# Patient Record
Sex: Male | Born: 1950 | Race: White | Hispanic: No | Marital: Married | Smoking: Former smoker
Health system: Southern US, Community
[De-identification: ages and names within clinical notes are randomized; demographics above are authoritative.]

## PROBLEM LIST (undated history)

## (undated) DIAGNOSIS — I10 Essential (primary) hypertension: Secondary | ICD-10-CM

## (undated) HISTORY — DX: Essential (primary) hypertension: I10

---

## 2011-09-04 LAB — HM COLONOSCOPY

## 2012-08-04 ENCOUNTER — Ambulatory Visit (INDEPENDENT_AMBULATORY_CARE_PROVIDER_SITE_OTHER): Payer: 59 | Admitting: Family Medicine

## 2012-08-04 VITALS — BP 122/80 | HR 56 | Temp 98.2°F | Resp 16 | Ht 69.0 in | Wt 215.8 lb

## 2012-08-04 DIAGNOSIS — H109 Unspecified conjunctivitis: Secondary | ICD-10-CM

## 2012-08-04 MED ORDER — OFLOXACIN 0.3 % OP SOLN: 1.0000 [drp] | Freq: Four times a day (QID) | OPHTHALMIC | Status: AC

## 2012-08-04 MED ORDER — OFLOXACIN 0.3 % OP SOLN: 1.0000 [drp] | Freq: Four times a day (QID) | OPHTHALMIC | Status: DC

## 2012-08-04 NOTE — Progress Notes (Signed)
Patient ID: Billy Fernandez, male   DOB: 1951-09-27, 61 y.o.   MRN: [id number] Billy Fernandez is a 61 y.o. male who presents to Urgent Care today for Red eye:  1.  Left eye redness:  Patient awoke this morning with pain in his Left eye.  Feeling "gritty" sensation in Left eye.  No eye pain.  No blurry vision.  Noticed redness that increased as day progressed.  No purulence that he has noticed, has had som clear drainage.  No injury to eye.  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    No recent illnesses.  Has history of pterygium removal at age 26.  Sees eye doctor regularly every year and tests negative for glaucoma.  Vision in Left eye is 20/50 at baseline and has been so for years.     PMH reviewed.  ROS as above otherwise neg.  No chest pain, palpitations, SOB, Fever, Chills, Abd pain, N/V/D.  Medications reviewed. Current Outpatient Prescriptions  Medication Sig Dispense Refill  . aspirin 81 MG tablet Take 81 mg by mouth daily.      Marland Kitchen atorvastatin (LIPITOR) 20 MG tablet Take 20 mg by mouth daily.      . fish oil-omega-3 fatty acids 1000 MG capsule Take 2 g by mouth daily.      . nebivolol (BYSTOLIC) 10 MG tablet Take 10 mg by mouth daily.      . valsartan (DIOVAN) 160 MG tablet Take 160 mg by mouth daily.      Marland Kitchen ofloxacin (OCUFLOX) 0.3 % ophthalmic solution Place 1 drop into the left eye 4 (four) times daily.  5 mL  0    Exam:  BP 122/80  Pulse 56  Temp 98.2 F (36.8 C) (Oral)  Resp 16  Ht 5\' 9"  (1.753 m)  Wt 215 lb 12.8 oz (97.886 kg)  BMI 31.87 kg/m2  SpO2 96% Gen:  Patient sitting on exam table, appears stated age in no acute distress Head: Normocephalic atraumatic Eyes: Right eye WNL, no redness noted.  PERRL, EOMI BL.  Left eye with erythema noted lateral aspect of eye.  Peri-limbal sparing.  VIsion test noted.  Concretions noted BL lower conjunctiva Nose:  Nasal turbinates grossly enlarged bilaterally. Some exudates noted. Tender to palpation of maxillary  sinus  Ears:  No pre-auricular nodes noted Mouth: Mucosa membranes moist. Tonsils +2, nonenlarged, non-erythematous. Neck: No cervical lymphadenopathy noted   Eye Exam:  Fluorescein examination negative for any foreign body or corneal abrasion.  Performed with Dr. Perrin Maltese.    Assessment and Plan:  1.  Conjunctivitis:  Likely viral conjunctivitis, although bacterial also a possibility.  Will cover with ofloxacin.  Instructions provided.  Not likely glaucoma due to no history, no eye pain, no changes in vision.  FU in 48 eyes with [place] or his eye doctor if no improvement, FU sooner if worsening.

## 2012-08-04 NOTE — Patient Instructions (Addendum)
You have conjunctivitis -- unclear at this time whether viral or bacterial. Insert 1-2 drops every 4 hours in your Left eye for the next 2 days, then 4 times a day for 5 days after that.  If you do not have any improvement in the next 48 hours come back and see [place] or go and see your eye doctor.    Conjunctivitis Conjunctivitis is commonly called "pink eye." Conjunctivitis can be caused by bacterial or viral infection, allergies, or injuries. There is usually redness of the lining of the eye, itching, discomfort, and sometimes discharge. There may be deposits of matter along the eyelids. A viral infection usually causes a watery discharge, while a bacterial infection causes a yellowish, thick discharge. Pink eye is very contagious and spreads by direct contact. You may be given antibiotic eyedrops as part of your treatment. Before using your eye medicine, remove all drainage from the eye by washing gently with warm water and cotton balls. Continue to use the medication until you have awakened 2 mornings in a row without discharge from the eye. Do not rub your eye. This increases the irritation and helps spread infection. Use separate towels from other household members. Wash your hands with soap and water before and after touching your eyes. Use cold compresses to reduce pain and sunglasses to relieve irritation from light. Do not wear contact lenses or wear eye makeup until the infection is gone. SEEK MEDICAL CARE IF:   Your symptoms are not better after 3 days of treatment.   You have increased pain or trouble seeing.   The outer eyelids become very red or swollen.  Document Released: 01/16/2005 Document Revised: 11/28/2011 Document Reviewed: 12/09/2005 [facility] Patient Information 2012 [place], [place].

## 2013-05-04 ENCOUNTER — Telehealth: Payer: Self-pay | Admitting: Internal Medicine

## 2013-05-04 NOTE — Telephone Encounter (Signed)
Received 16 pages from [facility], sent to Dr. Yetta Barre. 05/04/13/ss

## 2013-05-25 ENCOUNTER — Telehealth: Payer: Self-pay | Admitting: Internal Medicine

## 2013-05-25 NOTE — Telephone Encounter (Signed)
Rec'd from [facility] forward 43 pages to Dr.Jones

## 2013-05-28 ENCOUNTER — Other Ambulatory Visit (INDEPENDENT_AMBULATORY_CARE_PROVIDER_SITE_OTHER): Payer: BC Managed Care – PPO

## 2013-05-28 ENCOUNTER — Ambulatory Visit (INDEPENDENT_AMBULATORY_CARE_PROVIDER_SITE_OTHER): Payer: BC Managed Care – PPO | Admitting: Internal Medicine

## 2013-05-28 ENCOUNTER — Encounter: Payer: Self-pay | Admitting: Internal Medicine

## 2013-05-28 VITALS — BP 118/76 | HR 98 | Temp 98.1°F | Resp 16 | Ht 70.0 in | Wt 196.0 lb

## 2013-05-28 DIAGNOSIS — Z0000 Encounter for general adult medical examination without abnormal findings: Secondary | ICD-10-CM

## 2013-05-28 DIAGNOSIS — I10 Essential (primary) hypertension: Secondary | ICD-10-CM

## 2013-05-28 DIAGNOSIS — R6882 Decreased libido: Secondary | ICD-10-CM

## 2013-05-28 HISTORY — DX: Essential (primary) hypertension: I10

## 2013-05-28 LAB — LIPID PANEL
Cholesterol: 131 mg/dL (ref 0–200)
Triglycerides: 113 mg/dL (ref 0.0–149.0)
VLDL: 22.6 mg/dL (ref 0.0–40.0)

## 2013-05-28 LAB — CBC WITH DIFFERENTIAL/PLATELET
Basophils Relative: 0.7 % (ref 0.0–3.0)
Eosinophils Absolute: 0.7 10*3/uL (ref 0.0–0.7)
MCHC: 33.9 g/dL (ref 30.0–36.0)
MCV: 89.4 fl (ref 78.0–100.0)
Monocytes Absolute: 0.6 10*3/uL (ref 0.1–1.0)
Neutro Abs: 3.8 10*3/uL (ref 1.4–7.7)
Neutrophils Relative %: 53.4 % (ref 43.0–77.0)
RBC: 4.69 Mil/uL (ref 4.22–5.81)

## 2013-05-28 LAB — COMPREHENSIVE METABOLIC PANEL
AST: 24 U/L (ref 0–37)
Alkaline Phosphatase: 44 U/L (ref 39–117)
BUN: 19 mg/dL (ref 6–23)
Creatinine, Ser: 1 mg/dL (ref 0.4–1.5)
Potassium: 4.2 mEq/L (ref 3.5–5.1)

## 2013-05-28 LAB — VITAMIN B12: Vitamin B-12: 296 pg/mL (ref 211–911)

## 2013-05-28 LAB — TSH: TSH: 0.59 u[IU]/mL (ref 0.35–5.50)

## 2013-05-28 NOTE — Assessment & Plan Note (Signed)
His BP is well controlled 

## 2013-05-28 NOTE — Assessment & Plan Note (Signed)
Exam done Vaccines were reviewed Labs ordered Pt ed material was given 

## 2013-05-28 NOTE — Patient Instructions (Addendum)
Health Maintenance, Males A healthy lifestyle and preventative care can promote health and wellness.  Maintain regular health, dental, and eye exams.  Eat a healthy diet. Foods like vegetables, fruits, whole grains, low-fat dairy products, and lean protein foods contain the nutrients you need without too many calories. Decrease your intake of foods high in solid fats, added sugars, and salt. Get information about a proper diet from your caregiver, if necessary.  Regular physical exercise is one of the most important things you can do for your health. Most adults should get at least 150 minutes of moderate-intensity exercise (any activity that increases your heart rate and causes you to sweat) each week. In addition, most adults need muscle-strengthening exercises on 2 or more days a week.   Maintain a healthy weight. The body mass index (BMI) is a screening tool to identify possible weight problems. It provides an estimate of body fat based on height and weight. Your caregiver can help determine your BMI, and can help you achieve or maintain a healthy weight. For adults 20 years and older:  A BMI below 18.5 is considered underweight.  A BMI of 18.5 to 24.9 is normal.  A BMI of 25 to 29.9 is considered overweight.  A BMI of 30 and above is considered obese.  Maintain normal blood lipids and cholesterol by exercising and minimizing your intake of saturated fat. Eat a balanced diet with plenty of fruits and vegetables. Blood tests for lipids and cholesterol should begin at age 20 and be repeated every 5 years. If your lipid or cholesterol levels are high, you are over 50, or you are a high risk for heart disease, you may need your cholesterol levels checked more frequently.Ongoing high lipid and cholesterol levels should be treated with medicines, if diet and exercise are not effective.  If you smoke, find out from your caregiver how to quit. If you do not use tobacco, do not start.  If you  choose to drink alcohol, do not exceed 2 drinks per day. One drink is considered to be 12 ounces (355 mL) of beer, 5 ounces (148 mL) of wine, or 1.5 ounces (44 mL) of liquor.  Avoid use of street drugs. Do not share needles with anyone. Ask for help if you need support or instructions about stopping the use of drugs.  High blood pressure causes heart disease and increases the risk of stroke. Blood pressure should be checked at least every 1 to 2 years. Ongoing high blood pressure should be treated with medicines if weight loss and exercise are not effective.  If you are 45 to 61 years old, ask your caregiver if you should take aspirin to prevent heart disease.  Diabetes screening involves taking a blood sample to check your fasting blood sugar level. This should be done once every 3 years, after age 45, if you are within normal weight and without risk factors for diabetes. Testing should be considered at a younger age or be carried out more frequently if you are overweight and have at least 1 risk factor for diabetes.  Colorectal cancer can be detected and often prevented. Most routine colorectal cancer screening begins at the age of 50 and continues through age 75. However, your caregiver may recommend screening at an earlier age if you have risk factors for colon cancer. On a yearly basis, your caregiver may provide home test kits to check for hidden blood in the stool. Use of a small camera at the end of a tube,   to directly examine the colon (sigmoidoscopy or colonoscopy), can detect the earliest forms of colorectal cancer. Talk to your caregiver about this at age 50, when routine screening begins. Direct examination of the colon should be repeated every 5 to 10 years through age 75, unless early forms of pre-cancerous polyps or small growths are found.  Hepatitis C blood testing is recommended for all people born from 1945 through 1965 and any individual with known risks for hepatitis C.  Healthy  men should no longer receive prostate-specific antigen (PSA) blood tests as part of routine cancer screening. Consult with your caregiver about prostate cancer screening.  Testicular cancer screening is not recommended for adolescents or adult males who have no symptoms. Screening includes self-exam, caregiver exam, and other screening tests. Consult with your caregiver about any symptoms you have or any concerns you have about testicular cancer.  Practice safe sex. Use condoms and avoid high-risk sexual practices to reduce the spread of sexually transmitted infections (STIs).  Use sunscreen with a sun protection factor (SPF) of 30 or greater. Apply sunscreen liberally and repeatedly throughout the day. You should seek shade when your shadow is shorter than you. Protect yourself by wearing long sleeves, pants, a wide-brimmed hat, and sunglasses year round, whenever you are outdoors.  Notify your caregiver of new moles or changes in moles, especially if there is a change in shape or color. Also notify your caregiver if a mole is larger than the size of a pencil eraser.  A one-time screening for abdominal aortic aneurysm (AAA) and surgical repair of large AAAs by sound wave imaging (ultrasonography) is recommended for ages 65 to 75 years who are current or former smokers.  Stay current with your immunizations. Document Released: 06/06/2008 Document Revised: 03/02/2012 Document Reviewed: 05/06/2011 [employer] Patient Information 2014 [employer].  

## 2013-05-28 NOTE — Assessment & Plan Note (Signed)
Labs ordered.

## 2013-05-28 NOTE — Progress Notes (Signed)
  Subjective:    Patient ID: Billy Fernandez, male    DOB: [birth year], 62 y.o.   MRN: [id number]  HPI  New to me for a complete physical - his only complaint is low libido, he wants his T level checked.  Review of Systems  Constitutional: Negative.  Negative for fever, chills, diaphoresis, activity change, appetite change, fatigue and unexpected weight change.  HENT: Negative.   Eyes: Negative.   Respiratory: Negative.  Negative for apnea, cough, chest tightness, shortness of breath, wheezing and stridor.   Cardiovascular: Negative.  Negative for chest pain, palpitations and leg swelling.  Gastrointestinal: Negative.  Negative for nausea, vomiting, abdominal pain, diarrhea, constipation and blood in stool.  Endocrine: Negative.   Genitourinary: Negative.   Musculoskeletal: Negative for myalgias, back pain, joint swelling, arthralgias and gait problem.  Skin: Negative for color change, pallor, rash and wound.  Allergic/Immunologic: Negative.   Neurological: Negative.  Negative for dizziness and light-headedness.  Hematological: Negative.  Negative for adenopathy. Does not bruise/bleed easily.  Psychiatric/Behavioral: Negative.        Objective:   Physical Exam  Vitals reviewed. Constitutional: He is oriented to person, place, and time. He appears well-developed and well-nourished. No distress.  HENT:  Head: Normocephalic and atraumatic.  Mouth/Throat: Oropharynx is clear and moist. No oropharyngeal exudate.  Eyes: Conjunctivae are normal. Right eye exhibits no discharge. Left eye exhibits no discharge. No scleral icterus.  Neck: Normal range of motion. Neck supple. No JVD present. No tracheal deviation present. No thyromegaly present.  Cardiovascular: Normal rate, regular rhythm, normal heart sounds and intact distal pulses.  Exam reveals no gallop and no friction rub.   No murmur heard. Pulmonary/Chest: Effort normal and breath sounds normal. No stridor. No respiratory distress.  He has no wheezes. He has no rales. He exhibits no tenderness.  Abdominal: Soft. Bowel sounds are normal. He exhibits no distension and no mass. There is no tenderness. There is no rebound and no guarding. Hernia confirmed negative in the right inguinal area and confirmed negative in the left inguinal area.  Genitourinary: Rectum normal, prostate normal, testes normal and penis normal. Rectal exam shows no external hemorrhoid, no internal hemorrhoid, no fissure, no mass, no tenderness and anal tone normal. Guaiac negative stool. Prostate is not enlarged and not tender. Right testis shows no mass, no swelling and no tenderness. Right testis is descended. Left testis shows no mass, no swelling and no tenderness. Left testis is descended. Circumcised. No penile erythema or penile tenderness. No discharge found.  Musculoskeletal: Normal range of motion. He exhibits no edema and no tenderness.  Lymphadenopathy:    He has no cervical adenopathy.       Right: No inguinal adenopathy present.       Left: No inguinal adenopathy present.  Neurological: He is oriented to person, place, and time.  Skin: Skin is warm and dry. No rash noted. He is not diaphoretic. No erythema. No pallor.  Psychiatric: He has a normal mood and affect. His behavior is normal. Judgment and thought content normal.     No results found for this basename: WBC, HGB, HCT, PLT, GLUCOSE, CHOL, TRIG, HDL, LDLDIRECT, LDLCALC, ALT, AST, NA, K, CL, CREATININE, BUN, CO2, TSH, PSA, INR, GLUF, HGBA1C, MICROALBUR       Assessment & Plan:

## 2013-05-29 LAB — HEPATITIS C ANTIBODY: HCV Ab: NEGATIVE

## 2013-05-29 LAB — VITAMIN D 25 HYDROXY (VIT D DEFICIENCY, FRACTURES): Vit D, 25-Hydroxy: 45 ng/mL (ref 30–89)

## 2013-05-31 ENCOUNTER — Encounter: Payer: Self-pay | Admitting: Internal Medicine

## 2013-05-31 LAB — TESTOSTERONE, FREE, TOTAL, SHBG
Sex Hormone Binding: 86 nmol/L — ABNORMAL HIGH (ref 13–71)
Testosterone, Free: 51.8 pg/mL (ref 47.0–244.0)
Testosterone-% Free: 1 % — ABNORMAL LOW (ref 1.6–2.9)

## 2013-06-29 ENCOUNTER — Encounter: Payer: Self-pay | Admitting: Internal Medicine

## 2013-06-29 ENCOUNTER — Ambulatory Visit (INDEPENDENT_AMBULATORY_CARE_PROVIDER_SITE_OTHER): Payer: BC Managed Care – PPO | Admitting: Internal Medicine

## 2013-06-29 VITALS — BP 132/88 | HR 51 | Temp 98.1°F | Resp 16 | Wt 194.5 lb

## 2013-06-29 DIAGNOSIS — L309 Dermatitis, unspecified: Secondary | ICD-10-CM | POA: Insufficient documentation

## 2013-06-29 DIAGNOSIS — E785 Hyperlipidemia, unspecified: Secondary | ICD-10-CM | POA: Insufficient documentation

## 2013-06-29 DIAGNOSIS — L259 Unspecified contact dermatitis, unspecified cause: Secondary | ICD-10-CM

## 2013-06-29 HISTORY — DX: Hyperlipidemia, unspecified: E78.5

## 2013-06-29 MED ORDER — TRIAMCINOLONE ACETONIDE 0.5 % EX CREA: TOPICAL_CREAM | Freq: Three times a day (TID) | CUTANEOUS | Status: DC

## 2013-06-29 MED ORDER — ATORVASTATIN CALCIUM 20 MG PO TABS: 20.0000 mg | ORAL_TABLET | Freq: Every day | ORAL | Status: DC

## 2013-06-29 MED ORDER — CETIRIZINE HCL 10 MG PO TABS: 10.0000 mg | ORAL_TABLET | Freq: Every day | ORAL | Status: DC

## 2013-06-29 NOTE — Progress Notes (Signed)
  Subjective:    Patient ID: Billy Fernandez, male    DOB: Jun 29, 1951, 62 y.o.   MRN: [id number]  Rash This is a new problem. The current episode started 1 to 4 weeks ago. The problem is unchanged. The affected locations include the torso. The rash is characterized by peeling, itchiness and dryness. He was exposed to nothing. Pertinent negatives include no anorexia, congestion, cough, diarrhea, eye pain, facial edema, fatigue, fever, joint pain, nail changes, rhinorrhea, shortness of breath or vomiting. Past treatments include nothing. The treatment provided no relief.      Review of Systems  Constitutional: Negative.  Negative for fever and fatigue.  HENT: Negative.  Negative for congestion and rhinorrhea.   Eyes: Negative for pain.  Respiratory: Negative for cough, chest tightness and shortness of breath.   Cardiovascular: Negative.   Gastrointestinal: Negative.  Negative for vomiting, abdominal pain, diarrhea and anorexia.  Endocrine: Negative.   Genitourinary: Negative.   Musculoskeletal: Negative.  Negative for joint pain.  Skin: Positive for rash. Negative for nail changes, color change, pallor and wound.  Allergic/Immunologic: Negative.   Neurological: Negative.   Hematological: Negative.   Psychiatric/Behavioral: Negative.        Objective:   Physical Exam  Vitals reviewed. Constitutional: He appears well-developed and well-nourished. No distress.  HENT:  Head: Normocephalic and atraumatic.  Mouth/Throat: No oropharyngeal exudate.  Eyes: Conjunctivae are normal. Right eye exhibits no discharge. Left eye exhibits no discharge. No scleral icterus.  Neck: Normal range of motion. Neck supple. No JVD present. No tracheal deviation present. No thyromegaly present.  Cardiovascular: Normal rate, regular rhythm, normal heart sounds and intact distal pulses.  Exam reveals no gallop and no friction rub.   No murmur heard. Pulmonary/Chest: Effort normal and breath sounds normal. No  stridor. No respiratory distress. He has no wheezes. He has no rales. He exhibits no tenderness.  Abdominal: Soft. Bowel sounds are normal. He exhibits no distension and no mass. There is no tenderness. There is no rebound and no guarding.  Musculoskeletal: Normal range of motion. He exhibits no edema and no tenderness.  Lymphadenopathy:    He has no cervical adenopathy.  Skin: Skin is warm, dry and intact. Rash noted. No abrasion, no bruising, no burn, no ecchymosis, no laceration, no lesion, no petechiae and no purpura noted. Rash is papular. Rash is not macular, not maculopapular, not nodular, not pustular, not vesicular and not urticarial. He is not diaphoretic. No cyanosis or erythema. No pallor. Nails show no clubbing.     The background skin shows diffuse excessive tan with mild sunburn and peeling.  Psychiatric: He has a normal mood and affect. His behavior is normal. Judgment and thought content normal.     Lab Results  Component Value Date   WBC 7.2 05/28/2013   HGB 14.2 05/28/2013   HCT 42.0 05/28/2013   PLT 183.0 05/28/2013   GLUCOSE 115* 05/28/2013   CHOL 131 05/28/2013   TRIG 113.0 05/28/2013   HDL 44.50 05/28/2013   LDLCALC 64 05/28/2013   ALT 25 05/28/2013   AST 24 05/28/2013   NA 141 05/28/2013   K 4.2 05/28/2013   CL 107 05/28/2013   CREATININE 1.0 05/28/2013   BUN 19 05/28/2013   CO2 29 05/28/2013   TSH 0.59 05/28/2013   HGBA1C 5.7 05/28/2013       Assessment & Plan:

## 2013-06-29 NOTE — Patient Instructions (Signed)

## 2013-06-30 ENCOUNTER — Encounter: Payer: Self-pay | Admitting: Internal Medicine

## 2013-06-30 NOTE — Assessment & Plan Note (Signed)
He is doing well on lipitor 

## 2013-06-30 NOTE — Assessment & Plan Note (Signed)
I have asked him to avoid excessive sun exposure Will treat the rash and allergy with zyrtec and TAC cream

## 2013-11-26 ENCOUNTER — Ambulatory Visit (INDEPENDENT_AMBULATORY_CARE_PROVIDER_SITE_OTHER): Payer: BC Managed Care – PPO | Admitting: Internal Medicine

## 2013-11-26 ENCOUNTER — Other Ambulatory Visit (INDEPENDENT_AMBULATORY_CARE_PROVIDER_SITE_OTHER): Payer: BC Managed Care – PPO

## 2013-11-26 ENCOUNTER — Encounter: Payer: Self-pay | Admitting: Internal Medicine

## 2013-11-26 VITALS — BP 120/80 | HR 68 | Temp 97.8°F | Resp 16 | Ht 70.0 in | Wt 191.0 lb

## 2013-11-26 DIAGNOSIS — I10 Essential (primary) hypertension: Secondary | ICD-10-CM

## 2013-11-26 LAB — BASIC METABOLIC PANEL
BUN: 19 mg/dL (ref 6–23)
Calcium: 9.4 mg/dL (ref 8.4–10.5)
GFR: 82.42 mL/min (ref >60.00)
Glucose, Bld: 158 mg/dL — ABNORMAL HIGH (ref 70–99)

## 2013-11-26 MED ORDER — CARVEDILOL 6.25 MG PO TABS: 6.2500 mg | ORAL_TABLET | Freq: Two times a day (BID) | ORAL | Status: DC

## 2013-11-26 MED ORDER — LOSARTAN POTASSIUM-HCTZ 50-12.5 MG PO TABS: 1.0000 | ORAL_TABLET | Freq: Every day | ORAL | Status: DC

## 2013-11-26 NOTE — Patient Instructions (Signed)

## 2013-11-26 NOTE — Progress Notes (Signed)
Pre visit review using our clinic review tool, if applicable. No additional management support is needed unless otherwise documented below in the visit note. 

## 2013-11-26 NOTE — Progress Notes (Signed)
   Subjective:    Patient ID: Billy Fernandez, male    DOB: Jul 23, 1951, 62 y.o.   MRN: [id number]  Hypertension This is a chronic problem. The current episode started more than 1 year ago. The problem has been gradually improving since onset. The problem is controlled. Pertinent negatives include no anxiety, blurred vision, chest pain, headaches, malaise/fatigue, neck pain, orthopnea, palpitations, peripheral edema, PND, shortness of breath or sweats. There are no associated agents to hypertension. Past treatments include angiotensin blockers, beta blockers and diuretics. The current treatment provides significant improvement. There are no compliance problems.       Review of Systems  Constitutional: Negative for malaise/fatigue.  Eyes: Negative for blurred vision.  Respiratory: Negative for shortness of breath.   Cardiovascular: Negative for chest pain, palpitations, orthopnea and PND.  Musculoskeletal: Negative for neck pain.  Neurological: Negative for headaches.  All other systems reviewed and are negative.       Objective:   Physical Exam  Vitals reviewed. Constitutional: He is oriented to person, place, and time. He appears well-developed and well-nourished. No distress.  HENT:  Head: Normocephalic and atraumatic.  Mouth/Throat: Oropharynx is clear and moist. No oropharyngeal exudate.  Eyes: Conjunctivae are normal. Right eye exhibits no discharge. Left eye exhibits no discharge. No scleral icterus.  Neck: Normal range of motion. Neck supple. No JVD present. No tracheal deviation present. No thyromegaly present.  Cardiovascular: Normal rate, regular rhythm, normal heart sounds and intact distal pulses.  Exam reveals no gallop and no friction rub.   No murmur heard. Pulmonary/Chest: Effort normal and breath sounds normal. No stridor. No respiratory distress. He has no wheezes. He has no rales. He exhibits no tenderness.  Abdominal: Soft. Bowel sounds are normal. He exhibits no  distension and no mass. There is no tenderness. There is no rebound and no guarding.  Musculoskeletal: Normal range of motion. He exhibits no edema and no tenderness.  Lymphadenopathy:    He has no cervical adenopathy.  Neurological: He is oriented to person, place, and time.  Skin: Skin is warm and dry. No rash noted. He is not diaphoretic. No erythema. No pallor.  Psychiatric: He has a normal mood and affect. His behavior is normal. Judgment and thought content normal.     Lab Results  Component Value Date   WBC 7.2 05/28/2013   HGB 14.2 05/28/2013   HCT 42.0 05/28/2013   PLT 183.0 05/28/2013   GLUCOSE 115* 05/28/2013   CHOL 131 05/28/2013   TRIG 113.0 05/28/2013   HDL 44.50 05/28/2013   LDLCALC 64 05/28/2013   ALT 25 05/28/2013   AST 24 05/28/2013   NA 141 05/28/2013   K 4.2 05/28/2013   CL 107 05/28/2013   CREATININE 1.0 05/28/2013   BUN 19 05/28/2013   CO2 29 05/28/2013   TSH 0.59 05/28/2013   HGBA1C 5.7 05/28/2013       Assessment & Plan:

## 2013-11-28 ENCOUNTER — Encounter: Payer: Self-pay | Admitting: Internal Medicine

## 2013-11-28 NOTE — Assessment & Plan Note (Signed)
His BP is well controlled His lytes and renal function are normal He will stay on the current regimen

## 2014-05-17 ENCOUNTER — Other Ambulatory Visit: Payer: Self-pay | Admitting: Internal Medicine

## 2014-07-01 ENCOUNTER — Encounter: Payer: Self-pay | Admitting: Internal Medicine

## 2014-07-01 ENCOUNTER — Ambulatory Visit (INDEPENDENT_AMBULATORY_CARE_PROVIDER_SITE_OTHER): Payer: BC Managed Care – PPO | Admitting: Internal Medicine

## 2014-07-01 VITALS — BP 130/84 | HR 66 | Temp 98.3°F | Resp 16 | Ht 70.0 in | Wt 207.8 lb

## 2014-07-01 DIAGNOSIS — Z0000 Encounter for general adult medical examination without abnormal findings: Secondary | ICD-10-CM

## 2014-07-01 DIAGNOSIS — I10 Essential (primary) hypertension: Secondary | ICD-10-CM

## 2014-07-01 DIAGNOSIS — E119 Type 2 diabetes mellitus without complications: Secondary | ICD-10-CM

## 2014-07-01 DIAGNOSIS — R7309 Other abnormal glucose: Secondary | ICD-10-CM

## 2014-07-01 DIAGNOSIS — H00029 Hordeolum internum unspecified eye, unspecified eyelid: Secondary | ICD-10-CM

## 2014-07-01 DIAGNOSIS — H00023 Hordeolum internum right eye, unspecified eyelid: Secondary | ICD-10-CM | POA: Insufficient documentation

## 2014-07-01 HISTORY — DX: Type 2 diabetes mellitus without complications: E11.9

## 2014-07-01 LAB — FECAL OCCULT BLOOD, GUAIAC: FECAL OCCULT BLD: NEGATIVE

## 2014-07-01 MED ORDER — TOBRAMYCIN-DEXAMETHASONE 0.3-0.1 % OP OINT: 1.0000 "application " | TOPICAL_OINTMENT | Freq: Three times a day (TID) | OPHTHALMIC | Status: DC

## 2014-07-01 NOTE — Assessment & Plan Note (Signed)
I will check his A1C to see if he has developed DM2 

## 2014-07-01 NOTE — Patient Instructions (Signed)
Health Maintenance A healthy lifestyle and preventative care can promote health and wellness.  Maintain regular health, dental, and eye exams.  Eat a healthy diet. Foods like vegetables, fruits, whole grains, low-fat dairy products, and lean protein foods contain the nutrients you need and are low in calories. Decrease your intake of foods high in solid fats, added sugars, and salt. Get information about a proper diet from your health care provider, if necessary.  Regular physical exercise is one of the most important things you can do for your health. Most adults should get at least 150 minutes of moderate-intensity exercise (any activity that increases your heart rate and causes you to sweat) each week. In addition, most adults need muscle-strengthening exercises on 2 or more days a week.   Maintain a healthy weight. The body mass index (BMI) is a screening tool to identify possible weight problems. It provides an estimate of body fat based on height and weight. Your health care provider can find your BMI and can help you achieve or maintain a healthy weight. For males 20 years and older:  A BMI below 18.5 is considered underweight.  A BMI of 18.5 to 24.9 is normal.  A BMI of 25 to 29.9 is considered overweight.  A BMI of 30 and above is considered obese.  Maintain normal blood lipids and cholesterol by exercising and minimizing your intake of saturated fat. Eat a balanced diet with plenty of fruits and vegetables. Blood tests for lipids and cholesterol should begin at age 20 and be repeated every 5 years. If your lipid or cholesterol levels are high, you are over age 50, or you are at high risk for heart disease, you may need your cholesterol levels checked more frequently.Ongoing high lipid and cholesterol levels should be treated with medicines if diet and exercise are not working.  If you smoke, find out from your health care provider how to quit. If you do not use tobacco, do not  start.  Lung cancer screening is recommended for adults aged 55-80 years who are at high risk for developing lung cancer because of a history of smoking. A yearly low-dose CT scan of the lungs is recommended for people who have at least a 30-pack-year history of smoking and are current smokers or have quit within the past 15 years. A pack year of smoking is smoking an average of 1 pack of cigarettes a day for 1 year (for example, a 30-pack-year history of smoking could mean smoking 1 pack a day for 30 years or 2 packs a day for 15 years). Yearly screening should continue until the smoker has stopped smoking for at least 15 years. Yearly screening should be stopped for people who develop a health problem that would prevent them from having lung cancer treatment.  If you choose to drink alcohol, do not have more than 2 drinks per day. One drink is considered to be 12 oz (360 mL) of beer, 5 oz (150 mL) of wine, or 1.5 oz (45 mL) of liquor.  Avoid the use of street drugs. Do not share needles with anyone. Ask for help if you need support or instructions about stopping the use of drugs.  High blood pressure causes heart disease and increases the risk of stroke. Blood pressure should be checked at least every 1-2 years. Ongoing high blood pressure should be treated with medicines if weight loss and exercise are not effective.  If you are 45-79 years old, ask your health care provider if   you should take aspirin to prevent heart disease.  Diabetes screening involves taking a blood sample to check your fasting blood sugar level. This should be done once every 3 years after age 45 if you are at a normal weight and without risk factors for diabetes. Testing should be considered at a younger age or be carried out more frequently if you are overweight and have at least 1 risk factor for diabetes.  Colorectal cancer can be detected and often prevented. Most routine colorectal cancer screening begins at the age of 50  and continues through age 75. However, your health care provider may recommend screening at an earlier age if you have risk factors for colon cancer. On a yearly basis, your health care provider may provide home test kits to check for hidden blood in the stool. A small camera at the end of a tube may be used to directly examine the colon (sigmoidoscopy or colonoscopy) to detect the earliest forms of colorectal cancer. Talk to your health care provider about this at age 50 when routine screening begins. A direct exam of the colon should be repeated every 5-10 years through age 75, unless early forms of precancerous polyps or small growths are found.  People who are at an increased risk for hepatitis B should be screened for this virus. You are considered at high risk for hepatitis B if:  You were born in a country where hepatitis B occurs often. Talk with your health care provider about which countries are considered high risk.  Your parents were born in a high-risk country and you have not received a shot to protect against hepatitis B (hepatitis B vaccine).  You have HIV or AIDS.  You use needles to inject street drugs.  You live with, or have sex with, someone who has hepatitis B.  You are a man who has sex with other men (MSM).  You get hemodialysis treatment.  You take certain medicines for conditions like cancer, organ transplantation, and autoimmune conditions.  Hepatitis C blood testing is recommended for all people born from 1945 through 1965 and any individual with known risk factors for hepatitis C.  Healthy men should no longer receive prostate-specific antigen (PSA) blood tests as part of routine cancer screening. Talk to your health care provider about prostate cancer screening.  Testicular cancer screening is not recommended for adolescents or adult males who have no symptoms. Screening includes self-exam, a health care provider exam, and other screening tests. Consult with your  health care provider about any symptoms you have or any concerns you have about testicular cancer.  Practice safe sex. Use condoms and avoid high-risk sexual practices to reduce the spread of sexually transmitted infections (STIs).  You should be screened for STIs, including gonorrhea and chlamydia if:  You are sexually active and are younger than 24 years.  You are older than 24 years, and your health care provider tells you that you are at risk for this type of infection.  Your sexual activity has changed since you were last screened, and you are at an increased risk for chlamydia or gonorrhea. Ask your health care provider if you are at risk.  If you are at risk of being infected with HIV, it is recommended that you take a prescription medicine daily to prevent HIV infection. This is called pre-exposure prophylaxis (PrEP). You are considered at risk if:  You are a man who has sex with other men (MSM).  You are a heterosexual man who   is sexually active with multiple partners.  You take drugs by injection.  You are sexually active with a partner who has HIV.  Talk with your health care provider about whether you are at high risk of being infected with HIV. If you choose to begin PrEP, you should first be tested for HIV. You should then be tested every 3 months for as long as you are taking PrEP.  Use sunscreen. Apply sunscreen liberally and repeatedly throughout the day. You should seek shade when your shadow is shorter than you. Protect yourself by wearing long sleeves, pants, a wide-brimmed hat, and sunglasses year round whenever you are outdoors.  Tell your health care provider of new moles or changes in moles, especially if there is a change in shape or color. Also, tell your health care provider if a mole is larger than the size of a pencil eraser.  A one-time screening for abdominal aortic aneurysm (AAA) and surgical repair of large AAAs by ultrasound is recommended for men aged  29-75 years who are current or former smokers.  Stay current with your vaccines (immunizations). Document Released: 06/06/2008 Document Revised: 12/14/2013 Document Reviewed: 05/06/2011 [facility] Patient Information 2015 [place], [place]. This information is not intended to replace advice given to you by your health care provider. Make sure you discuss any questions you have with your health care provider. Sty A sty (hordeolum) is an infection of a gland in the eyelid located at the base of the eyelash. A sty may develop a white or yellow head of pus. It can be puffy (swollen). Usually, the sty will burst and pus will come out on its own. They do not leave lumps in the eyelid once they drain. A sty is often confused with another form of cyst of the eyelid called a chalazion. Chalazions occur within the eyelid and not on the edge where the bases of the eyelashes are. They often are red, sore and then form firm lumps in the eyelid. CAUSES   Germs (bacteria).  Lasting (chronic) eyelid inflammation. SYMPTOMS   Tenderness, redness and swelling along the edge of the eyelid at the base of the eyelashes.  Sometimes, there is a white or yellow head of pus. It may or may not drain. DIAGNOSIS  An ophthalmologist will be able to distinguish between a sty and a chalazion and treat the condition appropriately.  TREATMENT   Styes are typically treated with warm packs (compresses) until drainage occurs.  In rare cases, medicines that kill germs (antibiotics) may be prescribed. These antibiotics may be in the form of drops, cream or pills.  If a hard lump has formed, it is generally necessary to do a small incision and remove the hardened contents of the cyst in a minor surgical procedure done in the office.  In suspicious cases, your caregiver may send the contents of the cyst to the lab to be certain that it is not a rare, but dangerous form of cancer of the glands of the eyelid. HOME CARE INSTRUCTIONS    Wash your hands often and dry them with a clean towel. Avoid touching your eyelid. This may spread the infection to other parts of the eye.  Apply heat to your eyelid for 10 to 20 minutes, several times a day, to ease pain and help to heal it faster.  Do not squeeze the sty. Allow it to drain on its own. Wash your eyelid carefully 3 to 4 times per day to remove any pus. [place]  CARE IF:   Your eye becomes painful or puffy (swollen).  Your vision changes.  Your sty does not drain by itself within 3 days.  Your sty comes back within a short period of time, even with treatment.  You have redness (inflammation) around the eye.  You have a fever. Document Released: 09/18/2005 Document Revised: 03/02/2012 Document Reviewed: 05/23/2009 [facility] Patient Information 2015 [place], [place]. This information is not intended to replace advice given to you by your health care provider. Make sure you discuss any questions you have with your health care provider.

## 2014-07-01 NOTE — Assessment & Plan Note (Signed)
Pt ed material was given Will treat with tobradex ointment

## 2014-07-01 NOTE — Assessment & Plan Note (Signed)
His BP is well controlled 

## 2014-07-01 NOTE — Progress Notes (Signed)
Subjective:    Patient ID: Billy Fernandez, male    DOB: 05-19-1951, 63 y.o.   MRN: [id number]  Conjunctivitis  The current episode started 5 to 7 days ago. The onset was gradual. The problem occurs rarely. The problem has been unchanged. The problem is mild. Nothing relieves the symptoms. Associated symptoms include eye redness. Pertinent negatives include no fever, no decreased vision, no double vision, no eye itching, no photophobia, no abdominal pain, no constipation, no diarrhea, no nausea, no congestion, no ear discharge, no ear pain, no hearing loss, no mouth sores, no rhinorrhea, no sore throat, no stridor, no muscle aches, no neck pain, no neck stiffness, no cough, no URI, no wheezing, no rash, no eye discharge and no eye pain.      Review of Systems  Constitutional: Positive for unexpected weight change (wt gain). Negative for fever, chills, diaphoresis, activity change, appetite change and fatigue.  HENT: Negative.  Negative for congestion, ear discharge, ear pain, hearing loss, mouth sores, rhinorrhea and sore throat.   Eyes: Positive for redness. Negative for double vision, photophobia, pain, discharge and itching.  Respiratory: Negative.  Negative for cough, choking, chest tightness, shortness of breath, wheezing and stridor.   Cardiovascular: Negative.  Negative for chest pain, palpitations and leg swelling.  Gastrointestinal: Negative.  Negative for nausea, abdominal pain, diarrhea, constipation and blood in stool.  Endocrine: Negative.  Negative for polydipsia, polyphagia and polyuria.  Genitourinary: Negative.   Musculoskeletal: Negative.  Negative for neck pain.  Skin: Negative.  Negative for rash.  Allergic/Immunologic: Negative.   Neurological: Negative.   Hematological: Negative.  Negative for adenopathy. Does not bruise/bleed easily.  Psychiatric/Behavioral: Negative.        Objective:   Physical Exam  Vitals reviewed. Constitutional: He is oriented to person,  place, and time. He appears well-developed and well-nourished. No distress.  HENT:  Head: Normocephalic and atraumatic.  Mouth/Throat: Oropharynx is clear and moist. No oropharyngeal exudate.  Eyes: Conjunctivae and EOM are normal. Lids are everted and swept, no foreign bodies found. Right eye exhibits hordeolum (rt int hordeolum). Right eye exhibits no chemosis, no discharge and no exudate. No foreign body present in the right eye. Left eye exhibits no chemosis, no discharge, no exudate and no hordeolum. No foreign body present in the left eye. Right conjunctiva is not injected. Left conjunctiva has no hemorrhage. No scleral icterus. Right eye exhibits normal extraocular motion and no nystagmus. Left eye exhibits normal extraocular motion and no nystagmus. Right pupil is round and reactive. Left pupil is round and reactive. Pupils are equal.  Neck: Normal range of motion. Neck supple. No JVD present. No tracheal deviation present. No thyromegaly present.  Cardiovascular: Normal rate, regular rhythm, normal heart sounds and intact distal pulses.  Exam reveals no gallop and no friction rub.   No murmur heard. Pulmonary/Chest: Effort normal and breath sounds normal. No stridor. No respiratory distress. He has no wheezes. He has no rales. He exhibits no tenderness.  Abdominal: Soft. Bowel sounds are normal. He exhibits no distension and no mass. There is no tenderness. There is no rebound and no guarding. Hernia confirmed negative in the right inguinal area and confirmed negative in the left inguinal area.  Genitourinary: Rectum normal, prostate normal, testes normal and penis normal. Rectal exam shows no external hemorrhoid, no internal hemorrhoid, no fissure, no mass, no tenderness and anal tone normal. Guaiac negative stool. Prostate is not enlarged and not tender. Right testis shows no mass, no swelling and  no tenderness. Right testis is descended. Left testis shows no mass, no swelling and no tenderness.  Left testis is descended. Circumcised. No penile erythema or penile tenderness. No discharge found.  Musculoskeletal: He exhibits no edema and no tenderness.  Lymphadenopathy:    He has no cervical adenopathy.       Right: No inguinal adenopathy present.       Left: No inguinal adenopathy present.  Neurological: He is oriented to person, place, and time.  Skin: Skin is warm and dry. No rash noted. He is not diaphoretic. No erythema. No pallor.  Psychiatric: He has a normal mood and affect. His behavior is normal. Judgment and thought content normal.          Assessment & Plan:

## 2014-07-01 NOTE — Assessment & Plan Note (Signed)
Exam done Vaccines were reviewed Labs ordered Pt ed material was given 

## 2014-07-01 NOTE — Progress Notes (Signed)
Pre visit review using our clinic review tool, if applicable. No additional management support is needed unless otherwise documented below in the visit note. 

## 2014-07-04 ENCOUNTER — Telehealth: Payer: Self-pay | Admitting: Internal Medicine

## 2014-07-04 NOTE — Telephone Encounter (Signed)
Relevant patient education mailed to patient.  

## 2014-08-12 ENCOUNTER — Other Ambulatory Visit (INDEPENDENT_AMBULATORY_CARE_PROVIDER_SITE_OTHER): Payer: BC Managed Care – PPO

## 2014-08-12 ENCOUNTER — Encounter: Payer: Self-pay | Admitting: Internal Medicine

## 2014-08-12 ENCOUNTER — Ambulatory Visit: Payer: BC Managed Care – PPO

## 2014-08-12 DIAGNOSIS — Z0000 Encounter for general adult medical examination without abnormal findings: Secondary | ICD-10-CM

## 2014-08-12 DIAGNOSIS — R7309 Other abnormal glucose: Secondary | ICD-10-CM

## 2014-08-12 LAB — COMPREHENSIVE METABOLIC PANEL WITH GFR
ALT: 28 U/L (ref 0–53)
AST: 22 U/L (ref 0–37)
Albumin: 3.8 g/dL (ref 3.5–5.2)
Alkaline Phosphatase: 33 U/L — ABNORMAL LOW (ref 39–117)
BUN: 20 mg/dL (ref 6–23)
CO2: 31 meq/L (ref 19–32)
Calcium: 9.4 mg/dL (ref 8.4–10.5)
Chloride: 103 meq/L (ref 96–112)
Creatinine, Ser: 1 mg/dL (ref 0.4–1.5)
GFR: 82.22 mL/min
Glucose, Bld: 127 mg/dL — ABNORMAL HIGH (ref 70–99)
Potassium: 4.5 meq/L (ref 3.5–5.1)
Sodium: 141 meq/L (ref 135–145)
Total Bilirubin: 0.9 mg/dL (ref 0.2–1.2)
Total Protein: 6.8 g/dL (ref 6.0–8.3)

## 2014-08-12 LAB — CBC WITH DIFFERENTIAL/PLATELET
Basophils Absolute: 0 10*3/uL (ref 0.0–0.1)
Basophils Relative: 0.4 % (ref 0.0–3.0)
EOS PCT: 2.4 % (ref 0.0–5.0)
Eosinophils Absolute: 0.1 10*3/uL (ref 0.0–0.7)
HCT: 48.2 % (ref 39.0–52.0)
Hemoglobin: 16.2 g/dL (ref 13.0–17.0)
Lymphocytes Relative: 30.9 % (ref 12.0–46.0)
Lymphs Abs: 1.8 10*3/uL (ref 0.7–4.0)
MCHC: 33.7 g/dL (ref 30.0–36.0)
MCV: 90 fl (ref 78.0–100.0)
MONOS PCT: 10.2 % (ref 3.0–12.0)
Monocytes Absolute: 0.6 10*3/uL (ref 0.1–1.0)
NEUTROS PCT: 56.1 % (ref 43.0–77.0)
Neutro Abs: 3.3 10*3/uL (ref 1.4–7.7)
PLATELETS: 210 10*3/uL (ref 150.0–400.0)
RBC: 5.36 Mil/uL (ref 4.22–5.81)
RDW: 12.9 % (ref 11.5–15.5)
WBC: 5.9 10*3/uL (ref 4.0–10.5)

## 2014-08-12 LAB — URINALYSIS, ROUTINE W REFLEX MICROSCOPIC
Bilirubin Urine: NEGATIVE
Hgb urine dipstick: NEGATIVE
Ketones, ur: NEGATIVE
Leukocytes, UA: NEGATIVE
Nitrite: NEGATIVE
Specific Gravity, Urine: 1.02
Total Protein, Urine: NEGATIVE
Urine Glucose: NEGATIVE
Urobilinogen, UA: 0.2
pH: 6 (ref 5.0–8.0)

## 2014-08-12 LAB — TSH: TSH: 1.1 u[IU]/mL (ref 0.35–4.50)

## 2014-08-12 LAB — PSA: PSA: 0.87 ng/mL (ref 0.10–4.00)

## 2014-08-12 LAB — HEMOGLOBIN A1C: HEMOGLOBIN A1C: 7.2 % — AB (ref 4.6–6.5)

## 2014-11-29 ENCOUNTER — Telehealth: Payer: Self-pay | Admitting: Internal Medicine

## 2014-11-29 NOTE — Telephone Encounter (Signed)
Pt's spouse, Sharyn Lull, is asking if Dr. Ronnald Ramp would please put in lab work prior to Mr. Weatherall appt on 12/30/14. Pt is having a hard time getting off work and she is trying to get him to see the doctor because of his blood sugar is up. Inform her that Dr. Ronnald Ramp does blood work after the appt. Please advise, she is really hopping that Dr. Ronnald Ramp would be willing to put in lab work prior to the appt.

## 2014-12-13 ENCOUNTER — Other Ambulatory Visit: Payer: Self-pay | Admitting: Internal Medicine

## 2014-12-13 ENCOUNTER — Other Ambulatory Visit: Payer: Self-pay | Admitting: Geriatric Medicine

## 2014-12-13 ENCOUNTER — Telehealth: Payer: Self-pay | Admitting: Internal Medicine

## 2014-12-13 MED ORDER — LOSARTAN POTASSIUM-HCTZ 50-12.5 MG PO TABS: 1.0000 | ORAL_TABLET | Freq: Every day | ORAL | Status: DC

## 2014-12-13 MED ORDER — ATORVASTATIN CALCIUM 20 MG PO TABS: 20.0000 mg | ORAL_TABLET | Freq: Every day | ORAL | Status: DC

## 2014-12-13 MED ORDER — CARVEDILOL 6.25 MG PO TABS: 6.2500 mg | ORAL_TABLET | Freq: Two times a day (BID) | ORAL | Status: DC

## 2014-12-13 NOTE — Telephone Encounter (Signed)
Pt request refill for Carvedilol, Losartan and Atorvastatin to be send to [facility] for 3 month supply. Please advise.

## 2014-12-30 ENCOUNTER — Encounter: Payer: Self-pay | Admitting: Internal Medicine

## 2014-12-30 ENCOUNTER — Ambulatory Visit (INDEPENDENT_AMBULATORY_CARE_PROVIDER_SITE_OTHER): Payer: BLUE CROSS/BLUE SHIELD | Admitting: Internal Medicine

## 2014-12-30 ENCOUNTER — Other Ambulatory Visit (INDEPENDENT_AMBULATORY_CARE_PROVIDER_SITE_OTHER): Payer: BLUE CROSS/BLUE SHIELD

## 2014-12-30 VITALS — BP 120/84 | HR 58 | Temp 98.1°F | Resp 16 | Ht 70.0 in | Wt 211.0 lb

## 2014-12-30 DIAGNOSIS — R739 Hyperglycemia, unspecified: Secondary | ICD-10-CM

## 2014-12-30 DIAGNOSIS — E119 Type 2 diabetes mellitus without complications: Secondary | ICD-10-CM

## 2014-12-30 DIAGNOSIS — I10 Essential (primary) hypertension: Secondary | ICD-10-CM

## 2014-12-30 LAB — BASIC METABOLIC PANEL
BUN: 15 mg/dL (ref 6–23)
CALCIUM: 9.4 mg/dL (ref 8.4–10.5)
CHLORIDE: 103 meq/L (ref 96–112)
CO2: 29 meq/L (ref 19–32)
CREATININE: 1.2 mg/dL (ref 0.4–1.5)
GFR: 66.28 mL/min (ref >60.00)
Glucose, Bld: 104 mg/dL — ABNORMAL HIGH (ref 70–99)
Potassium: 4 mEq/L (ref 3.5–5.1)
SODIUM: 140 meq/L (ref 135–145)

## 2014-12-30 LAB — HEMOGLOBIN A1C: Hgb A1c MFr Bld: 7.1 % — ABNORMAL HIGH (ref 4.6–6.5)

## 2014-12-30 NOTE — Patient Instructions (Signed)

## 2015-01-01 NOTE — Progress Notes (Signed)
Subjective:    Patient ID: Billy Fernandez, male    DOB: 1951/08/17, 64 y.o.   MRN: [id number]  Diabetes He presents for his follow-up diabetic visit. He has type 2 diabetes mellitus. His disease course has been improving. There are no diabetic associated symptoms. Pertinent negatives for diabetes include no blurred vision, no chest pain, no fatigue, no foot paresthesias, no foot ulcerations, no polydipsia, no polyphagia, no polyuria, no visual change, no weakness and no weight loss. Current diabetic treatment includes diet. He is compliant with treatment most of the time. His weight is stable. He is following a generally healthy diet. Meal planning includes avoidance of concentrated sweets. He participates in exercise three times a week. There is no change in his home blood glucose trend. An ACE inhibitor/angiotensin II receptor blocker is being taken. He does not see a podiatrist.Eye exam is current.      Review of Systems  Constitutional: Negative.  Negative for chills, weight loss, diaphoresis, appetite change and fatigue.  HENT: Negative.   Eyes: Negative.  Negative for blurred vision.  Respiratory: Negative.  Negative for cough, choking, chest tightness, shortness of breath and stridor.   Cardiovascular: Negative.  Negative for chest pain, palpitations and leg swelling.  Gastrointestinal: Negative.  Negative for nausea, vomiting, abdominal pain, diarrhea, constipation and blood in stool.  Endocrine: Negative.  Negative for polydipsia, polyphagia and polyuria.  Genitourinary: Negative.   Musculoskeletal: Negative.  Negative for myalgias, back pain, joint swelling and arthralgias.  Skin: Negative.  Negative for rash.  Allergic/Immunologic: Negative.   Neurological: Negative.  Negative for weakness.  Hematological: Negative.  Negative for adenopathy. Does not bruise/bleed easily.       Objective:   Physical Exam  Constitutional: He is oriented to person, place, and time. He appears  well-developed and well-nourished. No distress.  HENT:  Head: Normocephalic and atraumatic.  Mouth/Throat: Oropharynx is clear and moist. No oropharyngeal exudate.  Eyes: Conjunctivae are normal. Right eye exhibits no discharge. Left eye exhibits no discharge. No scleral icterus.  Neck: Normal range of motion. Neck supple. No JVD present. No tracheal deviation present. No thyromegaly present.  Cardiovascular: Normal rate, regular rhythm, normal heart sounds and intact distal pulses.  Exam reveals no gallop and no friction rub.   No murmur heard. Pulmonary/Chest: Effort normal and breath sounds normal. No stridor. No respiratory distress. He has no wheezes. He has no rales. He exhibits no tenderness.  Abdominal: Soft. Bowel sounds are normal. He exhibits no distension and no mass. There is no tenderness. There is no rebound and no guarding.  Musculoskeletal: Normal range of motion. He exhibits no edema or tenderness.  Lymphadenopathy:    He has no cervical adenopathy.  Neurological: He is oriented to person, place, and time.  Skin: Skin is warm and dry. No rash noted. He is not diaphoretic. No erythema. No pallor.  Vitals reviewed.     Lab Results  Component Value Date   WBC 5.9 08/12/2014   HGB 16.2 08/12/2014   HCT 48.2 08/12/2014   PLT 210.0 08/12/2014   GLUCOSE 104* 12/30/2014   CHOL 131 05/28/2013   TRIG 113.0 05/28/2013   HDL 44.50 05/28/2013   LDLCALC 64 05/28/2013   ALT 28 08/12/2014   AST 22 08/12/2014   NA 140 12/30/2014   K 4.0 12/30/2014   CL 103 12/30/2014   CREATININE 1.2 12/30/2014   BUN 15 12/30/2014   CO2 29 12/30/2014   TSH 1.10 08/12/2014   PSA 0.87 08/12/2014  HGBA1C 7.1* 12/30/2014      Assessment & Plan:

## 2015-01-01 NOTE — Assessment & Plan Note (Signed)
His A1C has improved slightly Ne meds are needed at this time He will cont to work on his lifestyle modifications

## 2015-01-01 NOTE — Assessment & Plan Note (Signed)
His BP is well controlled Lytes and renal function are stable 

## 2015-06-27 ENCOUNTER — Telehealth: Payer: Self-pay | Admitting: Internal Medicine

## 2015-06-27 NOTE — Telephone Encounter (Signed)
Patient stated that a coworker has shingles, he was told it was contagious if he'd never have had chicken pox vaccination, he stated he don't think he ever chicken pox vaccination. Please advise

## 2015-06-27 NOTE — Telephone Encounter (Signed)
Tried to call patient back, no answer, if patient calls back---shingles is contagious if you have not had chicken pox or the vaccine---it is more likely that you would develop chicken pox if you were exposed (probably not shingles).

## 2015-09-05 ENCOUNTER — Telehealth: Payer: Self-pay | Admitting: *Deleted

## 2015-09-05 ENCOUNTER — Other Ambulatory Visit: Payer: Self-pay | Admitting: Internal Medicine

## 2015-09-05 MED ORDER — LOSARTAN POTASSIUM-HCTZ 50-12.5 MG PO TABS: 1.0000 | ORAL_TABLET | Freq: Every day | ORAL | Status: DC

## 2015-09-05 NOTE — Telephone Encounter (Signed)
Left msg on triage requesting refills on pt BP med (Losartan). Sent electronically to [facility].../lmb

## 2015-09-19 ENCOUNTER — Other Ambulatory Visit: Payer: Self-pay | Admitting: Internal Medicine

## 2015-11-13 ENCOUNTER — Telehealth: Payer: Self-pay | Admitting: Internal Medicine

## 2015-11-13 NOTE — Telephone Encounter (Signed)
yes

## 2015-11-13 NOTE — Telephone Encounter (Signed)
Got patient scheduled

## 2015-11-13 NOTE — Telephone Encounter (Signed)
Wife is requesting patient to transfer from [facility] to [place].  Please advise.

## 2015-11-13 NOTE — Telephone Encounter (Signed)
ok 

## 2015-12-01 ENCOUNTER — Encounter: Payer: BLUE CROSS/BLUE SHIELD | Admitting: Internal Medicine

## 2015-12-08 ENCOUNTER — Encounter: Payer: Self-pay | Admitting: Internal Medicine

## 2015-12-08 ENCOUNTER — Ambulatory Visit (INDEPENDENT_AMBULATORY_CARE_PROVIDER_SITE_OTHER): Payer: BLUE CROSS/BLUE SHIELD | Admitting: Internal Medicine

## 2015-12-08 VITALS — BP 144/90 | HR 63 | Temp 97.8°F | Resp 16 | Wt 219.0 lb

## 2015-12-08 DIAGNOSIS — E785 Hyperlipidemia, unspecified: Secondary | ICD-10-CM

## 2015-12-08 DIAGNOSIS — R002 Palpitations: Secondary | ICD-10-CM

## 2015-12-08 DIAGNOSIS — E119 Type 2 diabetes mellitus without complications: Secondary | ICD-10-CM | POA: Diagnosis not present

## 2015-12-08 DIAGNOSIS — Z0000 Encounter for general adult medical examination without abnormal findings: Secondary | ICD-10-CM

## 2015-12-08 DIAGNOSIS — I10 Essential (primary) hypertension: Secondary | ICD-10-CM | POA: Diagnosis not present

## 2015-12-08 NOTE — Progress Notes (Signed)
Pre visit review using our clinic review tool, if applicable. No additional management support is needed unless otherwise documented below in the visit note. 

## 2015-12-08 NOTE — Patient Instructions (Addendum)
We have reviewed your prior records including labs and tests today.  Test(s) ordered today. Your results will be released to [facility] (or called to you) after review, usually within 72hours after test completion. If any changes need to be made, you will be notified at that same time.  All other Health Maintenance issues reviewed.   All recommended immunizations and age-appropriate screenings are up-to-date.  No immunizations administered today.   Medications reviewed and updated.  No changes recommended at this time.   Please schedule followup in 6 months  Health Maintenance, Male A healthy lifestyle and preventative care can promote health and wellness.  Maintain regular health, dental, and eye exams.  Eat a healthy diet. Foods like vegetables, fruits, whole grains, low-fat dairy products, and lean protein foods contain the nutrients you need and are low in calories. Decrease your intake of foods high in solid fats, added sugars, and salt. Get information about a proper diet from your health care provider, if necessary.  Regular physical exercise is one of the most important things you can do for your health. Most adults should get at least 150 minutes of moderate-intensity exercise (any activity that increases your heart rate and causes you to sweat) each week. In addition, most adults need muscle-strengthening exercises on 2 or more days a week.   Maintain a healthy weight. The body mass index (BMI) is a screening tool to identify possible weight problems. It provides an estimate of body fat based on height and weight. Your health care provider can find your BMI and can help you achieve or maintain a healthy weight. For males 20 years and older:  A BMI below 18.5 is considered underweight.  A BMI of 18.5 to 24.9 is normal.  A BMI of 25 to 29.9 is considered overweight.  A BMI of 30 and above is considered obese.  Maintain normal blood lipids and cholesterol by exercising and  minimizing your intake of saturated fat. Eat a balanced diet with plenty of fruits and vegetables. Blood tests for lipids and cholesterol should begin at age 6 and be repeated every 5 years. If your lipid or cholesterol levels are high, you are over age 60, or you are at high risk for heart disease, you may need your cholesterol levels checked more frequently.Ongoing high lipid and cholesterol levels should be treated with medicines if diet and exercise are not working.  If you smoke, find out from your health care provider how to quit. If you do not use tobacco, do not start.  Lung cancer screening is recommended for adults aged 76-80 years who are at high risk for developing lung cancer because of a history of smoking. A yearly low-dose CT scan of the lungs is recommended for people who have at least a 30-pack-year history of smoking and are current smokers or have quit within the past 15 years. A pack year of smoking is smoking an average of 1 pack of cigarettes a day for 1 year (for example, a 30-pack-year history of smoking could mean smoking 1 pack a day for 30 years or 2 packs a day for 15 years). Yearly screening should continue until the smoker has stopped smoking for at least 15 years. Yearly screening should be stopped for people who develop a health problem that would prevent them from having lung cancer treatment.  If you choose to drink alcohol, do not have more than 2 drinks per day. One drink is considered to be 12 oz (360 mL) of beer,  5 oz (150 mL) of wine, or 1.5 oz (45 mL) of liquor.  Avoid the use of street drugs. Do not share needles with anyone. Ask for help if you need support or instructions about stopping the use of drugs.  High blood pressure causes heart disease and increases the risk of stroke. High blood pressure is more likely to develop in:  People who have blood pressure in the end of the normal range (100-139/85-89 mm Hg).  People who are overweight or  obese.  People who are African American.  If you are 42-74 years of age, have your blood pressure checked every 3-5 years. If you are 59 years of age or older, have your blood pressure checked every year. You should have your blood pressure measured twice--once when you are at a hospital or clinic, and once when you are not at a hospital or clinic. Record the average of the two measurements. To check your blood pressure when you are not at a hospital or clinic, you can use:  An automated blood pressure machine at a pharmacy.  A home blood pressure monitor.  If you are 4-51 years old, ask your health care provider if you should take aspirin to prevent heart disease.  Diabetes screening involves taking a blood sample to check your fasting blood sugar level. This should be done once every 3 years after age 109 if you are at a normal weight and without risk factors for diabetes. Testing should be considered at a younger age or be carried out more frequently if you are overweight and have at least 1 risk factor for diabetes.  Colorectal cancer can be detected and often prevented. Most routine colorectal cancer screening begins at the age of 72 and continues through age 87. However, your health care provider may recommend screening at an earlier age if you have risk factors for colon cancer. On a yearly basis, your health care provider may provide home test kits to check for hidden blood in the stool. A small camera at the end of a tube may be used to directly examine the colon (sigmoidoscopy or colonoscopy) to detect the earliest forms of colorectal cancer. Talk to your health care provider about this at age 44 when routine screening begins. A direct exam of the colon should be repeated every 5-10 years through age 15, unless early forms of precancerous polyps or small growths are found.  People who are at an increased risk for hepatitis B should be screened for this virus. You are considered at high risk  for hepatitis B if:  You were born in a country where hepatitis B occurs often. Talk with your health care provider about which countries are considered high risk.  Your parents were born in a high-risk country and you have not received a shot to protect against hepatitis B (hepatitis B vaccine).  You have HIV or AIDS.  You use needles to inject street drugs.  You live with, or have sex with, someone who has hepatitis B.  You are a man who has sex with other men (MSM).  You get hemodialysis treatment.  You take certain medicines for conditions like cancer, organ transplantation, and autoimmune conditions.  Hepatitis C blood testing is recommended for all people born from [birth year] through 1964 and any individual with known risk factors for hepatitis C.  Healthy men should no longer receive prostate-specific antigen (PSA) blood tests as part of routine cancer screening. Talk to your health care provider about prostate cancer  screening.  Testicular cancer screening is not recommended for adolescents or adult males who have no symptoms. Screening includes self-exam, a health care provider exam, and other screening tests. Consult with your health care provider about any symptoms you have or any concerns you have about testicular cancer.  Practice safe sex. Use condoms and avoid high-risk sexual practices to reduce the spread of sexually transmitted infections (STIs).  You should be screened for STIs, including gonorrhea and chlamydia if:  You are sexually active and are younger than 24 years.  You are older than 24 years, and your health care provider tells you that you are at risk for this type of infection.  Your sexual activity has changed since you were last screened, and you are at an increased risk for chlamydia or gonorrhea. Ask your health care provider if you are at risk.  If you are at risk of being infected with HIV, it is recommended that you take a prescription medicine daily to  prevent HIV infection. This is called pre-exposure prophylaxis (PrEP). You are considered at risk if:  You are a man who has sex with other men (MSM).  You are a heterosexual man who is sexually active with multiple partners.  You take drugs by injection.  You are sexually active with a partner who has HIV.  Talk with your health care provider about whether you are at high risk of being infected with HIV. If you choose to begin PrEP, you should first be tested for HIV. You should then be tested every 3 months for as long as you are taking PrEP.  Use sunscreen. Apply sunscreen liberally and repeatedly throughout the day. You should seek shade when your shadow is shorter than you. Protect yourself by wearing long sleeves, pants, a wide-brimmed hat, and sunglasses year round whenever you are outdoors.  Tell your health care provider of new moles or changes in moles, especially if there is a change in shape or color. Also, tell your health care provider if a mole is larger than the size of a pencil eraser.  A one-time screening for abdominal aortic aneurysm (AAA) and surgical repair of large AAAs by ultrasound is recommended for men aged 74-75 years who are current or former smokers.  Stay current with your vaccines (immunizations).   This information is not intended to replace advice given to you by your health care provider. Make sure you discuss any questions you have with your health care provider.   Document Released: 06/06/2008 Document Revised: 12/30/2014 Document Reviewed: 05/06/2011 Elsevier Interactive Patient Education [employer].

## 2015-12-08 NOTE — Progress Notes (Signed)
Subjective:    Patient ID: Billy Fernandez, male    DOB: February 23, 1951, 64 y.o.   MRN: [id number]  HPI He is here for a physical exam.  Palpitations:  Occurs at least once a month.  He has had them for years, but they were gone for years and just recently returned.  They typically go away with cough, but can last hours.  No dizzy, sob or chest pain with the palpitations. They only occur when he is at rest.  He can occur with chocolate.  He denies it with coffee.  There is generally no pattern to them.  He had holter years ago and it was normal.    Hyperlipidemia: He is taking his medication every other day. He is not always compliant with a low fat/cholesterol diet. He is not exercising regularly. He denies myalgias.     Diabetes: He is controlling his diabetes with lifestyle. He is not always compliant with a diabetic diet. He is not exercising regularly. He monitors his sugars and they have been running in the 120 - he does not check daily. He checks her feet daily and denies foot lesions. He is not up-to-date with an ophthalmology examination.   Hypertension: He is taking his medication daily. He is not compliant with a low sodium diet.  He denies chest pain, palpitations, edema, shortness of breath and regular headaches. He is not exercising regularly.  He does not monitor his blood pressure at home, 130/80-90.    Left shoulder pain:  Has had pain for a wile.  Has tried tens unit, massage.  Also has neck pain and pain with left looking.  He has chronic neck arthritis.   He has an area of dry skin on left arm - derm has looked at it in the past.     Medications and allergies reviewed with patient and updated if appropriate.  Patient Active Problem List   Diagnosis Date Noted  . Diabetes mellitus type 2 in nonobese ([facility]) 07/01/2014  . Eczema 06/29/2013  . Hyperlipidemia LDL goal < 130 06/29/2013  . Routine general medical examination at a health care facility 05/28/2013  . Low libido  05/28/2013  . Essential hypertension, benign 05/28/2013    Current Outpatient Prescriptions on File Prior to Visit  Medication Sig Dispense Refill  . aspirin 81 MG tablet Take 81 mg by mouth daily.    Marland Kitchen atorvastatin (LIPITOR) 20 MG tablet Take 1 tablet (20 mg total) by mouth daily. 90 tablet 3  . carvedilol (COREG) 6.25 MG tablet TAKE 1 TABLET BY MOUTH 2 TIMES DAILY. 180 tablet 0  . fish oil-omega-3 fatty acids 1000 MG capsule Take 2 g by mouth daily.    Marland Kitchen losartan-hydrochlorothiazide (HYZAAR) 50-12.5 MG per tablet Take 1 tablet by mouth daily. 90 tablet 1  . MAGNESIUM CITRATE PO Take by mouth. 400mg  1 tablet qd    . zinc gluconate 50 MG tablet Take 50 mg by mouth daily.     No current facility-administered medications on file prior to visit.    Past Medical History  Diagnosis Date  . Hypertension     History reviewed. No pertinent past surgical history.  Social History   Social History  . Marital Status: Married    Spouse Name: N/A  . Number of Children: N/A  . Years of Education: N/A   Social History Main Topics  . Smoking status: Former Research scientist (life sciences)  . Smokeless tobacco: Never Used  . Alcohol Use: 0.6 oz/week  1 Cans of beer per week  . Drug Use: No  . Sexual Activity: Yes   Other Topics Concern  . None   Social History Narrative    Review of Systems  Constitutional: Negative for fever and chills.  HENT: Negative for hearing loss.   Eyes: Negative for visual disturbance.  Respiratory: Negative for cough, shortness of breath and wheezing.   Cardiovascular: Positive for palpitations. Negative for chest pain and leg swelling.  Gastrointestinal: Negative for nausea, abdominal pain, diarrhea, constipation and blood in stool.       No GERD  Genitourinary: Negative for dysuria, hematuria and difficulty urinating.  Musculoskeletal: Positive for back pain (intermittent lower back pain), arthralgias (left shoulder ) and neck pain. Negative for myalgias.  Skin: Negative  for rash.       Dry patch on left arm - sees dermatology  Neurological: Positive for headaches (occasional). Negative for dizziness, weakness, light-headedness and numbness.  Psychiatric/Behavioral: Negative for dysphoric mood. The patient is not nervous/anxious.        Objective:   Filed Vitals:   12/08/15 1326  BP: 144/90  Pulse: 63  Temp: 97.8 F (36.6 C)  Resp: 16   Filed Weights   12/08/15 1326  Weight: 219 lb (99.338 kg)   Body mass index is 31.42 kg/(m^2).   Physical Exam  Constitutional: He appears well-developed and well-nourished. No distress.  HENT:  Head: Normocephalic and atraumatic.  Right Ear: External ear normal.  Left Ear: External ear normal.  Mouth/Throat: Oropharynx is clear and moist.  Eyes: Conjunctivae and EOM are normal.  Neck: Neck supple. No tracheal deviation present. No thyromegaly present.  Decreased ROM to left, no carotid bruits  Cardiovascular: Normal rate, regular rhythm and normal heart sounds.   No murmur heard. Pulmonary/Chest: Effort normal and breath sounds normal. No respiratory distress. He has no wheezes. He has no rales.  Abdominal: Soft. He exhibits no distension. There is no tenderness.  Musculoskeletal: He exhibits no edema.  Lymphadenopathy:    He has no cervical adenopathy.  Skin: Skin is warm and dry. No rash noted. He is not diaphoretic.  Psychiatric: He has a normal mood and affect. His behavior is normal.          Assessment & Plan:   Physical exam: Colonoscopy up to date Screening blood work ordered Declined all immunizations Stressed the importance of regular exercise Stressed more healthy diet Work on [employer] loss Eye exam - will schedule EKG today is normal Will contact his cardiologist and make an appt for further evaluation of the palpitations  See Problem List for A/P of chronic medical problems.

## 2015-12-09 NOTE — Assessment & Plan Note (Signed)
Lab Results  Component Value Date   HGBA1C 7.1* 12/30/2014   Fairly controlled in past without medication, will check a1c Will make an eye appt Stressed healthier diet and regular exercise Work on weight loss

## 2015-12-09 NOTE — Assessment & Plan Note (Signed)
BP Readings from Last 3 Encounters:  12/08/15 144/90  12/30/14 120/84  07/01/14 130/84   High here today Advised him to start monitoring his  bp regularly Start regular exercise, low sodium diet and work on weight loss Stressed importance of getting BP better controlled If not controlled at home he will let me know - will titrate meds

## 2015-12-09 NOTE — Assessment & Plan Note (Signed)
Taking statin every other day Check lipid panel Stressed regular exercise, healthier diet and weight loss

## 2015-12-12 ENCOUNTER — Other Ambulatory Visit: Payer: Self-pay | Admitting: Internal Medicine

## 2015-12-13 ENCOUNTER — Other Ambulatory Visit (INDEPENDENT_AMBULATORY_CARE_PROVIDER_SITE_OTHER): Payer: BLUE CROSS/BLUE SHIELD

## 2015-12-13 DIAGNOSIS — Z0000 Encounter for general adult medical examination without abnormal findings: Secondary | ICD-10-CM

## 2015-12-13 DIAGNOSIS — E119 Type 2 diabetes mellitus without complications: Secondary | ICD-10-CM

## 2015-12-13 LAB — CBC WITH DIFFERENTIAL/PLATELET
BASOS ABS: 0 10*3/uL (ref 0.0–0.1)
Basophils Relative: 0.7 % (ref 0.0–3.0)
EOS PCT: 1.8 % (ref 0.0–5.0)
Eosinophils Absolute: 0.1 10*3/uL (ref 0.0–0.7)
HCT: 48.3 % (ref 39.0–52.0)
HEMOGLOBIN: 16.5 g/dL (ref 13.0–17.0)
LYMPHS ABS: 1.9 10*3/uL (ref 0.7–4.0)
LYMPHS PCT: 31.9 % (ref 12.0–46.0)
MCHC: 34.1 g/dL (ref 30.0–36.0)
MCV: 87 fl (ref 78.0–100.0)
MONOS PCT: 9.9 % (ref 3.0–12.0)
Monocytes Absolute: 0.6 10*3/uL (ref 0.1–1.0)
NEUTROS PCT: 55.7 % (ref 43.0–77.0)
Neutro Abs: 3.4 10*3/uL (ref 1.4–7.7)
Platelets: 212 10*3/uL (ref 150.0–400.0)
RBC: 5.55 Mil/uL (ref 4.22–5.81)
RDW: 12.8 % (ref 11.5–15.5)
WBC: 6.1 10*3/uL (ref 4.0–10.5)

## 2015-12-13 LAB — COMPREHENSIVE METABOLIC PANEL
ALBUMIN: 4.1 g/dL (ref 3.5–5.2)
ALK PHOS: 39 U/L (ref 39–117)
ALT: 23 U/L (ref 0–53)
AST: 18 U/L (ref 0–37)
BILIRUBIN TOTAL: 0.9 mg/dL (ref 0.2–1.2)
BUN: 20 mg/dL (ref 6–23)
CO2: 30 mEq/L (ref 19–32)
Calcium: 9.9 mg/dL (ref 8.4–10.5)
Chloride: 102 mEq/L (ref 96–112)
Creatinine, Ser: 1.06 mg/dL (ref 0.40–1.50)
GFR: 74.78 mL/min (ref >60.00)
GLUCOSE: 185 mg/dL — AB (ref 70–99)
Potassium: 4.3 mEq/L (ref 3.5–5.1)
Sodium: 141 mEq/L (ref 135–145)
TOTAL PROTEIN: 7 g/dL (ref 6.0–8.3)

## 2015-12-13 LAB — LIPID PANEL
CHOL/HDL RATIO: 3
Cholesterol: 139 mg/dL (ref 0–200)
HDL: 44.3 mg/dL (ref >39.00)
LDL CALC: 65 mg/dL (ref 0–99)
NONHDL: 94.43
Triglycerides: 149 mg/dL (ref 0.0–149.0)
VLDL: 29.8 mg/dL (ref 0.0–40.0)

## 2015-12-13 LAB — HEMOGLOBIN A1C: HEMOGLOBIN A1C: 8.3 % — AB (ref 4.6–6.5)

## 2015-12-13 LAB — MICROALBUMIN / CREATININE URINE RATIO
Creatinine,U: 161.8 mg/dL
MICROALB/CREAT RATIO: 0.4 mg/g (ref 0.0–30.0)
Microalb, Ur: 0.7 mg/dL (ref 0.0–1.9)

## 2015-12-13 LAB — TSH: TSH: 1.66 u[IU]/mL (ref 0.35–4.50)

## 2015-12-14 LAB — TESTOSTERONE, FREE, TOTAL, SHBG
SEX HORMONE BINDING: 49 nmol/L (ref 22–77)
TESTOSTERONE FREE: 71.4 pg/mL (ref 47.0–244.0)
TESTOSTERONE-% FREE: 1.6 % (ref 1.6–2.9)
TESTOSTERONE: 448 ng/dL (ref 300–890)

## 2015-12-14 LAB — PSA, TOTAL AND FREE
PSA, Free Pct: 28 % (ref >25)
PSA, Free: 0.29 ng/mL
PSA: 1.04 ng/mL (ref <=4.00)

## 2015-12-14 LAB — HIV ANTIBODY (ROUTINE TESTING W REFLEX): HIV: NONREACTIVE

## 2015-12-15 ENCOUNTER — Other Ambulatory Visit: Payer: Self-pay | Admitting: *Deleted

## 2015-12-15 MED ORDER — ATORVASTATIN CALCIUM 20 MG PO TABS: 20.0000 mg | ORAL_TABLET | Freq: Every day | ORAL | Status: DC

## 2015-12-15 MED ORDER — CARVEDILOL 6.25 MG PO TABS: ORAL_TABLET | ORAL | Status: DC

## 2015-12-21 ENCOUNTER — Encounter: Payer: Self-pay | Admitting: Internal Medicine

## 2015-12-21 MED ORDER — VALSARTAN-HYDROCHLOROTHIAZIDE 320-12.5 MG PO TABS: 1.0000 | ORAL_TABLET | Freq: Every day | ORAL | Status: DC

## 2015-12-21 MED ORDER — METFORMIN HCL 500 MG PO TABS: 500.0000 mg | ORAL_TABLET | Freq: Two times a day (BID) | ORAL | Status: DC

## 2016-02-16 ENCOUNTER — Ambulatory Visit (INDEPENDENT_AMBULATORY_CARE_PROVIDER_SITE_OTHER): Payer: BLUE CROSS/BLUE SHIELD | Admitting: Podiatry

## 2016-02-16 ENCOUNTER — Ambulatory Visit (INDEPENDENT_AMBULATORY_CARE_PROVIDER_SITE_OTHER): Payer: BLUE CROSS/BLUE SHIELD

## 2016-02-16 ENCOUNTER — Encounter: Payer: Self-pay | Admitting: Podiatry

## 2016-02-16 ENCOUNTER — Ambulatory Visit: Payer: BLUE CROSS/BLUE SHIELD | Admitting: Podiatry

## 2016-02-16 VITALS — BP 140/83 | HR 62 | Resp 16 | Ht 70.0 in | Wt 204.0 lb

## 2016-02-16 DIAGNOSIS — L600 Ingrowing nail: Secondary | ICD-10-CM

## 2016-02-16 DIAGNOSIS — M79675 Pain in left toe(s): Secondary | ICD-10-CM

## 2016-02-16 NOTE — Progress Notes (Signed)
   Subjective:    Patient ID: Billy Fernandez, male    DOB: [birth year], 65 y.o.   MRN: [id number]  HPI Patient presents with a nail problem on their Left foot; great toe-lateral side; red/swollen. Pt stated, "Hurts to touch tip of nail; kicked a stool last week".  Pt diabetic types 2; sugar=did not take today; A1C=8.3   Review of Systems  All other systems reviewed and are negative.      Objective:   Physical Exam        Assessment & Plan:

## 2016-02-16 NOTE — Patient Instructions (Signed)

## 2016-02-19 NOTE — Progress Notes (Signed)
Subjective:     Patient ID: Billy Fernandez, male   DOB: 21-Jul-1951, 65 y.o.   MRN: [id number]  HPI patient states she's developed an ingrown toenail on his left big toe and it has gradually become more irritated with redness on the edge of it and drainage which is no longer present and his diabetes overall has been doing good and he is been controlling it well   Review of Systems  All other systems reviewed and are negative.      Objective:   Physical Exam  Constitutional: He is oriented to person, place, and time.  Cardiovascular: Intact distal pulses.   Musculoskeletal: Normal range of motion.  Neurological: He is oriented to person, place, and time.  Skin: Skin is warm.  Nursing note and vitals reviewed.  neurovascular status intact muscle strength adequate range of motion was found to be normal with incurvated left hallux lateral border that's painful when pressed and making shoe gear difficult. Patient's tried trimming and soaking without relief and I did not note any proximal erythema edema or drainage and patient has good digital perfusion and is well oriented 3     Assessment:     Ingrown toenail deformity left hallux lateral border that's painful when pressed    Plan:     H&P and condition reviewed with patient. I've recommended removal of the corner and I explained procedure and risk and patient wants procedure. Today I infiltrated the left hallux 60 mg Xylocaine Marcaine mixture remove the border exposed matrix and applied phenol 3 applications 30 seconds followed by alcohol lavage and sterile dressing. Gave instructions on soaks and reappoint

## 2016-02-21 ENCOUNTER — Ambulatory Visit: Payer: BLUE CROSS/BLUE SHIELD | Admitting: Podiatry

## 2016-02-22 ENCOUNTER — Telehealth: Payer: Self-pay | Admitting: *Deleted

## 2016-02-22 NOTE — Telephone Encounter (Signed)
Called patient at [phone] (Home #) to check to see how they were doing from their ingrown toenail procedure that was performed on Friday, February 16, 2016. Pt's wife stated, "The first night was rough, her husband's toe was throbbing. Her husband is doing much better now, but does have some drainage still. Her husband took Aleve Friday night to help with the pain". Pt's wife also stated, "They appreciated Dr. Mellody Drown compassion and will highly recommend him to anyone".

## 2016-03-18 ENCOUNTER — Other Ambulatory Visit (INDEPENDENT_AMBULATORY_CARE_PROVIDER_SITE_OTHER): Payer: BLUE CROSS/BLUE SHIELD

## 2016-03-18 DIAGNOSIS — E119 Type 2 diabetes mellitus without complications: Secondary | ICD-10-CM | POA: Diagnosis not present

## 2016-03-18 LAB — HEMOGLOBIN A1C: Hgb A1c MFr Bld: 6.7 % — ABNORMAL HIGH (ref 4.6–6.5)

## 2016-03-22 ENCOUNTER — Encounter: Payer: Self-pay | Admitting: Internal Medicine

## 2016-03-22 ENCOUNTER — Ambulatory Visit (INDEPENDENT_AMBULATORY_CARE_PROVIDER_SITE_OTHER): Payer: BLUE CROSS/BLUE SHIELD | Admitting: Internal Medicine

## 2016-03-22 VITALS — BP 122/80 | HR 56 | Temp 98.6°F | Resp 16 | Wt 201.0 lb

## 2016-03-22 DIAGNOSIS — E119 Type 2 diabetes mellitus without complications: Secondary | ICD-10-CM | POA: Diagnosis not present

## 2016-03-22 DIAGNOSIS — E785 Hyperlipidemia, unspecified: Secondary | ICD-10-CM | POA: Diagnosis not present

## 2016-03-22 DIAGNOSIS — I10 Essential (primary) hypertension: Secondary | ICD-10-CM

## 2016-03-22 NOTE — Assessment & Plan Note (Signed)
Lab Results  Component Value Date   HGBA1C 6.7* 03/18/2016   Better controlled Continue metformin BID Regular exercise Low sugar/carb diet Weight is stable F/u in 6 months

## 2016-03-22 NOTE — Progress Notes (Signed)
Pre visit review using our clinic review tool, if applicable. No additional management support is needed unless otherwise documented below in the visit note. 

## 2016-03-22 NOTE — Assessment & Plan Note (Signed)
Lipid well controlled Exercise, eating healthy Taking lipitor daily

## 2016-03-22 NOTE — Patient Instructions (Signed)

## 2016-03-22 NOTE — Assessment & Plan Note (Signed)
bp borderline high at home No change in meds Continue current medication Low sodium diet - he struggles with this Regular exercise

## 2016-03-22 NOTE — Progress Notes (Signed)
Subjective:    Patient ID: Billy Fernandez, male    DOB: August 22, 1951, 65 y.o.   MRN: [id number]  HPI He is here for follow up.   Hypertension: He is taking his medication daily. He is compliant with a low sodium diet.  He denies chest pain, palpitations, edema, shortness of breath and regular headaches. He is exercising regularly.  He does not monitor his blood pressure at home.    Diabetes: He is taking his medication daily as prescribed. He is compliant with a diabetic diet. He is exercising regularly. He monitors his sugars and they have been running 81,104. He checks his feet daily and denies foot lesions. He is up-to-date with an ophthalmology examination.   Hyperlipidemia: He is taking his medication daily. He is compliant with a low fat/cholesterol diet. He is exercising regularly. He denies myalgias.     Medications and allergies reviewed with patient and updated if appropriate.  Patient Active Problem List   Diagnosis Date Noted  . Diabetes mellitus type 2 in nonobese ([facility]) 07/01/2014  . Eczema 06/29/2013  . Hyperlipidemia with target LDL less than 130 06/29/2013  . Low libido 05/28/2013  . Essential hypertension, benign 05/28/2013    Current Outpatient Prescriptions on File Prior to Visit  Medication Sig Dispense Refill  . aspirin 81 MG tablet Take 81 mg by mouth daily.    Marland Kitchen atorvastatin (LIPITOR) 20 MG tablet Take 1 tablet (20 mg total) by mouth daily. 90 tablet 1  . carvedilol (COREG) 6.25 MG tablet TAKE 1 TABLET BY MOUTH 2 TIMES DAILY. 180 tablet 1  . fish oil-omega-3 fatty acids 1000 MG capsule Take 2 g by mouth daily.    Marland Kitchen MAGNESIUM CITRATE PO Take by mouth. 400mg  1 tablet qd    . metFORMIN (GLUCOPHAGE) 500 MG tablet Take 1 tablet (500 mg total) by mouth 2 (two) times daily with a meal.    . valsartan-hydrochlorothiazide (DIOVAN-HCT) 320-12.5 MG tablet Take 1 tablet by mouth daily.    Marland Kitchen zinc gluconate 50 MG tablet Take 50 mg by mouth daily.     No current  facility-administered medications on file prior to visit.    Past Medical History  Diagnosis Date  . Hypertension     No past surgical history on file.  Social History   Social History  . Marital Status: Married    Spouse Name: N/A  . Number of Children: N/A  . Years of Education: N/A   Social History Main Topics  . Smoking status: Former Research scientist (life sciences)  . Smokeless tobacco: Never Used  . Alcohol Use: 0.6 oz/week    1 Cans of beer per week  . Drug Use: No  . Sexual Activity: Yes   Other Topics Concern  . None   Social History Narrative    Family History  Problem Relation Age of Onset  . Breast cancer Mother   . Cancer Mother   . Diabetes Mother   . Heart disease Father   . Hyperlipidemia Father   . Hypertension Father   . Diabetes Brother   . Alcohol abuse Neg Hx   . COPD Neg Hx   . Depression Neg Hx   . Drug abuse Neg Hx   . Early death Neg Hx   . Hearing loss Neg Hx   . Kidney disease Neg Hx   . Stroke Neg Hx     Review of Systems  Constitutional: Negative for fever and chills.  Respiratory: Negative for cough, shortness of breath  and wheezing.   Cardiovascular: Negative for chest pain, palpitations and leg swelling.  Neurological: Negative for dizziness, light-headedness and headaches.       Objective:   Filed Vitals:   03/22/16 1403  BP: 122/80  Pulse: 56  Temp: 98.6 F (37 C)  Resp: 16   Filed Weights   03/22/16 1403  Weight: 201 lb (91.173 kg)   Body mass index is 28.84 kg/(m^2).   Physical Exam Constitutional: Appears well-developed and well-nourished. No distress.  Neck: Neck supple. No tracheal deviation present. No thyromegaly present.  No carotid bruit. No cervical adenopathy.   Cardiovascular: Normal rate, regular rhythm and normal heart sounds.   No murmur heard.  No edema Pulmonary/Chest: Effort normal and breath sounds normal. No respiratory distress. No wheezes.        Assessment & Plan:    See Problem List for  Assessment and Plan of chronic medical problems.  Follow up in 6 months

## 2016-04-02 ENCOUNTER — Other Ambulatory Visit: Payer: Self-pay | Admitting: *Deleted

## 2016-04-02 MED ORDER — VALSARTAN-HYDROCHLOROTHIAZIDE 320-12.5 MG PO TABS
1.0000 | ORAL_TABLET | Freq: Every day | ORAL | Status: DC
Start: 2016-04-02 — End: 2016-12-13

## 2016-04-02 MED ORDER — CARVEDILOL 6.25 MG PO TABS: ORAL_TABLET | ORAL | Status: DC

## 2016-04-02 MED ORDER — METFORMIN HCL 500 MG PO TABS: 500.0000 mg | ORAL_TABLET | Freq: Two times a day (BID) | ORAL | Status: DC

## 2016-04-02 MED ORDER — ATORVASTATIN CALCIUM 20 MG PO TABS: 20.0000 mg | ORAL_TABLET | Freq: Every day | ORAL | Status: DC

## 2016-04-02 NOTE — Telephone Encounter (Signed)
Received call from pt wife she stated husband saw MD last week ask that medications be refilled & sent to [facility]. Refills was not done needing all meds sent to [facility]. Inform wife sending electronically...Billy Fernandez

## 2016-06-12 ENCOUNTER — Other Ambulatory Visit: Payer: Self-pay | Admitting: Internal Medicine

## 2016-12-13 ENCOUNTER — Ambulatory Visit (INDEPENDENT_AMBULATORY_CARE_PROVIDER_SITE_OTHER): Payer: BLUE CROSS/BLUE SHIELD | Admitting: Internal Medicine

## 2016-12-13 ENCOUNTER — Encounter: Payer: Self-pay | Admitting: Internal Medicine

## 2016-12-13 VITALS — BP 130/80 | HR 70 | Temp 97.8°F | Resp 16 | Ht 70.0 in | Wt 216.0 lb

## 2016-12-13 DIAGNOSIS — Z0000 Encounter for general adult medical examination without abnormal findings: Secondary | ICD-10-CM | POA: Diagnosis not present

## 2016-12-13 DIAGNOSIS — E785 Hyperlipidemia, unspecified: Secondary | ICD-10-CM | POA: Diagnosis not present

## 2016-12-13 DIAGNOSIS — E119 Type 2 diabetes mellitus without complications: Secondary | ICD-10-CM | POA: Diagnosis not present

## 2016-12-13 DIAGNOSIS — I10 Essential (primary) hypertension: Secondary | ICD-10-CM | POA: Diagnosis not present

## 2016-12-13 MED ORDER — ATORVASTATIN CALCIUM 20 MG PO TABS: 20.0000 mg | ORAL_TABLET | Freq: Every day | ORAL | 3 refills | Status: DC

## 2016-12-13 MED ORDER — METFORMIN HCL 500 MG PO TABS: 500.0000 mg | ORAL_TABLET | Freq: Two times a day (BID) | ORAL | 3 refills | Status: DC

## 2016-12-13 MED ORDER — VALSARTAN-HYDROCHLOROTHIAZIDE 320-12.5 MG PO TABS
1.0000 | ORAL_TABLET | Freq: Every day | ORAL | 3 refills | Status: DC
Start: 2016-12-13 — End: 2017-06-23

## 2016-12-13 MED ORDER — CARVEDILOL 6.25 MG PO TABS: 6.2500 mg | ORAL_TABLET | Freq: Two times a day (BID) | ORAL | 3 refills | Status: DC

## 2016-12-13 NOTE — Progress Notes (Signed)
Subjective:    Patient ID: Billy Fernandez, male    DOB: 06-12-1951, 65 y.o.   MRN: [id number]  HPI He is here for a physical exam.   Neck pain:  He has chronic neck pain.  He takes aleve as needed.  He has been doing stretches.  He can have a burning sensation in his upper neck.  He has had radiating pain in his left arm.  Right now he feels his symptoms are controlled - they flare up at times.   Diabetes: He is taking his medication daily as prescribed. He is compliant with a diabetic diet. He is active at work and will start exercising regularly. He monitors his sugars and they have been running highest 150's, usually well controlled. He checks his feet daily and denies foot lesions. He is up-to-date with an ophthalmology examination.    Medications and allergies reviewed with patient and updated if appropriate.  Patient Active Problem List   Diagnosis Date Noted  . 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 Eczema 06/29/2013  . Hyperlipidemia with target LDL less than 130 06/29/2013  . Low libido 05/28/2013  . Essential hypertension, benign 05/28/2013    Current Outpatient Prescriptions on File Prior to Visit  Medication Sig Dispense Refill  . aspirin 81 MG tablet Take 81 mg by mouth daily.    . fish oil-omega-3 fatty acids 1000 MG capsule Take 2 g by mouth daily.    Marland Kitchen MAGNESIUM CITRATE PO Take by mouth. 400mg  1 tablet qd    . zinc gluconate 50 MG tablet Take 50 mg by mouth daily.     No current facility-administered medications on file prior to visit.     Past Medical History:  Diagnosis Date  . Hypertension     History reviewed. No pertinent surgical history.  Social History   Social History  . Marital status: Married    Spouse name: N/A  . Number of children: N/A  . Years of education: N/A   Social History Main Topics  . Smoking status: Former Research scientist (life sciences)  . Smokeless tobacco: Never Used  . Alcohol use 0.6 oz/week    1 Cans of beer per week  .  Drug use: No  . Sexual activity: Yes   Other Topics Concern  . None   Social History Narrative  . None    Family History  Problem Relation Age of Onset  . Breast cancer Mother   . Cancer Mother   . Diabetes Mother   . Heart disease Father   . Hyperlipidemia Father   . Hypertension Father   . Diabetes Brother   . Heart disease Brother     cabg  . Alcohol abuse Neg Hx   . COPD Neg Hx   . Depression Neg Hx   . Drug abuse Neg Hx   . Early death Neg Hx   . Hearing loss Neg Hx   . Kidney disease Neg Hx   . Stroke Neg Hx     Review of Systems  Constitutional: Negative for chills and fever.  Eyes: Negative for visual disturbance.  Respiratory: Negative for cough, shortness of breath and wheezing.   Cardiovascular: Positive for palpitations (few). Negative for chest pain and leg swelling.  Gastrointestinal: Negative for abdominal pain, blood in stool, constipation and diarrhea.       No gerd  Genitourinary: Negative for difficulty urinating, dysuria and hematuria.  Musculoskeletal: Positive for neck pain. Negative for arthralgias and back pain.  Skin: Negative for color change and rash.  Neurological: Negative for dizziness, light-headedness and headaches.  Psychiatric/Behavioral: Negative for dysphoric mood. The patient is not nervous/anxious.        Objective:   Vitals:   12/13/16 1326  BP: 130/80  Pulse: 70  Resp: 16  Temp: 97.8 F (36.6 C)   Filed Weights   12/13/16 1326  Weight: 216 lb (98 kg)   Body mass index is 30.99 kg/m.   Physical Exam Constitutional: He appears well-developed and well-nourished. No distress.  HENT:  Head: Normocephalic and atraumatic.  Right Ear: External ear normal.  Left Ear: External ear normal.  Mouth/Throat: Oropharynx is clear and moist.  Normal ear canals and TM b/l  Eyes: Conjunctivae and EOM are normal.  Neck: Neck supple. No tracheal deviation present. No thyromegaly present.  No carotid bruit  Cardiovascular:  Normal rate, regular rhythm, normal heart sounds and intact distal pulses.   No murmur heard. Pulmonary/Chest: Effort normal and breath sounds normal. No respiratory distress. He has no wheezes. He has no rales.  Abdominal: Soft. Bowel sounds are normal. He exhibits no distension. There is no tenderness.  Genitourinary: prostate smooth without nodules, minimally enlarged  Musculoskeletal: He exhibits no edema.  Lymphadenopathy:   He has no cervical adenopathy.  Skin: Skin is warm and dry. He is not diaphoretic.  Psychiatric: He has a normal mood and affect. His behavior is normal.        Assessment & Plan:   Physical exam: Screening blood work ordered Immunizations deferred  Colonoscopy - will schedule Eye exams  Up to date  EKG - done today and is NSR, normal EKG Exercise - active at work, plans on joining gym Weight - will work on weight loss Skin  - sees derm annually Substance abuse  none  See Problem List for Assessment and Plan of chronic medical problems.   Blood work in 6 months, follow up with me annually

## 2016-12-13 NOTE — Progress Notes (Signed)
Pre visit review using our clinic review tool, if applicable. No additional management support is needed unless otherwise documented below in the visit note. 

## 2016-12-13 NOTE — Patient Instructions (Signed)
Test(s) ordered today. Your results will be released to [place] (or called to you) after review, usually within 72hours after test completion. If any changes need to be made, you will be notified at that same time.  All other Health Maintenance issues reviewed.   All recommended immunizations and age-appropriate screenings are up-to-date or discussed.  No immunizations administered today.   Medications reviewed and updated.  No changes recommended at this time.  Your prescription(s) have been submitted to your pharmacy. Please take as directed and contact our office if you believe you are having problem(s) with the medication(s).   Please followup in one year with me - - blood work in 6 months   Health Maintenance, Male A healthy lifestyle and preventative care can promote health and wellness.  Maintain regular health, dental, and eye exams.  Eat a healthy diet. Foods like vegetables, fruits, whole grains, low-fat dairy products, and lean protein foods contain the nutrients you need and are low in calories. Decrease your intake of foods high in solid fats, added sugars, and salt. Get information about a proper diet from your health care provider, if necessary.  Regular physical exercise is one of the most important things you can do for your health. Most adults should get at least 150 minutes of moderate-intensity exercise (any activity that increases your heart rate and causes you to sweat) each week. In addition, most adults need muscle-strengthening exercises on 2 or more days a week.   Maintain a healthy weight. The body mass index (BMI) is a screening tool to identify possible weight problems. It provides an estimate of body fat based on height and weight. Your health care provider can find your BMI and can help you achieve or maintain a healthy weight. For males 20 years and older:  A BMI below 18.5 is considered underweight.  A BMI of 18.5 to 24.9 is normal.  A BMI of 25 to  29.9 is considered overweight.  A BMI of 30 and above is considered obese.  Maintain normal blood lipids and cholesterol by exercising and minimizing your intake of saturated fat. Eat a balanced diet with plenty of fruits and vegetables. Blood tests for lipids and cholesterol should begin at age 48 and be repeated every 5 years. If your lipid or cholesterol levels are high, you are over age 48, or you are at high risk for heart disease, you may need your cholesterol levels checked more frequently.Ongoing high lipid and cholesterol levels should be treated with medicines if diet and exercise are not working.  If you smoke, find out from your health care provider how to quit. If you do not use tobacco, do not start.  Lung cancer screening is recommended for adults aged 46-80 years who are at high risk for developing lung cancer because of a history of smoking. A yearly low-dose CT scan of the lungs is recommended for people who have at least a 30-pack-year history of smoking and are current smokers or have quit within the past 15 years. A pack year of smoking is smoking an average of 1 pack of cigarettes a day for 1 year (for example, a 30-pack-year history of smoking could mean smoking 1 pack a day for 30 years or 2 packs a day for 15 years). Yearly screening should continue until the smoker has stopped smoking for at least 15 years. Yearly screening should be stopped for people who develop a health problem that would prevent them from having lung cancer treatment.  If  you choose to drink alcohol, do not have more than 2 drinks per day. One drink is considered to be 12 oz (360 mL) of beer, 5 oz (150 mL) of wine, or 1.5 oz (45 mL) of liquor.  Avoid the use of street drugs. Do not share needles with anyone. Ask for help if you need support or instructions about stopping the use of drugs.  High blood pressure causes heart disease and increases the risk of stroke. High blood pressure is more likely to  develop in:  People who have blood pressure in the end of the normal range (100-139/85-89 mm Hg).  People who are overweight or obese.  People who are African American.  If you are 68-89 years of age, have your blood pressure checked every 3-5 years. If you are 51 years of age or older, have your blood pressure checked every year. You should have your blood pressure measured twice-once when you are at a hospital or clinic, and once when you are not at a hospital or clinic. Record the average of the two measurements. To check your blood pressure when you are not at a hospital or clinic, you can use:  An automated blood pressure machine at a pharmacy.  A home blood pressure monitor.  If you are 74-27 years old, ask your health care provider if you should take aspirin to prevent heart disease.  Diabetes screening involves taking a blood sample to check your fasting blood sugar level. This should be done once every 3 years after age 64 if you are at a normal weight and without risk factors for diabetes. Testing should be considered at a younger age or be carried out more frequently if you are overweight and have at least 1 risk factor for diabetes.  Colorectal cancer can be detected and often prevented. Most routine colorectal cancer screening begins at the age of 53 and continues through age 70. However, your health care provider may recommend screening at an earlier age if you have risk factors for colon cancer. On a yearly basis, your health care provider may provide home test kits to check for hidden blood in the stool. A small camera at the end of a tube may be used to directly examine the colon (sigmoidoscopy or colonoscopy) to detect the earliest forms of colorectal cancer. Talk to your health care provider about this at age 25 when routine screening begins. A direct exam of the colon should be repeated every 5-10 years through age 39, unless early forms of precancerous polyps or small growths  are found.  People who are at an increased risk for hepatitis B should be screened for this virus. You are considered at high risk for hepatitis B if:  You were born in a country where hepatitis B occurs often. Talk with your health care provider about which countries are considered high risk.  Your parents were born in a high-risk country and you have not received a shot to protect against hepatitis B (hepatitis B vaccine).  You have HIV or AIDS.  You use needles to inject street drugs.  You live with, or have sex with, someone who has hepatitis B.  You are a man who has sex with other men (MSM).  You get hemodialysis treatment.  You take certain medicines for conditions like cancer, organ transplantation, and autoimmune conditions.  Hepatitis C blood testing is recommended for all people born from [birth year] through 1964 and any individual with known risk factors for hepatitis C.  Healthy men should no longer receive prostate-specific antigen (PSA) blood tests as part of routine cancer screening. Talk to your health care provider about prostate cancer screening.  Testicular cancer screening is not recommended for adolescents or adult males who have no symptoms. Screening includes self-exam, a health care provider exam, and other screening tests. Consult with your health care provider about any symptoms you have or any concerns you have about testicular cancer.  Practice safe sex. Use condoms and avoid high-risk sexual practices to reduce the spread of sexually transmitted infections (STIs).  You should be screened for STIs, including gonorrhea and chlamydia if:  You are sexually active and are younger than 24 years.  You are older than 24 years, and your health care provider tells you that you are at risk for this type of infection.  Your sexual activity has changed since you were last screened, and you are at an increased risk for chlamydia or gonorrhea. Ask your health care provider  if you are at risk.  If you are at risk of being infected with HIV, it is recommended that you take a prescription medicine daily to prevent HIV infection. This is called pre-exposure prophylaxis (PrEP). You are considered at risk if:  You are a man who has sex with other men (MSM).  You are a heterosexual man who is sexually active with multiple partners.  You take drugs by injection.  You are sexually active with a partner who has HIV.  Talk with your health care provider about whether you are at high risk of being infected with HIV. If you choose to begin PrEP, you should first be tested for HIV. You should then be tested every 3 months for as long as you are taking PrEP.  Use sunscreen. Apply sunscreen liberally and repeatedly throughout the day. You should seek shade when your shadow is shorter than you. Protect yourself by wearing long sleeves, pants, a wide-brimmed hat, and sunglasses year round whenever you are outdoors.  Tell your health care provider of new moles or changes in moles, especially if there is a change in shape or color. Also, tell your health care provider if a mole is larger than the size of a pencil eraser.  A one-time screening for abdominal aortic aneurysm (AAA) and surgical repair of large AAAs by ultrasound is recommended for men aged 22-75 years who are current or former smokers.  Stay current with your vaccines (immunizations). This information is not intended to replace advice given to you by your health care provider. Make sure you discuss any questions you have with your health care provider. Document Released: 06/06/2008 Document Revised: 12/30/2014 Document Reviewed: 09/12/2015 Elsevier Interactive Patient Education  2017 [employer].

## 2016-12-14 NOTE — Assessment & Plan Note (Signed)
Check lipid panel  Continue statin Regular exercise and healthy diet encouraged

## 2016-12-14 NOTE — Assessment & Plan Note (Addendum)
BP well controlled Current regimen effective and well tolerated Continue current medications at current doses cmp EKG today

## 2016-12-14 NOTE — Assessment & Plan Note (Signed)
Check a1c, urine micro Work on increasing exercise and weight loss Continue metformin at current dose

## 2016-12-27 ENCOUNTER — Other Ambulatory Visit (INDEPENDENT_AMBULATORY_CARE_PROVIDER_SITE_OTHER): Payer: BLUE CROSS/BLUE SHIELD

## 2016-12-27 ENCOUNTER — Other Ambulatory Visit: Payer: Self-pay | Admitting: Internal Medicine

## 2016-12-27 DIAGNOSIS — Z0000 Encounter for general adult medical examination without abnormal findings: Secondary | ICD-10-CM | POA: Diagnosis not present

## 2016-12-27 DIAGNOSIS — I10 Essential (primary) hypertension: Secondary | ICD-10-CM

## 2016-12-27 DIAGNOSIS — E119 Type 2 diabetes mellitus without complications: Secondary | ICD-10-CM | POA: Diagnosis not present

## 2016-12-27 LAB — COMPREHENSIVE METABOLIC PANEL
ALBUMIN: 4.1 g/dL (ref 3.5–5.2)
ALT: 22 U/L (ref 0–53)
AST: 16 U/L (ref 0–37)
Alkaline Phosphatase: 32 U/L — ABNORMAL LOW (ref 39–117)
BILIRUBIN TOTAL: 0.8 mg/dL (ref 0.2–1.2)
BUN: 25 mg/dL — AB (ref 6–23)
CALCIUM: 9.4 mg/dL (ref 8.4–10.5)
CO2: 29 meq/L (ref 19–32)
CREATININE: 1.13 mg/dL (ref 0.40–1.50)
Chloride: 105 mEq/L (ref 96–112)
GFR: 69.24 mL/min (ref >60.00)
Glucose, Bld: 159 mg/dL — ABNORMAL HIGH (ref 70–99)
Potassium: 4.1 mEq/L (ref 3.5–5.1)
SODIUM: 141 meq/L (ref 135–145)
Total Protein: 7 g/dL (ref 6.0–8.3)

## 2016-12-27 LAB — LIPID PANEL
CHOL/HDL RATIO: 3
Cholesterol: 131 mg/dL (ref 0–200)
HDL: 45.9 mg/dL (ref >39.00)
LDL Cholesterol: 64 mg/dL (ref 0–99)
NonHDL: 85.42
TRIGLYCERIDES: 108 mg/dL (ref 0.0–149.0)
VLDL: 21.6 mg/dL (ref 0.0–40.0)

## 2016-12-27 LAB — CBC WITH DIFFERENTIAL/PLATELET
BASOS ABS: 0 10*3/uL (ref 0.0–0.1)
Basophils Relative: 0.7 % (ref 0.0–3.0)
EOS ABS: 0.2 10*3/uL (ref 0.0–0.7)
Eosinophils Relative: 3.3 % (ref 0.0–5.0)
HEMATOCRIT: 44.8 % (ref 39.0–52.0)
HEMOGLOBIN: 15.6 g/dL (ref 13.0–17.0)
LYMPHS PCT: 31.7 % (ref 12.0–46.0)
Lymphs Abs: 1.8 10*3/uL (ref 0.7–4.0)
MCHC: 34.8 g/dL (ref 30.0–36.0)
MCV: 87.2 fl (ref 78.0–100.0)
Monocytes Absolute: 0.6 10*3/uL (ref 0.1–1.0)
Monocytes Relative: 9.8 % (ref 3.0–12.0)
Neutro Abs: 3.1 10*3/uL (ref 1.4–7.7)
Neutrophils Relative %: 54.5 % (ref 43.0–77.0)
PLATELETS: 230 10*3/uL (ref 150.0–400.0)
RBC: 5.13 Mil/uL (ref 4.22–5.81)
RDW: 13.4 % (ref 11.5–15.5)
WBC: 5.8 10*3/uL (ref 4.0–10.5)

## 2016-12-27 LAB — TSH: TSH: 1.09 u[IU]/mL (ref 0.35–4.50)

## 2016-12-27 LAB — MICROALBUMIN / CREATININE URINE RATIO
Creatinine,U: 170.2 mg/dL
MICROALB UR: 0.7 mg/dL (ref 0.0–1.9)
Microalb Creat Ratio: 0.4 mg/g (ref 0.0–30.0)

## 2016-12-27 LAB — HEMOGLOBIN A1C: HEMOGLOBIN A1C: 7.6 % — AB (ref 4.6–6.5)

## 2016-12-31 ENCOUNTER — Encounter: Payer: Self-pay | Admitting: Internal Medicine

## 2016-12-31 ENCOUNTER — Telehealth: Payer: Self-pay | Admitting: Internal Medicine

## 2016-12-31 LAB — PSA, TOTAL AND FREE
PSA, % FREE: 27 % (ref >25)
PSA, Free: 0.3 ng/mL
PSA, Total: 1.1 ng/mL (ref <=4.0)

## 2016-12-31 MED ORDER — METFORMIN HCL 1000 MG PO TABS: 1000.0000 mg | ORAL_TABLET | Freq: Two times a day (BID) | ORAL | 1 refills | Status: DC

## 2016-12-31 NOTE — Telephone Encounter (Signed)
Please advise. I see that not all labs have been resulted.

## 2016-12-31 NOTE — Telephone Encounter (Signed)
Spoke with [facility], they are still waiting on the results of the PSA. Notified pts wife, new RX for Metformin has been sent to [facility]

## 2016-12-31 NOTE — Telephone Encounter (Signed)
psa - ? Pending or not done - please check in to that.   Blood counts, thyroid, kidney and liver tests are normal.  Urine is normal no protein.  Cholesterol is very good.   a1c is now 7.6% - it was previously 6.7 (9 months ago).  We should inc the metformin to 1000 mg twice a day

## 2016-12-31 NOTE — Telephone Encounter (Signed)
Pt wife called in and would like someone to call her asap with the lab results

## 2017-01-01 ENCOUNTER — Encounter: Payer: Self-pay | Admitting: Internal Medicine

## 2017-01-01 DIAGNOSIS — E119 Type 2 diabetes mellitus without complications: Secondary | ICD-10-CM

## 2017-03-05 ENCOUNTER — Encounter: Payer: Self-pay | Admitting: Internal Medicine

## 2017-03-05 DIAGNOSIS — I10 Essential (primary) hypertension: Secondary | ICD-10-CM

## 2017-03-05 DIAGNOSIS — E119 Type 2 diabetes mellitus without complications: Secondary | ICD-10-CM

## 2017-03-05 NOTE — Telephone Encounter (Signed)
Billy Fernandez - who can answer the insurance part of this. I am sure the labs are covered, but do not want to assume....Marland KitchenMarland Kitchen

## 2017-03-07 ENCOUNTER — Telehealth: Payer: Self-pay | Admitting: Internal Medicine

## 2017-03-07 NOTE — Telephone Encounter (Signed)
Script written

## 2017-03-07 NOTE — Telephone Encounter (Signed)
Please advise 

## 2017-03-07 NOTE — Telephone Encounter (Signed)
Patient's spouse is looking into lab coverage with patient.  Is requesting script with labs to be mailed to them due to insurance coverage.

## 2017-03-10 ENCOUNTER — Telehealth: Payer: Self-pay | Admitting: Internal Medicine

## 2017-03-10 NOTE — Telephone Encounter (Signed)
RX mailed to patient.

## 2017-05-30 LAB — HM COLONOSCOPY

## 2017-06-03 ENCOUNTER — Encounter: Payer: Self-pay | Admitting: Internal Medicine

## 2017-06-03 NOTE — Progress Notes (Unsigned)
Results entered and sent to scan  

## 2017-06-23 ENCOUNTER — Other Ambulatory Visit: Payer: Self-pay | Admitting: Internal Medicine

## 2017-06-23 MED ORDER — VALSARTAN-HYDROCHLOROTHIAZIDE 320-12.5 MG PO TABS: 1.0000 | ORAL_TABLET | Freq: Every day | ORAL | 1 refills | Status: DC

## 2017-06-23 NOTE — Telephone Encounter (Signed)
Wife left msg on triage stating husband is needing refill on his metformin & valsartan. Per chart metformin has already been refilled sent approval for valsartan...Billy Fernandez

## 2017-07-14 ENCOUNTER — Telehealth: Payer: Self-pay | Admitting: Internal Medicine

## 2017-07-14 NOTE — Telephone Encounter (Signed)
Patient's wife called in requesting medication in place of valsartan for patient.  Is requesting call back b/c she does have questions in regard to valsartan recall.  Wife does want another medication in the same tier as this medication.  She is going to get with [employer] to find out what medication would be covered in the same tier while waiting on call back.

## 2017-07-15 MED ORDER — VALSARTAN-HYDROCHLOROTHIAZIDE 320-12.5 MG PO TABS: 1.0000 | ORAL_TABLET | Freq: Every day | ORAL | 1 refills | Status: DC

## 2017-07-15 NOTE — Telephone Encounter (Signed)
Spoke with pts wife, states she has found out what manufactures have recalled Valsartan. Requesting that RX be sent to [facility] bc they have the manufacture that has not been recalled. RX has been sent. Follow-up appt scheduled for pt.

## 2017-07-15 NOTE — Telephone Encounter (Signed)
Pt wife called you back please call back

## 2017-07-15 NOTE — Telephone Encounter (Signed)
Or pt wife to call back and discuss.

## 2017-07-17 ENCOUNTER — Encounter: Payer: Self-pay | Admitting: Internal Medicine

## 2017-07-17 MED ORDER — LOSARTAN POTASSIUM-HCTZ 100-12.5 MG PO TABS: 1.0000 | ORAL_TABLET | Freq: Every day | ORAL | 3 refills | Status: DC

## 2017-07-17 NOTE — Telephone Encounter (Signed)
Pt wants to switch from this med due to the recall and would like 90 day supply sent into [facility]

## 2017-08-08 ENCOUNTER — Telehealth: Payer: Self-pay | Admitting: Internal Medicine

## 2017-08-08 NOTE — Telephone Encounter (Signed)
Called pt gave MD response. Pt states he actually have enough meds, but still will cone in to have labs check. Inform pt he can jus go to the lab to have done....Johny Chess

## 2017-08-08 NOTE — Telephone Encounter (Signed)
Pt wife called in and said that pt needs needs refill on his metFORMIN (GLUCOPHAGE) 1000 MG tablet [[id number]]    [facility]

## 2017-09-16 ENCOUNTER — Other Ambulatory Visit: Payer: Self-pay | Admitting: Internal Medicine

## 2017-09-28 ENCOUNTER — Encounter: Payer: Self-pay | Admitting: Internal Medicine

## 2017-10-07 ENCOUNTER — Telehealth: Payer: Self-pay | Admitting: Internal Medicine

## 2017-10-07 NOTE — Telephone Encounter (Signed)
Spouse, michelle would like a call back in regard to some concerns she has about patients current medication. Has heard different effects aspirin has had on patients who do not have history of a heart attack.  Would like to know how this would effect patient. Also, has heard side effects of lipitor and would like to know Dr. Quay Burow thoughts on patient taking this medication.    Spouse would also like to know if Dr. Quay Burow would be able to take her on as a patient?

## 2017-10-07 NOTE — Telephone Encounter (Signed)
Having diabetes at least doubles the risk of heart disease therefore a daily baby aspirin is recommended to help prevent this.  There is a small risk of stomach bleeding with the aspirin.   I think the benefits of lipitor outweigh the possible risks - some studies have shown possible risks with long term use, but still the benefits outweigh the risk.    Yes, I can take her on as a patient

## 2017-10-08 NOTE — Telephone Encounter (Signed)
Spoke with pts wife to inform. MDs response was understood.

## 2017-12-13 ENCOUNTER — Other Ambulatory Visit: Payer: Self-pay | Admitting: Internal Medicine

## 2017-12-18 NOTE — Progress Notes (Addendum)
Subjective:    Patient ID: Billy Fernandez, male    DOB: June 29, 1951, 66 y.o.   MRN: [id number]  HPI He is here for a physical exam.   He is exercising more regularly - walking and jumping rope.  He has been eating more bread recently and has gained weight.  He thinks he has gained 6 lbs in the past 6 weeks.  His sugars at home are less than 112.    Medications and allergies reviewed with patient and updated if appropriate.  Patient Active Problem List   Diagnosis Date Noted  . Diabetes mellitus type 2 in nonobese ([place]) 07/01/2014  . Eczema 06/29/2013  . Hyperlipidemia 06/29/2013  . Low libido 05/28/2013  . Essential hypertension, benign 05/28/2013    Current Outpatient Medications on File Prior to Visit  Medication Sig Dispense Refill  . aspirin 81 MG tablet Take 81 mg by mouth daily.    Marland Kitchen atorvastatin (LIPITOR) 20 MG tablet Take 1 tablet (20 mg total) by mouth daily. 90 tablet 3  . carvedilol (COREG) 6.25 MG tablet Take 1 tablet (6.25 mg total) by mouth 2 (two) times daily. 180 tablet 3  . fish oil-omega-3 fatty acids 1000 MG capsule Take 2 g by mouth daily.    Marland Kitchen losartan-hydrochlorothiazide (HYZAAR) 100-12.5 MG tablet Take 1 tablet by mouth daily. 90 tablet 3  . MAGNESIUM CITRATE PO Take by mouth. 400mg  1 tablet qd    . metFORMIN (GLUCOPHAGE) 1000 MG tablet TAKE ONE TABLET BY MOUTH TWICE DAILY WITH A MEAL 180 tablet 0  . zinc gluconate 50 MG tablet Take 50 mg by mouth daily.     No current facility-administered medications on file prior to visit.     Past Medical History:  Diagnosis Date  . Hypertension     No past surgical history on file.  Social History   Socioeconomic History  . Marital status: Married    Spouse name: None  . Number of children: None  . Years of education: None  . Highest education level: None  Social Needs  . Financial resource strain: None  . Food insecurity - worry: None  . Food insecurity - inability: None  . Transportation needs -  medical: None  . Transportation needs - non-medical: None  Occupational History  . None  Tobacco Use  . Smoking status: Former Research scientist (life sciences)  . Smokeless tobacco: Never Used  Substance and Sexual Activity  . Alcohol use: Yes    Alcohol/week: 0.6 oz    Types: 1 Cans of beer per week  . Drug use: No  . Sexual activity: Yes  Other Topics Concern  . None  Social History Narrative  . None    Family History  Problem Relation Age of Onset  . Breast cancer Mother   . Cancer Mother   . Diabetes Mother   . Heart disease Father   . Hyperlipidemia Father   . Hypertension Father   . Diabetes Brother   . Heart disease Brother        cabg  . Alcohol abuse Neg Hx   . COPD Neg Hx   . Depression Neg Hx   . Drug abuse Neg Hx   . Early death Neg Hx   . Hearing loss Neg Hx   . Kidney disease Neg Hx   . Stroke Neg Hx     Review of Systems  Constitutional: Negative for chills and fever.  Eyes: Negative for visual disturbance.  Respiratory: Negative for cough, shortness  of breath and wheezing.   Cardiovascular: Positive for palpitations (chronic, intermittent). Negative for chest pain and leg swelling.  Gastrointestinal: Negative for abdominal pain, blood in stool, constipation, diarrhea and nausea.       Occ gerd with certain foods  Genitourinary: Negative for difficulty urinating, dysuria and hematuria.  Musculoskeletal: Positive for back pain (intermittent lower back pain). Negative for arthralgias and myalgias.  Skin: Negative for color change and rash.  Neurological: Negative for light-headedness and headaches.  Psychiatric/Behavioral: Negative for dysphoric mood. The patient is not nervous/anxious.        Objective:   Vitals:   12/19/17 0757  BP: 132/86  Pulse: (!) 57  Resp: 16  Temp: 98 F (36.7 C)  SpO2: 95%   Filed Weights   12/19/17 0757  Weight: 203 lb (92.1 kg)   Body mass index is 29.13 kg/m.  Wt Readings from Last 3 Encounters:  12/19/17 203 lb (92.1 kg)    12/13/16 216 lb (98 kg)  03/22/16 201 lb (91.2 kg)     Physical Exam Constitutional: He appears well-developed and well-nourished. No distress.  HENT:  Head: Normocephalic and atraumatic.  Right Ear: External ear normal.  Left Ear: External ear normal.  Mouth/Throat: Oropharynx is clear and moist.  Normal ear canals and TM b/l  Eyes: Conjunctivae and EOM are normal.  Neck: Neck supple. No tracheal deviation present. No thyromegaly present.  No carotid bruit  Cardiovascular: Normal rate, regular rhythm, normal heart sounds and intact distal pulses.   No murmur heard. Pulmonary/Chest: Effort normal and breath sounds normal. No respiratory distress. He has no wheezes. He has no rales.  Abdominal: Soft. He exhibits no distension. There is no tenderness.  Genitourinary: smooth prostate, no nodules, slightly enlarged Musculoskeletal: He exhibits no edema.  Lymphadenopathy:   He has no cervical adenopathy.  Skin: Skin is warm and dry. He is not diaphoretic.  Psychiatric: He has a normal mood and affect. His behavior is normal.         Assessment & Plan:   Physical exam: Screening blood work  ordered Immunizations deferred vaccines Colonoscopy   Up to date  Eye exams   [facility] to [facility]  Last done 2017 Exercise  Regular - walking, jumping rope Weight  advised weight loss Skin  Sees derm annually Substance abuse   none  See Problem List for Assessment and Plan of chronic medical problems.

## 2017-12-18 NOTE — Patient Instructions (Addendum)
Test(s) ordered today. Your results will be released to MyChart (or called to you) after review, usually within 72hours after test completion. If any changes need to be made, you will be notified at that same time.  All other Health Maintenance issues reviewed.   All recommended immunizations and age-appropriate screenings are up-to-date or discussed.  No immunizations administered today.   Medications reviewed and updated.  No changes recommended at this time.  Your prescription(s) have been submitted to your pharmacy. Please take as directed and contact our office if you believe you are having problem(s) with the medication(s).   Please followup in 6 months    Health Maintenance, Male A healthy lifestyle and preventive care is important for your health and wellness. Ask your health care provider about what schedule of regular examinations is right for you. What should I know about weight and diet? Eat a Healthy Diet  Eat plenty of vegetables, fruits, whole grains, low-fat dairy products, and lean protein.  Do not eat a lot of foods high in solid fats, added sugars, or salt.  Maintain a Healthy Weight Regular exercise can help you achieve or maintain a healthy weight. You should:  Do at least 150 minutes of exercise each week. The exercise should increase your heart rate and make you sweat (moderate-intensity exercise).  Do strength-training exercises at least twice a week.  Watch Your Levels of Cholesterol and Blood Lipids  Have your blood tested for lipids and cholesterol every 5 years starting at 65 years of age. If you are at high risk for heart disease, you should start having your blood tested when you are 65 years old. You may need to have your cholesterol levels checked more often if: ? Your lipid or cholesterol levels are high. ? You are older than 65 years of age. ? You are at high risk for heart disease.  What should I know about cancer screening? Many types of  cancers can be detected early and may often be prevented. Lung Cancer  You should be screened every year for lung cancer if: ? You are a current smoker who has smoked for at least 30 years. ? You are a former smoker who has quit within the past 15 years.  Talk to your health care provider about your screening options, when you should start screening, and how often you should be screened.  Colorectal Cancer  Routine colorectal cancer screening usually begins at 65 years of age and should be repeated every 5-10 years until you are 65 years old. You may need to be screened more often if early forms of precancerous polyps or small growths are found. Your health care provider may recommend screening at an earlier age if you have risk factors for colon cancer.  Your health care provider may recommend using home test kits to check for hidden blood in the stool.  A small camera at the end of a tube can be used to examine your colon (sigmoidoscopy or colonoscopy). This checks for the earliest forms of colorectal cancer.  Prostate and Testicular Cancer  Depending on your age and overall health, your health care provider may do certain tests to screen for prostate and testicular cancer.  Talk to your health care provider about any symptoms or concerns you have about testicular or prostate cancer.  Skin Cancer  Check your skin from head to toe regularly.  Tell your health care provider about any new moles or changes in moles, especially if: ? There is a   change in a mole's size, shape, or color. ? You have a mole that is larger than a pencil eraser.  Always use sunscreen. Apply sunscreen liberally and repeat throughout the day.  Protect yourself by wearing long sleeves, pants, a wide-brimmed hat, and sunglasses when outside.  What should I know about heart disease, diabetes, and high blood pressure?  If you are 18-39 years of age, have your blood pressure checked every 3-5 years. If you are  40 years of age or older, have your blood pressure checked every year. You should have your blood pressure measured twice-once when you are at a hospital or clinic, and once when you are not at a hospital or clinic. Record the average of the two measurements. To check your blood pressure when you are not at a hospital or clinic, you can use: ? An automated blood pressure machine at a pharmacy. ? A home blood pressure monitor.  Talk to your health care provider about your target blood pressure.  If you are between 45-79 years old, ask your health care provider if you should take aspirin to prevent heart disease.  Have regular diabetes screenings by checking your fasting blood sugar level. ? If you are at a normal weight and have a low risk for diabetes, have this test once every three years after the age of 45. ? If you are overweight and have a high risk for diabetes, consider being tested at a younger age or more often.  A one-time screening for abdominal aortic aneurysm (AAA) by ultrasound is recommended for men aged 65-75 years who are current or former smokers. What should I know about preventing infection? Hepatitis B If you have a higher risk for hepatitis B, you should be screened for this virus. Talk with your health care provider to find out if you are at risk for hepatitis B infection. Hepatitis C Blood testing is recommended for:  Everyone born from 1945 through 1965.  Anyone with known risk factors for hepatitis C.  Sexually Transmitted Diseases (STDs)  You should be screened each year for STDs including gonorrhea and chlamydia if: ? You are sexually active and are younger than 65 years of age. ? You are older than 65 years of age and your health care provider tells you that you are at risk for this type of infection. ? Your sexual activity has changed since you were last screened and you are at an increased risk for chlamydia or gonorrhea. Ask your health care provider if you  are at risk.  Talk with your health care provider about whether you are at high risk of being infected with HIV. Your health care provider may recommend a prescription medicine to help prevent HIV infection.  What else can I do?  Schedule regular health, dental, and eye exams.  Stay current with your vaccines (immunizations).  Do not use any tobacco products, such as cigarettes, chewing tobacco, and e-cigarettes. If you need help quitting, ask your health care provider.  Limit alcohol intake to no more than 2 drinks per day. One drink equals 12 ounces of beer, 5 ounces of wine, or 1 ounces of hard liquor.  Do not use street drugs.  Do not share needles.  Ask your health care provider for help if you need support or information about quitting drugs.  Tell your health care provider if you often feel depressed.  Tell your health care provider if you have ever been abused or do not feel safe at home.   This information is not intended to replace advice given to you by your health care provider. Make sure you discuss any questions you have with your health care provider. Document Released: 06/06/2008 Document Revised: 08/07/2016 Document Reviewed: 09/12/2015 Elsevier Interactive Patient Education  2018 [employer]vier Inc[employer]  

## 2017-12-19 ENCOUNTER — Ambulatory Visit (INDEPENDENT_AMBULATORY_CARE_PROVIDER_SITE_OTHER): Payer: BLUE CROSS/BLUE SHIELD | Admitting: Internal Medicine

## 2017-12-19 ENCOUNTER — Ambulatory Visit: Payer: BLUE CROSS/BLUE SHIELD | Admitting: Internal Medicine

## 2017-12-19 ENCOUNTER — Other Ambulatory Visit (INDEPENDENT_AMBULATORY_CARE_PROVIDER_SITE_OTHER): Payer: BLUE CROSS/BLUE SHIELD

## 2017-12-19 ENCOUNTER — Encounter: Payer: Self-pay | Admitting: Internal Medicine

## 2017-12-19 VITALS — BP 132/86 | HR 57 | Temp 98.0°F | Resp 16 | Ht 70.0 in | Wt 203.0 lb

## 2017-12-19 DIAGNOSIS — Z0000 Encounter for general adult medical examination without abnormal findings: Secondary | ICD-10-CM

## 2017-12-19 DIAGNOSIS — E7849 Other hyperlipidemia: Secondary | ICD-10-CM

## 2017-12-19 DIAGNOSIS — E119 Type 2 diabetes mellitus without complications: Secondary | ICD-10-CM

## 2017-12-19 DIAGNOSIS — I10 Essential (primary) hypertension: Secondary | ICD-10-CM

## 2017-12-19 LAB — COMPREHENSIVE METABOLIC PANEL
ALT: 16 U/L (ref 0–53)
AST: 17 U/L (ref 0–37)
Albumin: 4.2 g/dL (ref 3.5–5.2)
Alkaline Phosphatase: 36 U/L — ABNORMAL LOW (ref 39–117)
BUN: 18 mg/dL (ref 6–23)
CALCIUM: 9.4 mg/dL (ref 8.4–10.5)
CHLORIDE: 102 meq/L (ref 96–112)
CO2: 30 meq/L (ref 19–32)
Creatinine, Ser: 1.14 mg/dL (ref 0.40–1.50)
GFR: 68.33 mL/min (ref >60.00)
Glucose, Bld: 113 mg/dL — ABNORMAL HIGH (ref 70–99)
POTASSIUM: 4.5 meq/L (ref 3.5–5.1)
Sodium: 140 mEq/L (ref 135–145)
Total Bilirubin: 0.7 mg/dL (ref 0.2–1.2)
Total Protein: 7.1 g/dL (ref 6.0–8.3)

## 2017-12-19 LAB — LIPID PANEL
Cholesterol: 139 mg/dL (ref 0–200)
HDL: 40.5 mg/dL (ref >39.00)
LDL Cholesterol: 71 mg/dL (ref 0–99)
NONHDL: 98.1
Total CHOL/HDL Ratio: 3
Triglycerides: 136 mg/dL (ref 0.0–149.0)
VLDL: 27.2 mg/dL (ref 0.0–40.0)

## 2017-12-19 LAB — CBC WITH DIFFERENTIAL/PLATELET
BASOS PCT: 0.6 % (ref 0.0–3.0)
Basophils Absolute: 0.1 10*3/uL (ref 0.0–0.1)
Eosinophils Absolute: 0.2 10*3/uL (ref 0.0–0.7)
Eosinophils Relative: 2.1 % (ref 0.0–5.0)
HEMATOCRIT: 47.1 % (ref 39.0–52.0)
HEMOGLOBIN: 15.9 g/dL (ref 13.0–17.0)
LYMPHS PCT: 21.4 % (ref 12.0–46.0)
Lymphs Abs: 1.7 10*3/uL (ref 0.7–4.0)
MCHC: 33.8 g/dL (ref 30.0–36.0)
MCV: 88.9 fl (ref 78.0–100.0)
MONOS PCT: 8.6 % (ref 3.0–12.0)
Monocytes Absolute: 0.7 10*3/uL (ref 0.1–1.0)
Neutro Abs: 5.4 10*3/uL (ref 1.4–7.7)
Neutrophils Relative %: 67.3 % (ref 43.0–77.0)
PLATELETS: 276 10*3/uL (ref 150.0–400.0)
RBC: 5.3 Mil/uL (ref 4.22–5.81)
RDW: 13.4 % (ref 11.5–15.5)
WBC: 8 10*3/uL (ref 4.0–10.5)

## 2017-12-19 LAB — TSH: TSH: 1.23 u[IU]/mL (ref 0.35–4.50)

## 2017-12-19 LAB — HEMOGLOBIN A1C: Hgb A1c MFr Bld: 6.6 % — ABNORMAL HIGH (ref 4.6–6.5)

## 2017-12-19 NOTE — Assessment & Plan Note (Signed)
Check lipid panel  Continue daily statin Regular exercise and healthy diet encouraged  

## 2017-12-19 NOTE — Assessment & Plan Note (Signed)
Check a1c Low sugar / carb diet Stressed regular exercise, weight loss  

## 2017-12-19 NOTE — Assessment & Plan Note (Signed)
BP well controlled Current regimen effective and well tolerated Continue current medications at current doses cmp  

## 2017-12-22 ENCOUNTER — Encounter: Payer: Self-pay | Admitting: Internal Medicine

## 2017-12-22 LAB — PSA, TOTAL AND FREE
PSA, % Free: 20 % (calc) — ABNORMAL LOW (ref >25)
PSA, FREE: 0.3 ng/mL
PSA, Total: 1.5 ng/mL (ref <=4.0)

## 2018-01-18 ENCOUNTER — Other Ambulatory Visit: Payer: Self-pay | Admitting: Internal Medicine

## 2018-01-26 ENCOUNTER — Encounter: Payer: BLUE CROSS/BLUE SHIELD | Admitting: Internal Medicine

## 2018-03-23 DIAGNOSIS — H524 Presbyopia: Secondary | ICD-10-CM | POA: Diagnosis not present

## 2018-04-29 ENCOUNTER — Other Ambulatory Visit: Payer: Self-pay | Admitting: Internal Medicine

## 2018-04-29 MED ORDER — METFORMIN HCL 1000 MG PO TABS: 1000.0000 mg | ORAL_TABLET | Freq: Two times a day (BID) | ORAL | 0 refills | Status: DC

## 2018-04-29 NOTE — Telephone Encounter (Signed)
Phone call to pt's. Wife, per her request.  Reported that they are on vacation, and anticipated to only be away from home for one month.  Stated their vacation has been extended by at least 2-3 weeks, due to weather, and they did not pack enough medication to account for the extra days they will be away from home.  Requested a refill on Metformin at the [facility] in [place], [place].; wife stated this will be available when they travel through areas, that have a [employer].  Stated the pt. has about 2 weeks worth of Metformin, and about a 30 day supply of Losartan-HCTZ.  Reported the pt. does not need the Losartan refilled at this time.  Advised will order refill of the Metformin at this time.  Last refill of Metformin; 12/14/17; #180; no refills Last office visit: 12/19/17 Last Hbg. A1C:  6.6 on 12/19/17  Refilled Metformin per protocol

## 2018-05-18 ENCOUNTER — Other Ambulatory Visit: Payer: Self-pay | Admitting: Internal Medicine

## 2018-06-11 DIAGNOSIS — L821 Other seborrheic keratosis: Secondary | ICD-10-CM | POA: Diagnosis not present

## 2018-06-11 DIAGNOSIS — D2272 Melanocytic nevi of left lower limb, including hip: Secondary | ICD-10-CM | POA: Diagnosis not present

## 2018-06-11 DIAGNOSIS — L820 Inflamed seborrheic keratosis: Secondary | ICD-10-CM | POA: Diagnosis not present

## 2018-06-11 DIAGNOSIS — D2271 Melanocytic nevi of right lower limb, including hip: Secondary | ICD-10-CM | POA: Diagnosis not present

## 2018-06-11 DIAGNOSIS — L905 Scar conditions and fibrosis of skin: Secondary | ICD-10-CM | POA: Diagnosis not present

## 2018-06-11 DIAGNOSIS — L578 Other skin changes due to chronic exposure to nonionizing radiation: Secondary | ICD-10-CM | POA: Diagnosis not present

## 2018-06-11 DIAGNOSIS — D1801 Hemangioma of skin and subcutaneous tissue: Secondary | ICD-10-CM | POA: Diagnosis not present

## 2018-06-11 DIAGNOSIS — L570 Actinic keratosis: Secondary | ICD-10-CM | POA: Diagnosis not present

## 2018-08-12 ENCOUNTER — Other Ambulatory Visit: Payer: Self-pay | Admitting: Internal Medicine

## 2018-09-15 ENCOUNTER — Other Ambulatory Visit: Payer: Self-pay | Admitting: Internal Medicine

## 2018-09-17 ENCOUNTER — Other Ambulatory Visit: Payer: Self-pay | Admitting: Internal Medicine

## 2018-10-08 DIAGNOSIS — Z0100 Encounter for examination of eyes and vision without abnormal findings: Secondary | ICD-10-CM | POA: Diagnosis not present

## 2018-12-24 NOTE — Patient Instructions (Addendum)
Tests ordered today. Your results will be released to MyChart (or called to you) after review, usually within 72hours after test completion. If any changes need to be made, you will be notified at that same time.  All other Health Maintenance issues reviewed.   All recommended immunizations and age-appropriate screenings are up-to-date or discussed.  No immunizations administered today.   Medications reviewed and updated.  Changes include :   none    Please followup in one year    Health Maintenance, Male A healthy lifestyle and preventive care is important for your health and wellness. Ask your health care provider about what schedule of regular examinations is right for you. What should I know about weight and diet? Eat a Healthy Diet  Eat plenty of vegetables, fruits, whole grains, low-fat dairy products, and lean protein.  Do not eat a lot of foods high in solid fats, added sugars, or salt.  Maintain a Healthy Weight Regular exercise can help you achieve or maintain a healthy weight. You should:  Do at least 150 minutes of exercise each week. The exercise should increase your heart rate and make you sweat (moderate-intensity exercise).  Do strength-training exercises at least twice a week. Watch Your Levels of Cholesterol and Blood Lipids  Have your blood tested for lipids and cholesterol every 5 years starting at 67 years of age. If you are at high risk for heart disease, you should start having your blood tested when you are 67 years old. You may need to have your cholesterol levels checked more often if: ? Your lipid or cholesterol levels are high. ? You are older than 67 years of age. ? You are at high risk for heart disease. What should I know about cancer screening? Many types of cancers can be detected early and may often be prevented. Lung Cancer  You should be screened every year for lung cancer if: ? You are a current smoker who has smoked for at least 30  years. ? You are a former smoker who has quit within the past 15 years.  Talk to your health care provider about your screening options, when you should start screening, and how often you should be screened. Colorectal Cancer  Routine colorectal cancer screening usually begins at 67 years of age and should be repeated every 5-10 years until you are 67 years old. You may need to be screened more often if early forms of precancerous polyps or small growths are found. Your health care provider may recommend screening at an earlier age if you have risk factors for colon cancer.  Your health care provider may recommend using home test kits to check for hidden blood in the stool.  A small camera at the end of a tube can be used to examine your colon (sigmoidoscopy or colonoscopy). This checks for the earliest forms of colorectal cancer. Prostate and Testicular Cancer  Depending on your age and overall health, your health care provider may do certain tests to screen for prostate and testicular cancer.  Talk to your health care provider about any symptoms or concerns you have about testicular or prostate cancer. Skin Cancer  Check your skin from head to toe regularly.  Tell your health care provider about any new moles or changes in moles, especially if: ? There is a change in a mole's size, shape, or color. ? You have a mole that is larger than a pencil eraser.  Always use sunscreen. Apply sunscreen liberally and repeat throughout the   day.  Protect yourself by wearing long sleeves, pants, a wide-brimmed hat, and sunglasses when outside. What should I know about heart disease, diabetes, and high blood pressure?  If you are 18-39 years of age, have your blood pressure checked every 3-5 years. If you are 40 years of age or older, have your blood pressure checked every year. You should have your blood pressure measured twice-once when you are at a hospital or clinic, and once when you are not at a  hospital or clinic. Record the average of the two measurements. To check your blood pressure when you are not at a hospital or clinic, you can use: ? An automated blood pressure machine at a pharmacy. ? A home blood pressure monitor.  Talk to your health care provider about your target blood pressure.  If you are between 45-79 years old, ask your health care provider if you should take aspirin to prevent heart disease.  Have regular diabetes screenings by checking your fasting blood sugar level. ? If you are at a normal weight and have a low risk for diabetes, have this test once every three years after the age of 45. ? If you are overweight and have a high risk for diabetes, consider being tested at a younger age or more often.  A one-time screening for abdominal aortic aneurysm (AAA) by ultrasound is recommended for men aged 65-75 years who are current or former smokers. What should I know about preventing infection? Hepatitis B If you have a higher risk for hepatitis B, you should be screened for this virus. Talk with your health care provider to find out if you are at risk for hepatitis B infection. Hepatitis C Blood testing is recommended for:  Everyone born from 1945 through 1965.  Anyone with known risk factors for hepatitis C. Sexually Transmitted Diseases (STDs)  You should be screened each year for STDs including gonorrhea and chlamydia if: ? You are sexually active and are younger than 67 years of age. ? You are older than 67 years of age and your health care provider tells you that you are at risk for this type of infection. ? Your sexual activity has changed since you were last screened and you are at an increased risk for chlamydia or gonorrhea. Ask your health care provider if you are at risk.  Talk with your health care provider about whether you are at high risk of being infected with HIV. Your health care provider may recommend a prescription medicine to help prevent  HIV infection. What else can I do?  Schedule regular health, dental, and eye exams.  Stay current with your vaccines (immunizations).  Do not use any tobacco products, such as cigarettes, chewing tobacco, and e-cigarettes. If you need help quitting, ask your health care provider.  Limit alcohol intake to no more than 2 drinks per day. One drink equals 12 ounces of beer, 5 ounces of wine, or 1 ounces of hard liquor.  Do not use street drugs.  Do not share needles.  Ask your health care provider for help if you need support or information about quitting drugs.  Tell your health care provider if you often feel depressed.  Tell your health care provider if you have ever been abused or do not feel safe at home. This information is not intended to replace advice given to you by your health care provider. Make sure you discuss any questions you have with your health care provider. Document Released: 06/06/2008 Document Revised:   08/07/2016 Document Reviewed: 09/12/2015 Elsevier Interactive Patient Education  2019 Elsevier Inc.  

## 2018-12-24 NOTE — Progress Notes (Signed)
Subjective:    Patient ID: Billy Fernandez, male    DOB: [birth year], 68 y.o.   MRN: [id number]  HPI He is here for a physical exam.   He retired last year.  He denies any changes in his health.  His sugars are highest at 101 in the morning.  He monitors his sugars regularly.  He is exercising regularly and he has lost weight.  He has no concerns.  Medications and allergies reviewed with patient and updated if appropriate.  Patient Active Problem List   Diagnosis Date Noted  . Diabetes mellitus type 2 in nonobese ([place]) 07/01/2014  . Eczema 06/29/2013  . Hyperlipidemia 06/29/2013  . Low libido 05/28/2013  . Essential hypertension, benign 05/28/2013    Current Outpatient Medications on File Prior to Visit  Medication Sig Dispense Refill  . aspirin 81 MG tablet Take 81 mg by mouth daily.    Marland Kitchen atorvastatin (LIPITOR) 20 MG tablet TAKE 1 TABLET BY MOUTH ONCE DAILY 90 tablet 0  . carvedilol (COREG) 6.25 MG tablet Take 1 tablet (6.25 mg total) by mouth 2 (two) times daily. Keep scheduled appt for future refills 180 tablet 0  . fish oil-omega-3 fatty acids 1000 MG capsule Take 2 g by mouth daily.    Marland Kitchen losartan-hydrochlorothiazide (HYZAAR) 100-12.5 MG tablet Take 1 tablet by mouth daily. Keep scheduled appt for future refills 90 tablet 0  . MAGNESIUM CITRATE PO Take by mouth. 400mg  1 tablet qd    . metFORMIN (GLUCOPHAGE) 1000 MG tablet Take 1 tablet (1,000 mg total) by mouth 2 (two) times daily with a meal. Keep scheduled appt for future refills 180 tablet 0  . zinc gluconate 50 MG tablet Take 50 mg by mouth daily.     No current facility-administered medications on file prior to visit.     Past Medical History:  Diagnosis Date  . Hypertension     No past surgical history on file.  Social History   Socioeconomic History  . Marital status: Married    Spouse name: Not on file  . Number of children: Not on file  . Years of education: Not on file  . Highest education level:  Not on file  Occupational History  . Not on file  Social Needs  . Financial resource strain: Not on file  . Food insecurity:    Worry: Not on file    Inability: Not on file  . Transportation needs:    Medical: Not on file    Non-medical: Not on file  Tobacco Use  . Smoking status: Former Research scientist (life sciences)  . Smokeless tobacco: Never Used  Substance and Sexual Activity  . Alcohol use: Yes    Alcohol/week: 1.0 standard drinks    Types: 1 Cans of beer per week  . Drug use: No  . Sexual activity: Yes  Lifestyle  . Physical activity:    Days per week: Not on file    Minutes per session: Not on file  . Stress: Not on file  Relationships  . Social connections:    Talks on phone: Not on file    Gets together: Not on file    Attends religious service: Not on file    Active member of club or organization: Not on file    Attends meetings of clubs or organizations: Not on file    Relationship status: Not on file  Other Topics Concern  . Not on file  Social History Narrative  . Not on file  Family History  Problem Relation Age of Onset  . Breast cancer Mother   . Cancer Mother   . Diabetes Mother   . Heart disease Father   . Hyperlipidemia Father   . Hypertension Father   . Diabetes Brother   . Heart disease Brother        cabg  . Alcohol abuse Neg Hx   . COPD Neg Hx   . Depression Neg Hx   . Drug abuse Neg Hx   . Early death Neg Hx   . Hearing loss Neg Hx   . Kidney disease Neg Hx   . Stroke Neg Hx     Review of Systems  Constitutional: Negative for chills and fever.  Eyes: Negative for visual disturbance.  Respiratory: Negative for cough, shortness of breath and wheezing.   Cardiovascular: Positive for palpitations (occ, chronic - no change). Negative for chest pain and leg swelling.  Gastrointestinal: Negative for abdominal pain, blood in stool, constipation, diarrhea and nausea.       GERD rare Certain foods  Genitourinary: Negative for difficulty urinating, dysuria  and hematuria.  Musculoskeletal: Positive for arthralgias (hands - stiffness). Negative for back pain.  Skin: Negative for color change and rash.  Neurological: Positive for headaches (occ). Negative for light-headedness.  Psychiatric/Behavioral: Negative for dysphoric mood. The patient is not nervous/anxious.        Objective:   Vitals:   12/25/18 0809  BP: 122/80  Pulse: (!) 56  Resp: 16  Temp: (!) 97.5 F (36.4 C)  SpO2: 99%   Filed Weights   12/25/18 0809  Weight: 192 lb 6.4 oz (87.3 kg)   Body mass index is 27.61 kg/m.  Wt Readings from Last 3 Encounters:  12/25/18 192 lb 6.4 oz (87.3 kg)  12/19/17 203 lb (92.1 kg)  12/13/16 216 lb (98 kg)     Physical Exam Constitutional: He appears well-developed and well-nourished. No distress.  HENT:  Head: Normocephalic and atraumatic.  Right Ear: External ear normal.  Left Ear: External ear normal.  Mouth/Throat: Oropharynx is clear and moist.  Normal ear canals and TM b/l  Eyes: Conjunctivae and EOM are normal.  Neck: Neck supple. No tracheal deviation present. No thyromegaly present.  No carotid bruit  Cardiovascular: Normal rate, regular rhythm, normal heart sounds and intact distal pulses.   No murmur heard. Pulmonary/Chest: Effort normal and breath sounds normal. No respiratory distress. He has no wheezes. He has no rales.  Abdominal: Soft. He exhibits no distension. There is no tenderness.  Genitourinary: deferred  Musculoskeletal: He exhibits no edema.  Lymphadenopathy:   He has no cervical adenopathy.  Skin: Skin is warm and dry. He is not diaphoretic.  Psychiatric: He has a normal mood and affect. His behavior is normal.    Diabetic Foot Exam - Simple   Simple Foot Form Diabetic Foot exam was performed with the following findings:  Yes 12/25/2018  9:09 AM  Visual Inspection No deformities, no ulcerations, no other skin breakdown bilaterally:  Yes Sensation Testing Intact to touch and monofilament testing  bilaterally:  Yes Pulse Check Posterior Tibialis and Dorsalis pulse intact bilaterally:  Yes Comments         Assessment & Plan:   Physical exam: Screening blood work  ordered Immunizations   Prevnar due, flu due, discussed shingles  - deferred all Colonoscopy  Up to date  Eye exams  Up to date  - [facility] EKG   Done 2017 Exercise  Regular - walking,  jumps rope Weight  Good for age Skin   No concerns Substance abuse  none  See Problem List for Assessment and Plan of chronic medical problems.   Follow-up annually

## 2018-12-25 ENCOUNTER — Ambulatory Visit (INDEPENDENT_AMBULATORY_CARE_PROVIDER_SITE_OTHER): Payer: Medicare HMO | Admitting: Internal Medicine

## 2018-12-25 ENCOUNTER — Other Ambulatory Visit: Payer: Medicare HMO

## 2018-12-25 ENCOUNTER — Encounter: Payer: Self-pay | Admitting: Internal Medicine

## 2018-12-25 VITALS — BP 122/80 | HR 56 | Temp 97.5°F | Resp 16 | Ht 70.0 in | Wt 192.4 lb

## 2018-12-25 DIAGNOSIS — I10 Essential (primary) hypertension: Secondary | ICD-10-CM

## 2018-12-25 DIAGNOSIS — E119 Type 2 diabetes mellitus without complications: Secondary | ICD-10-CM

## 2018-12-25 DIAGNOSIS — Z125 Encounter for screening for malignant neoplasm of prostate: Secondary | ICD-10-CM | POA: Diagnosis not present

## 2018-12-25 DIAGNOSIS — Z0000 Encounter for general adult medical examination without abnormal findings: Secondary | ICD-10-CM | POA: Diagnosis not present

## 2018-12-25 DIAGNOSIS — E7849 Other hyperlipidemia: Secondary | ICD-10-CM

## 2018-12-25 NOTE — Assessment & Plan Note (Addendum)
Check A1c Continue metformin 1000 mg twice daily-can adjust if needed Has lost weight and is exercising regularly-we will continue regular exercise Compliant with diabetic diet Eye exam up-to-date Foot exam normal Will continue to follow-up annually

## 2018-12-25 NOTE — Assessment & Plan Note (Signed)
BP well controlled Current regimen effective and well tolerated Continue current medications at current doses CMP 

## 2018-12-25 NOTE — Assessment & Plan Note (Signed)
Check lipid panel  Continue daily statin Regular exercise and healthy diet encouraged  

## 2018-12-26 ENCOUNTER — Encounter: Payer: Self-pay | Admitting: Internal Medicine

## 2018-12-26 LAB — COMPREHENSIVE METABOLIC PANEL
A/G RATIO: 1.8 (ref 1.2–2.2)
ALT: 17 IU/L (ref 0–44)
AST: 15 IU/L (ref 0–40)
Albumin: 4.4 g/dL (ref 3.6–4.8)
Alkaline Phosphatase: 43 IU/L (ref 39–117)
BUN/Creatinine Ratio: 19 (ref 10–24)
BUN: 21 mg/dL (ref 8–27)
Bilirubin Total: 0.9 mg/dL (ref 0.0–1.2)
CO2: 27 mmol/L (ref 20–29)
Calcium: 10.1 mg/dL (ref 8.6–10.2)
Chloride: 103 mmol/L (ref 96–106)
Creatinine, Ser: 1.13 mg/dL (ref 0.76–1.27)
GFR calc Af Amer: 78 mL/min/{1.73_m2} (ref >59)
GFR calc non Af Amer: 67 mL/min/{1.73_m2} (ref >59)
Globulin, Total: 2.4 g/dL (ref 1.5–4.5)
Glucose: 101 mg/dL — ABNORMAL HIGH (ref 65–99)
Potassium: 4.6 mmol/L (ref 3.5–5.2)
Sodium: 143 mmol/L (ref 134–144)
Total Protein: 6.8 g/dL (ref 6.0–8.5)

## 2018-12-26 LAB — CBC WITH DIFFERENTIAL/PLATELET
Basophils Absolute: 0.1 10*3/uL (ref 0.0–0.2)
Basos: 1 %
EOS (ABSOLUTE): 0.2 10*3/uL (ref 0.0–0.4)
Eos: 3 %
HEMATOCRIT: 47.7 % (ref 37.5–51.0)
Hemoglobin: 16.3 g/dL (ref 13.0–17.7)
Immature Grans (Abs): 0 10*3/uL (ref 0.0–0.1)
Immature Granulocytes: 0 %
Lymphocytes Absolute: 1.7 10*3/uL (ref 0.7–3.1)
Lymphs: 25 %
MCH: 30.2 pg (ref 26.6–33.0)
MCHC: 34.2 g/dL (ref 31.5–35.7)
MCV: 88 fL (ref 79–97)
Monocytes Absolute: 0.7 10*3/uL (ref 0.1–0.9)
Monocytes: 10 %
Neutrophils Absolute: 4.4 10*3/uL (ref 1.4–7.0)
Neutrophils: 61 %
Platelets: 241 10*3/uL (ref 150–450)
RBC: 5.4 x10E6/uL (ref 4.14–5.80)
RDW: 12.9 % (ref 12.3–15.4)
WBC: 7 10*3/uL (ref 3.4–10.8)

## 2018-12-26 LAB — LIPID PANEL
Chol/HDL Ratio: 2.7 ratio (ref 0.0–5.0)
Cholesterol, Total: 132 mg/dL (ref 100–199)
HDL: 49 mg/dL (ref >39)
LDL CALC: 58 mg/dL (ref 0–99)
Triglycerides: 123 mg/dL (ref 0–149)
VLDL Cholesterol Cal: 25 mg/dL (ref 5–40)

## 2018-12-26 LAB — HEMOGLOBIN A1C
Est. average glucose Bld gHb Est-mCnc: 128 mg/dL
Hgb A1c MFr Bld: 6.1 % — ABNORMAL HIGH (ref 4.8–5.6)

## 2018-12-26 LAB — PSA: Prostate Specific Ag, Serum: 1.5 ng/mL (ref 0.0–4.0)

## 2018-12-26 LAB — TSH: TSH: 1.5 u[IU]/mL (ref 0.450–4.500)

## 2018-12-31 ENCOUNTER — Telehealth: Payer: Self-pay | Admitting: Internal Medicine

## 2018-12-31 DIAGNOSIS — G4700 Insomnia, unspecified: Secondary | ICD-10-CM | POA: Insufficient documentation

## 2018-12-31 DIAGNOSIS — G4709 Other insomnia: Secondary | ICD-10-CM

## 2018-12-31 MED ORDER — ZOLPIDEM TARTRATE 10 MG PO TABS: 10.0000 mg | ORAL_TABLET | Freq: Every evening | ORAL | 1 refills | Status: DC | PRN

## 2018-12-31 NOTE — Telephone Encounter (Signed)
I can send some to his pharmacy.  I am not sure what he is tried and most likely has tried things over-the-counter-if he is not he should try that first.  Ambien ideally should not be used long-term.  With long-term use it has been shown to affect memory and can make him dependent on it so that he will not be able to sleep without it.  rx sent

## 2018-12-31 NOTE — Telephone Encounter (Signed)
Copied from [place] [id number]. Topic: General - Other >> Dec 31, 2018  1:36 PM Lennox Solders wrote: Reason for CRM: pt saw dr burns on 12-25-2018 and he forgot to mention to her that he is having insomnia. Pt would like generic  ambien 10 mg . Pt has tried medication and it works. [facility] on [address]

## 2018-12-31 NOTE — Telephone Encounter (Signed)
Please advise 

## 2019-01-01 NOTE — Telephone Encounter (Signed)
Pts wife aware of the response.

## 2019-01-15 ENCOUNTER — Other Ambulatory Visit: Payer: Self-pay | Admitting: Internal Medicine

## 2019-05-05 ENCOUNTER — Other Ambulatory Visit: Payer: Self-pay | Admitting: Podiatry

## 2019-05-05 ENCOUNTER — Other Ambulatory Visit: Payer: Self-pay

## 2019-05-05 ENCOUNTER — Encounter: Payer: Self-pay | Admitting: Podiatry

## 2019-05-05 ENCOUNTER — Ambulatory Visit (INDEPENDENT_AMBULATORY_CARE_PROVIDER_SITE_OTHER): Payer: Medicare HMO

## 2019-05-05 ENCOUNTER — Ambulatory Visit: Payer: Medicare HMO | Admitting: Podiatry

## 2019-05-05 VITALS — Temp 97.3°F

## 2019-05-05 DIAGNOSIS — M79671 Pain in right foot: Secondary | ICD-10-CM

## 2019-05-05 DIAGNOSIS — M722 Plantar fascial fibromatosis: Secondary | ICD-10-CM

## 2019-05-05 DIAGNOSIS — M79672 Pain in left foot: Secondary | ICD-10-CM

## 2019-05-05 MED ORDER — MELOXICAM 15 MG PO TABS: 15.0000 mg | ORAL_TABLET | Freq: Every day | ORAL | 2 refills | Status: DC

## 2019-05-05 NOTE — Patient Instructions (Signed)

## 2019-05-06 NOTE — Progress Notes (Signed)
Subjective:   Patient ID: Billy Fernandez, male   DOB: 68 y.o.   MRN: [id number]   HPI Patient presents stating he jumped on his heels and they have been very sore and it is been going on a few weeks and it seems to be getting some better but they are still bothersome.  Patient does not smoke likes to be active   Review of Systems  All other systems reviewed and are negative.       Objective:  Physical Exam Vitals signs and nursing note reviewed.  Constitutional:      Appearance: He is well-developed.  Pulmonary:     Effort: Pulmonary effort is normal.  Musculoskeletal: Normal range of motion.  Skin:    General: Skin is warm.  Neurological:     Mental Status: He is alert.     Neurovascular status found to be intact muscle strength is adequate range of motion within normal limits.  Patient is noted to have discomfort plantar aspect heels but only upon deep palpation and overall they are moderately tender when palpated with no edema noted of the calcaneal bones themselves     Assessment:  Probability for fasciitis with possibility for bone injury secondary to jumping off a ladder     Plan:  H&P x-rays reviewed and at this point I recommended conservative oral anti-inflammatories ice therapy supportive shoes and heel lifts.  If symptoms do persist over the next few weeks we will consider cortisone injections or more aggressive treatment pattern  X-rays indicate that there is no signs of fracture of the calcaneus bilateral with no indication of spur formation signed visit

## 2019-06-09 ENCOUNTER — Telehealth: Payer: Self-pay | Admitting: *Deleted

## 2019-06-09 NOTE — Telephone Encounter (Signed)
Left message informing pt's wife, Billy Fernandez our doctors recommend the high number of the [employer], [employer], [place], and [place], they are a firmer shoe, with arch "cookie" for some support, but not custom support.

## 2019-06-09 NOTE — Telephone Encounter (Signed)
Pt's wife, Sharyn Lull states they are shoe shopping and would like to know our recommendations.

## 2019-06-17 DIAGNOSIS — L578 Other skin changes due to chronic exposure to nonionizing radiation: Secondary | ICD-10-CM | POA: Diagnosis not present

## 2019-06-17 DIAGNOSIS — L905 Scar conditions and fibrosis of skin: Secondary | ICD-10-CM | POA: Diagnosis not present

## 2019-06-17 DIAGNOSIS — D1801 Hemangioma of skin and subcutaneous tissue: Secondary | ICD-10-CM | POA: Diagnosis not present

## 2019-06-17 DIAGNOSIS — L570 Actinic keratosis: Secondary | ICD-10-CM | POA: Diagnosis not present

## 2019-06-17 DIAGNOSIS — L821 Other seborrheic keratosis: Secondary | ICD-10-CM | POA: Diagnosis not present

## 2019-07-28 ENCOUNTER — Telehealth: Payer: Self-pay

## 2019-07-28 NOTE — Telephone Encounter (Signed)
Pt will bring form by tomorrow to be looked over.

## 2019-07-28 NOTE — Telephone Encounter (Signed)
Copied from [place] [id number]. Topic: General - Other >> Jul 27, 2019  3:57 PM Celene Kras A wrote: Reason for CRM: Pt calling to get signature for a conceal and carry permit. Please advise.

## 2019-08-02 ENCOUNTER — Other Ambulatory Visit: Payer: Self-pay | Admitting: Podiatry

## 2019-09-17 ENCOUNTER — Encounter: Payer: Self-pay | Admitting: Internal Medicine

## 2019-09-21 ENCOUNTER — Other Ambulatory Visit: Payer: Self-pay

## 2019-09-21 DIAGNOSIS — R69 Illness, unspecified: Secondary | ICD-10-CM | POA: Diagnosis not present

## 2019-09-21 MED ORDER — COOL BLOOD GLUCOSE TEST STRIPS VI STRP: ORAL_STRIP | 1 refills | Status: DC

## 2019-09-22 DIAGNOSIS — R69 Illness, unspecified: Secondary | ICD-10-CM | POA: Diagnosis not present

## 2019-09-22 MED ORDER — FREESTYLE LANCETS MISC: 1 refills | Status: DC

## 2019-09-23 DIAGNOSIS — R69 Illness, unspecified: Secondary | ICD-10-CM | POA: Diagnosis not present

## 2019-10-05 ENCOUNTER — Other Ambulatory Visit: Payer: Self-pay | Admitting: Podiatry

## 2019-11-23 DIAGNOSIS — R69 Illness, unspecified: Secondary | ICD-10-CM | POA: Diagnosis not present

## 2019-12-18 DIAGNOSIS — R69 Illness, unspecified: Secondary | ICD-10-CM | POA: Diagnosis not present

## 2019-12-20 ENCOUNTER — Other Ambulatory Visit: Payer: Self-pay | Admitting: Internal Medicine

## 2019-12-20 ENCOUNTER — Other Ambulatory Visit: Payer: Self-pay | Admitting: Podiatry

## 2019-12-20 DIAGNOSIS — R69 Illness, unspecified: Secondary | ICD-10-CM | POA: Diagnosis not present

## 2019-12-22 DIAGNOSIS — R69 Illness, unspecified: Secondary | ICD-10-CM | POA: Diagnosis not present

## 2019-12-27 ENCOUNTER — Encounter: Payer: Self-pay | Admitting: Internal Medicine

## 2019-12-28 DIAGNOSIS — Z20828 Contact with and (suspected) exposure to other viral communicable diseases: Secondary | ICD-10-CM | POA: Diagnosis not present

## 2020-01-31 ENCOUNTER — Ambulatory Visit (INDEPENDENT_AMBULATORY_CARE_PROVIDER_SITE_OTHER): Payer: Medicare HMO

## 2020-01-31 ENCOUNTER — Other Ambulatory Visit: Payer: Self-pay

## 2020-01-31 ENCOUNTER — Ambulatory Visit: Payer: Medicare HMO | Admitting: Podiatry

## 2020-01-31 ENCOUNTER — Encounter: Payer: Self-pay | Admitting: Podiatry

## 2020-01-31 ENCOUNTER — Other Ambulatory Visit: Payer: Self-pay | Admitting: Podiatry

## 2020-01-31 VITALS — Temp 97.3°F

## 2020-01-31 DIAGNOSIS — M79674 Pain in right toe(s): Secondary | ICD-10-CM

## 2020-01-31 DIAGNOSIS — I7300 Raynaud's syndrome without gangrene: Secondary | ICD-10-CM

## 2020-01-31 DIAGNOSIS — M79675 Pain in left toe(s): Secondary | ICD-10-CM | POA: Diagnosis not present

## 2020-01-31 NOTE — Progress Notes (Signed)
Subjective:   Patient ID: Billy Fernandez, male   DOB: 69 y.o.   MRN: [id number]   HPI Patient is concerned because he has had change in his toes over the last month and they have discolored with 3 toes on the right 2 toes of the left being this way.  Patient does have diabetes that is under good control with last A1c 6.1 but is concerned about this condition and patient does not currently smoke and is active but is having pain in his toes   Review of Systems  All other systems reviewed and are negative.       Objective:  Physical Exam Vitals and nursing note reviewed.  Constitutional:      Appearance: He is well-developed.  Pulmonary:     Effort: Pulmonary effort is normal.  Musculoskeletal:        General: Normal range of motion.  Skin:    General: Skin is warm.  Neurological:     Mental Status: He is alert.     Neurovascular status was found to be intact muscle strength was found to be within normal limits with patient found to have discoloration of 3 digits on the right foot 2 digits on the left foot localized with mild breakdown of tissue not significant currently with no ulceration noted and just pain distally within the toes     Assessment:  Possibility for trauma versus possibility for Raynaud's phenomena low possible Covid type toes     Plan:  H&P reviewed conditions discussed x-rays reviewed.  At this point we are going to just monitor him and this should heal uneventfully and I have made him aware of protecting his toes and we discussed possible ways to vasodilating him at this time patient is discharged will be seen back as needed  X-rays indicate that there is no indications of bone pathology or structural changes from the injury sustained

## 2020-02-04 ENCOUNTER — Ambulatory Visit: Payer: Medicare HMO | Attending: Internal Medicine

## 2020-02-04 DIAGNOSIS — Z23 Encounter for immunization: Secondary | ICD-10-CM | POA: Insufficient documentation

## 2020-02-04 NOTE — Progress Notes (Signed)
   Covid-19 Vaccination Clinic  Name:  Billy Fernandez    MRN: [id number] DOB: [birth year]  02/04/2020  [name] was observed post Covid-19 immunization for 15 minutes without incidence. He was provided with Vaccine Information Sheet and instruction to access the V-Safe system.   [name] was instructed to call 911 with any severe reactions post vaccine: Marland Kitchen Difficulty breathing  . Swelling of your face and throat  . A fast heartbeat  . A bad rash all over your body  . Dizziness and weakness    Immunizations Administered    Name Date Dose VIS Date Route   Pfizer COVID-19 Vaccine 02/04/2020 12:42 PM 0.3 mL 12/03/2019 Intramuscular   Manufacturer: [place]   Lot: [id number]   [place]: [id number]

## 2020-02-22 DIAGNOSIS — H524 Presbyopia: Secondary | ICD-10-CM | POA: Diagnosis not present

## 2020-02-22 DIAGNOSIS — E119 Type 2 diabetes mellitus without complications: Secondary | ICD-10-CM | POA: Diagnosis not present

## 2020-02-22 LAB — HM DIABETES EYE EXAM

## 2020-02-23 ENCOUNTER — Other Ambulatory Visit: Payer: Self-pay | Admitting: Internal Medicine

## 2020-02-27 ENCOUNTER — Ambulatory Visit: Payer: Medicare HMO | Attending: Internal Medicine

## 2020-02-27 DIAGNOSIS — Z23 Encounter for immunization: Secondary | ICD-10-CM | POA: Insufficient documentation

## 2020-02-27 NOTE — Progress Notes (Signed)
   Covid-19 Vaccination Clinic  Name:  Billy Fernandez    MRN: [id number] DOB: [birth year]  02/27/2020  Billy Fernandez was observed post Covid-19 immunization for 15 minutes without incident. He was provided with Vaccine Information Sheet and instruction to access the V-Safe system.   Billy Fernandez was instructed to call 911 with any severe reactions post vaccine: Marland Kitchen Difficulty breathing  . Swelling of face and throat  . A fast heartbeat  . A bad rash all over body  . Dizziness and weakness   Immunizations Administered    Name Date Dose VIS Date Route   Pfizer COVID-19 Vaccine 02/27/2020 12:30 PM 0.3 mL 12/03/2019 Intramuscular   Manufacturer: [place]   Lot: [id number]   [place]: [id number]

## 2020-03-02 ENCOUNTER — Encounter: Payer: Self-pay | Admitting: Internal Medicine

## 2020-03-06 DIAGNOSIS — R69 Illness, unspecified: Secondary | ICD-10-CM | POA: Diagnosis not present

## 2020-03-07 ENCOUNTER — Other Ambulatory Visit: Payer: Self-pay | Admitting: Internal Medicine

## 2020-03-07 DIAGNOSIS — R69 Illness, unspecified: Secondary | ICD-10-CM | POA: Diagnosis not present

## 2020-03-19 ENCOUNTER — Other Ambulatory Visit: Payer: Self-pay | Admitting: Internal Medicine

## 2020-04-05 ENCOUNTER — Encounter: Payer: Self-pay | Admitting: Internal Medicine

## 2020-04-13 NOTE — Patient Instructions (Addendum)
Blood work was ordered.  An EKG was done today.   All other Health Maintenance issues reviewed.   All recommended immunizations and age-appropriate screenings are up-to-date or discussed.  No immunization administered today.   Medications reviewed and updated.  Changes include :   none  Your prescription(s) have been submitted to your pharmacy. Please take as directed and contact our office if you believe you are having problem(s) with the medication(s).    Please followup in 1 year    Health Maintenance, Male Adopting a healthy lifestyle and getting preventive care are important in promoting health and wellness. Ask your health care provider about:  The right schedule for you to have regular tests and exams.  Things you can do on your own to prevent diseases and keep yourself healthy. What should I know about diet, weight, and exercise? Eat a healthy diet   Eat a diet that includes plenty of vegetables, fruits, low-fat dairy products, and lean protein.  Do not eat a lot of foods that are high in solid fats, added sugars, or sodium. Maintain a healthy weight Body mass index (BMI) is a measurement that can be used to identify possible weight problems. It estimates body fat based on height and weight. Your health care provider can help determine your BMI and help you achieve or maintain a healthy weight. Get regular exercise Get regular exercise. This is one of the most important things you can do for your health. Most adults should:  Exercise for at least 150 minutes each week. The exercise should increase your heart rate and make you sweat (moderate-intensity exercise).  Do strengthening exercises at least twice a week. This is in addition to the moderate-intensity exercise.  Spend less time sitting. Even light physical activity can be beneficial. Watch cholesterol and blood lipids Have your blood tested for lipids and cholesterol at 68 years of age, then have this test  every 5 years. You may need to have your cholesterol levels checked more often if:  Your lipid or cholesterol levels are high.  You are older than 68 years of age.  You are at high risk for heart disease. What should I know about cancer screening? Many types of cancers can be detected early and may often be prevented. Depending on your health history and family history, you may need to have cancer screening at various ages. This may include screening for:  Colorectal cancer.  Prostate cancer.  Skin cancer.  Lung cancer. What should I know about heart disease, diabetes, and high blood pressure? Blood pressure and heart disease  High blood pressure causes heart disease and increases the risk of stroke. This is more likely to develop in people who have high blood pressure readings, are of African descent, or are overweight.  Talk with your health care provider about your target blood pressure readings.  Have your blood pressure checked: ? Every 3-5 years if you are 38-19 years of age. ? Every year if you are 83 years old or older.  If you are between the ages of 28 and 70 and are a current or former smoker, ask your health care provider if you should have a one-time screening for abdominal aortic aneurysm (AAA). Diabetes Have regular diabetes screenings. This checks your fasting blood sugar level. Have the screening done:  Once every three years after age 22 if you are at a normal weight and have a low risk for diabetes.  More often and at a younger age if you  are overweight or have a high risk for diabetes. What should I know about preventing infection? Hepatitis B If you have a higher risk for hepatitis B, you should be screened for this virus. Talk with your health care provider to find out if you are at risk for hepatitis B infection. Hepatitis C Blood testing is recommended for:  Everyone born from [birth year] through 1964.  Anyone with known risk factors for hepatitis  C. Sexually transmitted infections (STIs)  You should be screened each year for STIs, including gonorrhea and chlamydia, if: ? You are sexually active and are younger than 68 years of age. ? You are older than 68 years of age and your health care provider tells you that you are at risk for this type of infection. ? Your sexual activity has changed since you were last screened, and you are at increased risk for chlamydia or gonorrhea. Ask your health care provider if you are at risk.  Ask your health care provider about whether you are at high risk for HIV. Your health care provider may recommend a prescription medicine to help prevent HIV infection. If you choose to take medicine to prevent HIV, you should first get tested for HIV. You should then be tested every 3 months for as long as you are taking the medicine. Follow these instructions at home: Lifestyle  Do not use any products that contain nicotine or tobacco, such as cigarettes, e-cigarettes, and chewing tobacco. If you need help quitting, ask your health care provider.  Do not use street drugs.  Do not share needles.  Ask your health care provider for help if you need support or information about quitting drugs. Alcohol use  Do not drink alcohol if your health care provider tells you not to drink.  If you drink alcohol: ? Limit how much you have to 0-2 drinks a day. ? Be aware of how much alcohol is in your drink. In the [place] one drink equals one 12 oz bottle of beer (355 mL), one 5 oz glass of wine (148 mL), or one 1 oz glass of hard liquor (44 mL). General instructions  Schedule regular health, dental, and eye exams.  Stay current with your vaccines.  Tell your health care provider if: ? You often feel depressed. ? You have ever been abused or do not feel safe at home. Summary  Adopting a healthy lifestyle and getting preventive care are important in promoting health and wellness.  Follow your health care  provider's instructions about healthy diet, exercising, and getting tested or screened for diseases.  Follow your health care provider's instructions on monitoring your cholesterol and blood pressure. This information is not intended to replace advice given to you by your health care provider. Make sure you discuss any questions you have with your health care provider. Document Revised: 12/02/2018 Document Reviewed: 12/02/2018 Elsevier Patient Education  2020 [employer].

## 2020-04-13 NOTE — Progress Notes (Signed)
Subjective:    Patient ID: Billy Fernandez, male    DOB: [birth year], 69 y.o.   MRN: [id number]  HPI He is here for a physical exam.   His sugars at home are very good 90's- 106.    Sore spot in neck - can hardly turn to the left.  Numbness in the left thumb and can feel it when his neck pain flares.  He can feel it at times in the upper arm at times.  No arm weakness.  He changed his pillow and that has helped.  He does do some neck exercises and it helps.   Medications and allergies reviewed with patient and updated if appropriate.  Patient Active Problem List   Diagnosis Date Noted  . Insomnia 12/31/2018  . Diabetes mellitus type 2 in nonobese ([place]) 07/01/2014  . Eczema 06/29/2013  . Hyperlipidemia 06/29/2013  . Low libido 05/28/2013  . Essential hypertension, benign 05/28/2013    Current Outpatient Medications on File Prior to Visit  Medication Sig Dispense Refill  . aspirin 81 MG tablet Take 81 mg by mouth daily.    Marland Kitchen atorvastatin (LIPITOR) 20 MG tablet Take 1 tablet (20 mg total) by mouth daily. Need office visit for more refills. 30 tablet 0  . carvedilol (COREG) 6.25 MG tablet Take 1 tablet (6.25 mg total) by mouth 2 (two) times daily. Need office visit for more refills. 60 tablet 0  . fish oil-omega-3 fatty acids 1000 MG capsule Take 2 g by mouth daily.    . Lancets (FREESTYLE) lancets Use to check blood sugars. E11.9 100 each 1  . losartan-hydrochlorothiazide (HYZAAR) 100-12.5 MG tablet Take 1 tablet by mouth daily. Need office visit for more refills. 30 tablet 0  . meloxicam (MOBIC) 15 MG tablet TAKE ONE TABLET BY MOUTH ONE TIME DAILY  30 tablet 0  . metFORMIN (GLUCOPHAGE) 1000 MG tablet Take 1 tablet (1,000 mg total) by mouth 2 (two) times daily with a meal. Need office visit for more refills. 60 tablet 0  . ONETOUCH VERIO test strip use to check blood sugar once daily 100 each 0  . zinc gluconate 50 MG tablet Take 50 mg by mouth daily.    Marland Kitchen zolpidem (AMBIEN) 10 MG  tablet Take 1 tablet (10 mg total) by mouth at bedtime as needed for sleep. 30 tablet 1   No current facility-administered medications on file prior to visit.    Past Medical History:  Diagnosis Date  . Hypertension     History reviewed. No pertinent surgical history.  Social History   Socioeconomic History  . Marital status: Married    Spouse name: Not on file  . Number of children: Not on file  . Years of education: Not on file  . Highest education level: Not on file  Occupational History  . Not on file  Tobacco Use  . Smoking status: Former Research scientist (life sciences)  . Smokeless tobacco: Never Used  Substance and Sexual Activity  . Alcohol use: Yes    Alcohol/week: 1.0 standard drinks    Types: 1 Cans of beer per week  . Drug use: No  . Sexual activity: Yes  Other Topics Concern  . Not on file  Social History Narrative  . Not on file   Social Determinants of Health   Financial Resource Strain:   . Difficulty of Paying Living Expenses:   Food Insecurity:   . Worried About Charity fundraiser in the Last Year:   . [employer]  of Food in the Last Year:   Transportation Needs:   . Film/video editor (Medical):   Marland Kitchen Lack of Transportation (Non-Medical):   Physical Activity:   . Days of Exercise per Week:   . Minutes of Exercise per Session:   Stress:   . Feeling of Stress :   Social Connections:   . Frequency of Communication with Friends and Family:   . Frequency of Social Gatherings with Friends and Family:   . Attends Religious Services:   . Active Member of Clubs or Organizations:   . Attends Archivist Meetings:   Marland Kitchen Marital Status:     Family History  Problem Relation Age of Onset  . Breast cancer Mother   . Cancer Mother   . Diabetes Mother   . Heart disease Father   . Hyperlipidemia Father   . Hypertension Father   . Diabetes Brother   . Heart disease Brother        cabg  . Alcohol abuse Neg Hx   . COPD Neg Hx   . Depression Neg Hx   . Drug abuse  Neg Hx   . Early death Neg Hx   . Hearing loss Neg Hx   . Kidney disease Neg Hx   . Stroke Neg Hx     Review of Systems  Constitutional: Negative for chills and fever.  Eyes: Negative for visual disturbance.  Respiratory: Negative for cough, shortness of breath and wheezing.   Cardiovascular: Positive for palpitations (at rest, chronic, intermittent, transient). Negative for chest pain and leg swelling.  Gastrointestinal: Negative for abdominal pain, blood in stool, constipation, diarrhea and nausea.       No gerd  Genitourinary: Negative for difficulty urinating, dysuria and hematuria.  Musculoskeletal: Positive for arthralgias (hands) and neck pain.  Skin: Negative for color change and rash.  Neurological: Positive for numbness (left numb). Negative for dizziness, light-headedness and headaches.  Psychiatric/Behavioral: Negative for dysphoric mood. The patient is not nervous/anxious.        Objective:   Vitals:   04/14/20 0750  BP: 138/82  Pulse: (!) 55  Resp: 16  Temp: 97.8 F (36.6 C)  SpO2: 99%   Filed Weights   04/14/20 0750  Weight: 191 lb (86.6 kg)   Body mass index is 27.41 kg/m.  BP Readings from Last 3 Encounters:  04/14/20 138/82  12/25/18 122/80  12/19/17 132/86    Wt Readings from Last 3 Encounters:  04/14/20 191 lb (86.6 kg)  12/25/18 192 lb 6.4 oz (87.3 kg)  12/19/17 203 lb (92.1 kg)     Physical Exam Constitutional: He appears well-developed and well-nourished. No distress.  HENT:  Head: Normocephalic and atraumatic.  Right Ear: External ear normal.  Left Ear: External ear normal.  Mouth/Throat: Oropharynx is clear and moist.  Normal ear canals and TM b/l  Eyes: Conjunctivae and EOM are normal.  Neck: Neck supple. No tracheal deviation present. No thyromegaly present. No carotid bruit  Cardiovascular: Normal rate, regular rhythm, normal heart sounds and intact distal pulses.   No murmur heard. Pulmonary/Chest: Effort normal and breath  sounds normal. No respiratory distress. He has no wheezes. He has no rales.  Abdominal: Soft. He exhibits no distension. There is no tenderness.  Genitourinary: deferred  Musculoskeletal: He exhibits no edema.  Lymphadenopathy:   He has no cervical adenopathy.  Skin: Skin is warm and dry. He is not diaphoretic.  Psychiatric: He has a normal mood and affect. His behavior is normal.  Diabetic Foot Exam - Simple   Simple Foot Form Diabetic Foot exam was performed with the following findings: Yes 04/14/2020  8:34 AM  Visual Inspection No deformities, no ulcerations, no other skin breakdown bilaterally: Yes Sensation Testing Intact to touch and monofilament testing bilaterally: Yes Pulse Check Posterior Tibialis and Dorsalis pulse intact bilaterally: Yes Comments          Assessment & Plan:   Physical exam: Screening blood work  ordered Immunizations  Had covid vaccine, deferred pneumonia vaccines and shingrix Colonoscopy   Up to date  Eye exams   Up to date  Exercise   Walking, jumps rope Weight  Ok for age Substance abuse     Sees derm annually  See Problem List for Assessment and Plan of chronic medical problems.   This visit occurred during the SARS-CoV-2 public health emergency.  Safety protocols were in place, including screening questions prior to the visit, additional usage of staff PPE, and extensive cleaning of exam room while observing appropriate contact time as indicated for disinfecting solutions.

## 2020-04-14 ENCOUNTER — Encounter: Payer: Self-pay | Admitting: Internal Medicine

## 2020-04-14 ENCOUNTER — Ambulatory Visit (INDEPENDENT_AMBULATORY_CARE_PROVIDER_SITE_OTHER): Payer: Medicare HMO | Admitting: Internal Medicine

## 2020-04-14 ENCOUNTER — Other Ambulatory Visit: Payer: Self-pay

## 2020-04-14 VITALS — BP 138/82 | HR 55 | Temp 97.8°F | Resp 16 | Ht 70.0 in | Wt 191.0 lb

## 2020-04-14 DIAGNOSIS — Z125 Encounter for screening for malignant neoplasm of prostate: Secondary | ICD-10-CM

## 2020-04-14 DIAGNOSIS — E119 Type 2 diabetes mellitus without complications: Secondary | ICD-10-CM

## 2020-04-14 DIAGNOSIS — I10 Essential (primary) hypertension: Secondary | ICD-10-CM | POA: Diagnosis not present

## 2020-04-14 DIAGNOSIS — M5412 Radiculopathy, cervical region: Secondary | ICD-10-CM | POA: Insufficient documentation

## 2020-04-14 DIAGNOSIS — Z0000 Encounter for general adult medical examination without abnormal findings: Secondary | ICD-10-CM | POA: Diagnosis not present

## 2020-04-14 DIAGNOSIS — E7849 Other hyperlipidemia: Secondary | ICD-10-CM | POA: Diagnosis not present

## 2020-04-14 HISTORY — DX: Radiculopathy, cervical region: M54.12

## 2020-04-14 LAB — COMPREHENSIVE METABOLIC PANEL
ALT: 19 U/L (ref 0–53)
AST: 19 U/L (ref 0–37)
Albumin: 4.4 g/dL (ref 3.5–5.2)
Alkaline Phosphatase: 37 U/L — ABNORMAL LOW (ref 39–117)
BUN: 22 mg/dL (ref 6–23)
CO2: 30 mEq/L (ref 19–32)
Calcium: 9.6 mg/dL (ref 8.4–10.5)
Chloride: 104 mEq/L (ref 96–112)
Creatinine, Ser: 1.01 mg/dL (ref 0.40–1.50)
GFR: 73.41 mL/min (ref >60.00)
Glucose, Bld: 90 mg/dL (ref 70–99)
Potassium: 4.4 mEq/L (ref 3.5–5.1)
Sodium: 141 mEq/L (ref 135–145)
Total Bilirubin: 0.9 mg/dL (ref 0.2–1.2)
Total Protein: 6.9 g/dL (ref 6.0–8.3)

## 2020-04-14 LAB — CBC WITH DIFFERENTIAL/PLATELET
Basophils Absolute: 0 10*3/uL (ref 0.0–0.1)
Basophils Relative: 0.6 % (ref 0.0–3.0)
Eosinophils Absolute: 0.2 10*3/uL (ref 0.0–0.7)
Eosinophils Relative: 3.4 % (ref 0.0–5.0)
HCT: 46.5 % (ref 39.0–52.0)
Hemoglobin: 15.8 g/dL (ref 13.0–17.0)
Lymphocytes Relative: 24.3 % (ref 12.0–46.0)
Lymphs Abs: 1.6 10*3/uL (ref 0.7–4.0)
MCHC: 33.9 g/dL (ref 30.0–36.0)
MCV: 90.3 fl (ref 78.0–100.0)
Monocytes Absolute: 0.7 10*3/uL (ref 0.1–1.0)
Monocytes Relative: 9.7 % (ref 3.0–12.0)
Neutro Abs: 4.2 10*3/uL (ref 1.4–7.7)
Neutrophils Relative %: 62 % (ref 43.0–77.0)
Platelets: 214 10*3/uL (ref 150.0–400.0)
RBC: 5.15 Mil/uL (ref 4.22–5.81)
RDW: 14.1 % (ref 11.5–15.5)
WBC: 6.7 10*3/uL (ref 4.0–10.5)

## 2020-04-14 LAB — TSH: TSH: 0.83 u[IU]/mL (ref 0.35–4.50)

## 2020-04-14 LAB — LIPID PANEL
Cholesterol: 139 mg/dL (ref 0–200)
HDL: 51.2 mg/dL (ref >39.00)
LDL Cholesterol: 70 mg/dL (ref 0–99)
NonHDL: 87.78
Total CHOL/HDL Ratio: 3
Triglycerides: 88 mg/dL (ref 0.0–149.0)
VLDL: 17.6 mg/dL (ref 0.0–40.0)

## 2020-04-14 LAB — PSA, MEDICARE: PSA: 0.99 ng/ml (ref 0.10–4.00)

## 2020-04-14 LAB — HEMOGLOBIN A1C: Hgb A1c MFr Bld: 6 % (ref 4.6–6.5)

## 2020-04-14 NOTE — Assessment & Plan Note (Signed)
Chronic Check lipid panel  Continue statin Regular exercise and healthy diet encouraged

## 2020-04-14 NOTE — Assessment & Plan Note (Addendum)
Chronic BP well controlled Current regimen effective and well tolerated Continue current medications at current doses cmp EKG today: Sinus bradycardia at 51 bpm, normal EKG.  Heart rate slightly lower than previous EKG from 2017, but otherwise unchanged

## 2020-04-14 NOTE — Assessment & Plan Note (Signed)
Acute He is experiencing left-sided neck pain intermittently and associated numbness in the left thumb and upper arm intermittently No weakness or muscle atrophy Likely pinched nerve at C6 Changing his pillow and doing some exercises has helped Advised to continue the exercises, can take Advil or Aleve on occasion if needed, can apply ice or heat to the neck If symptoms worsen and he is interested in a referral to sports medicine/orthopedics or doing physical therapy he will let me know

## 2020-04-14 NOTE — Assessment & Plan Note (Signed)
Chronic Taking metformin Sugars well controlled at home Check a1c Low sugar / carb diet Continue regular exercise

## 2020-04-16 MED ORDER — METFORMIN HCL 1000 MG PO TABS: 500.0000 mg | ORAL_TABLET | Freq: Two times a day (BID) | ORAL | 3 refills | Status: DC

## 2020-04-17 ENCOUNTER — Encounter: Payer: Self-pay | Admitting: Internal Medicine

## 2020-05-17 ENCOUNTER — Telehealth: Payer: Self-pay | Admitting: Internal Medicine

## 2020-05-17 MED ORDER — CARVEDILOL 6.25 MG PO TABS: 6.2500 mg | ORAL_TABLET | Freq: Two times a day (BID) | ORAL | 3 refills | Status: DC

## 2020-05-17 MED ORDER — METFORMIN HCL 1000 MG PO TABS: 500.0000 mg | ORAL_TABLET | Freq: Two times a day (BID) | ORAL | 3 refills | Status: DC

## 2020-05-17 MED ORDER — LOSARTAN POTASSIUM-HCTZ 100-12.5 MG PO TABS: 1.0000 | ORAL_TABLET | Freq: Every day | ORAL | 3 refills | Status: DC

## 2020-05-17 MED ORDER — ATORVASTATIN CALCIUM 20 MG PO TABS: 20.0000 mg | ORAL_TABLET | Freq: Every day | ORAL | 3 refills | Status: DC

## 2020-05-17 NOTE — Telephone Encounter (Signed)
Rx sent 

## 2020-05-17 NOTE — Telephone Encounter (Signed)
New message:   1.Medication Requested: atorvastatin (LIPITOR) 20 MG tablet losartan-hydrochlorothiazide (HYZAAR) 100-12.5 MG tablet carvedilol (COREG) 6.25 MG tablet metFORMIN (GLUCOPHAGE) 1000 MG tablet 2. Pharmacy (Name, [place], [place]): [facility] [place] [place] 3. On Med List: Yes  4. Last Visit with PCP: 04/14/20  5. Next visit date with PCP: None  Pt wife states they would like to have a 90 day supply. She states they don't live close to a pharmacy so need 90 days. Please advise.  Agent: Please be advised that RX refills may take up to 3 business days. We ask that you follow-up with your pharmacy.

## 2020-05-24 DIAGNOSIS — R69 Illness, unspecified: Secondary | ICD-10-CM | POA: Diagnosis not present

## 2020-06-22 ENCOUNTER — Telehealth: Payer: Self-pay | Admitting: Podiatry

## 2020-06-22 ENCOUNTER — Other Ambulatory Visit: Payer: Self-pay | Admitting: Internal Medicine

## 2020-06-22 DIAGNOSIS — L578 Other skin changes due to chronic exposure to nonionizing radiation: Secondary | ICD-10-CM | POA: Diagnosis not present

## 2020-06-22 DIAGNOSIS — L905 Scar conditions and fibrosis of skin: Secondary | ICD-10-CM | POA: Diagnosis not present

## 2020-06-22 DIAGNOSIS — R413 Other amnesia: Secondary | ICD-10-CM

## 2020-06-22 DIAGNOSIS — D2271 Melanocytic nevi of right lower limb, including hip: Secondary | ICD-10-CM | POA: Diagnosis not present

## 2020-06-22 DIAGNOSIS — L821 Other seborrheic keratosis: Secondary | ICD-10-CM | POA: Diagnosis not present

## 2020-06-22 DIAGNOSIS — L814 Other melanin hyperpigmentation: Secondary | ICD-10-CM | POA: Diagnosis not present

## 2020-06-22 DIAGNOSIS — E119 Type 2 diabetes mellitus without complications: Secondary | ICD-10-CM

## 2020-06-22 DIAGNOSIS — L570 Actinic keratosis: Secondary | ICD-10-CM | POA: Diagnosis not present

## 2020-06-22 NOTE — Telephone Encounter (Signed)
PT sent a contact form via our website stating: "The doctor told me a brand of thermal socks to buy. I forgot his recommendation. Please advise"

## 2020-06-29 NOTE — Telephone Encounter (Signed)
Go to [facility] and talk to them about best sock thermal

## 2020-06-30 ENCOUNTER — Telehealth: Payer: Self-pay | Admitting: *Deleted

## 2020-06-30 NOTE — Telephone Encounter (Signed)
Returned patients phone call regarding what type of socks to buy.  I talked to Dr. Paulla Dolly and he suggested the following:  Go to [facility] and talk to them about the best sock thermal to get  Patient stated they understood

## 2020-07-22 ENCOUNTER — Other Ambulatory Visit: Payer: Self-pay | Admitting: Podiatry

## 2020-07-22 ENCOUNTER — Encounter: Payer: Self-pay | Admitting: Internal Medicine

## 2020-07-23 MED ORDER — LOSARTAN POTASSIUM-HCTZ 100-12.5 MG PO TABS: 1.0000 | ORAL_TABLET | Freq: Every day | ORAL | 1 refills | Status: DC

## 2020-07-23 MED ORDER — ATORVASTATIN CALCIUM 20 MG PO TABS: 20.0000 mg | ORAL_TABLET | Freq: Every day | ORAL | 1 refills | Status: DC

## 2020-07-23 MED ORDER — METFORMIN HCL 1000 MG PO TABS: 500.0000 mg | ORAL_TABLET | Freq: Two times a day (BID) | ORAL | 1 refills | Status: DC

## 2020-07-23 MED ORDER — CARVEDILOL 6.25 MG PO TABS: 6.2500 mg | ORAL_TABLET | Freq: Two times a day (BID) | ORAL | 1 refills | Status: DC

## 2020-08-04 MED ORDER — LOSARTAN POTASSIUM-HCTZ 100-12.5 MG PO TABS: 1.0000 | ORAL_TABLET | Freq: Every day | ORAL | 0 refills | Status: DC

## 2020-08-04 MED ORDER — CARVEDILOL 6.25 MG PO TABS: 6.2500 mg | ORAL_TABLET | Freq: Two times a day (BID) | ORAL | 0 refills | Status: DC

## 2020-08-04 MED ORDER — ATORVASTATIN CALCIUM 20 MG PO TABS: 20.0000 mg | ORAL_TABLET | Freq: Every day | ORAL | 0 refills | Status: DC

## 2020-08-04 MED ORDER — METFORMIN HCL 1000 MG PO TABS: 500.0000 mg | ORAL_TABLET | Freq: Two times a day (BID) | ORAL | 0 refills | Status: DC

## 2020-08-04 NOTE — Telephone Encounter (Signed)
   Spouse calling , states medication was never dispensed at [facility], Patient is out of town with no medication Patient would like refill sent to : [id number] [place], [place] - [address] AT [place] [address]

## 2020-08-04 NOTE — Addendum Note (Signed)
Addended by: Earnstine Regal on: 08/04/2020 03:16 PM   Modules accepted: Orders

## 2020-08-15 ENCOUNTER — Encounter: Payer: Medicare HMO | Admitting: Psychology

## 2020-08-19 DIAGNOSIS — Z20822 Contact with and (suspected) exposure to covid-19: Secondary | ICD-10-CM | POA: Diagnosis not present

## 2020-08-24 ENCOUNTER — Encounter: Payer: Self-pay | Admitting: Psychology

## 2020-09-20 ENCOUNTER — Encounter: Payer: Self-pay | Admitting: Psychology

## 2020-09-20 ENCOUNTER — Other Ambulatory Visit: Payer: Self-pay

## 2020-09-20 ENCOUNTER — Encounter: Payer: Self-pay | Admitting: Internal Medicine

## 2020-09-20 ENCOUNTER — Ambulatory Visit (INDEPENDENT_AMBULATORY_CARE_PROVIDER_SITE_OTHER): Payer: Medicare HMO | Admitting: Psychology

## 2020-09-20 ENCOUNTER — Ambulatory Visit: Payer: Medicare HMO

## 2020-09-20 DIAGNOSIS — R4189 Other symptoms and signs involving cognitive functions and awareness: Secondary | ICD-10-CM

## 2020-09-20 DIAGNOSIS — R413 Other amnesia: Secondary | ICD-10-CM | POA: Diagnosis not present

## 2020-09-20 DIAGNOSIS — E119 Type 2 diabetes mellitus without complications: Secondary | ICD-10-CM

## 2020-09-20 DIAGNOSIS — I10 Essential (primary) hypertension: Secondary | ICD-10-CM

## 2020-09-20 NOTE — Progress Notes (Signed)
NEUROPSYCHOLOGICAL EVALUATION [place] Department of Neurology  Date of Evaluation: September 20, 2020  Reason for Referral:   Billy Fernandez is a 69 y.o. right-handed Caucasian male referred by [name], M.D., to characterize his current cognitive functioning and assist with diagnostic clarity and treatment planning in the context of subjective cognitive decline.   Assessment and Plan:   Clinical Impression(s): Billy Fernandez's pattern of performance is suggestive of neuropsychological functioning within normal limits. Relative weaknesses (i.e., below average normative range) were exhibited across a digit repetition task assessing basic attention and a phonemic fluency task. He struggled across a computerized task assessing pattern recognition and adaptability; however, all other assessments of executive functioning were strong. Very mild performance variability was exhibited across memory measures; however, scores remained within expectation. Performance was additionally appropriate across processing speed, complex attention, receptive language, semantic fluency, confrontation naming, and visuospatial abilities. Billy Fernandez denied difficulties completing instrumental activities of daily living (ADLs) independently. Significant psychiatric distress was also denied.   Factors which can create and maintain cognitive inefficiencies can include various psychosocial stressors and periods of disrupted sleep. Billy Fernandez does have a history of type 2 diabetes and other cardiovascular ailments. While there was no neuroimaging available to examine the extent or lack of small vessel ischemic changes, it is possible that these conditions could influence cognitive performance to a mild degree. Specific to memory, Billy Fernandez did exhibit a relative weakness learning a lengthy story relative to a learning a list of words or various shapes. Despite this, he was  able to learn novel verbal and visual information efficiently and retained this knowledge after lengthy delays. Overall, memory performance combined with intact performances across other areas of cognitive functioning is not suggestive of Alzheimer's disease at the present time. Likewise, his cognitive and behavioral profile is not suggestive of any other form of neurodegenerative illness presently.  Recommendations: Should Billy Fernandez report concerns regarding cognitive and/or functional decline in the future, a repeat evaluation would be warranted at that time. The current evaluation will serve as an excellent baseline to compare future evaluations against.   [name] is encouraged to attend to lifestyle factors for brain health (e.g., regular physical exercise, good nutrition habits, regular participation in cognitively-stimulating activities, and general stress management techniques), which are likely to have benefits for both emotional adjustment and cognition. In fact, in addition to promoting good general health, regular exercise incorporating aerobic activities (e.g., brisk walking, jogging, cycling, etc.) has been demonstrated to be a very effective treatment for depression and stress, with similar efficacy rates to both antidepressant medication and psychotherapy. Optimal control of vascular risk factors (including safe cardiovascular exercise and adherence to dietary recommendations) is encouraged.   If interested, there are some activities which have therapeutic value and can be useful in keeping him cognitively stimulated. For suggestions, Billy Fernandez is encouraged to go to the following website: https://www.[facility].org/get-to-know-barrow/centers-programs/neurorehabilitation-center/neuro-rehab-apps-and-games/ which has options, categorized by level of difficulty. It should be noted that these activities should not be viewed as a substitute for therapy.  When learning new  information, he would benefit from information being broken up into small, manageable pieces. He may also find it helpful to articulate the material in his own words and in a context to promote encoding at the onset of a new task. This material may need to be repeated multiple times to promote encoding.  Memory can be improved using internal strategies such as rehearsal, repetition, chunking, mnemonics, association, and imagery. External  strategies such as written notes in a consistently used memory journal, visual and nonverbal auditory cues such as a calendar on the refrigerator or appointments with alarm, such as on a cell phone, can also help maximize recall.    To address problems with fluctuating attention, he may wish to consider:   -Avoiding external distractions when needing to concentrate   -Limiting exposure to fast paced environments with multiple sensory demands   -Writing down complicated information and using checklists   -Attempting and completing one task at a time (i.e., no multi-tasking)   -Verbalizing aloud each step of a task to maintain focus   -Reducing the amount of information considered at one time  Review of Records:   Billy Fernandez was seen by [facility] [name], M.D.) on 04/14/2020 for follow-up of several conditions including type 2 diabetes, hypertension, and hyperlipidemia. At that time, he additionally reported a sore spot in his neck preventing him from turning to the left. Memory concerns were noted via an order for cognitive testing on 06/22/2020. However, no further details were available to me upon my review. Ultimately, Billy Fernandez was referred for a comprehensive neuropsychological evaluation to characterize his cognitive abilities and to assist with diagnostic clarity and treatment planning.   No neuroimaging was available for review.   Past Medical History:  Diagnosis Date  . Cervical radiculopathy at C6 04/14/2020  . Essential hypertension,  benign 05/28/2013  . Hyperlipidemia 06/29/2013  . Type 2 diabetes mellitus 07/01/2014    No past surgical history on file.   Current Outpatient Medications:  .  aspirin 81 MG tablet, Take 81 mg by mouth daily., Disp: , Rfl:  .  atorvastatin (LIPITOR) 20 MG tablet, Take 1 tablet (20 mg total) by mouth daily., Disp: 90 tablet, Rfl: 0 .  carvedilol (COREG) 6.25 MG tablet, Take 1 tablet (6.25 mg total) by mouth 2 (two) times daily., Disp: 180 tablet, Rfl: 0 .  fish oil-omega-3 fatty acids 1000 MG capsule, Take 2 g by mouth daily., Disp: , Rfl:  .  Lancets (FREESTYLE) lancets, Use to check blood sugars. E11.9, Disp: 100 each, Rfl: 1 .  losartan-hydrochlorothiazide (HYZAAR) 100-12.5 MG tablet, Take 1 tablet by mouth daily., Disp: 90 tablet, Rfl: 0 .  meloxicam (MOBIC) 15 MG tablet, TAKE ONE TABLET BY MOUTH ONE TIME DAILY, Disp: 30 tablet, Rfl: 0 .  metFORMIN (GLUCOPHAGE) 1000 MG tablet, Take 0.5 tablets (500 mg total) by mouth 2 (two) times daily with a meal., Disp: 90 tablet, Rfl: 0 .  ONETOUCH VERIO test strip, use to check blood sugar once daily, Disp: 100 each, Rfl: 0 .  zinc gluconate 50 MG tablet, Take 50 mg by mouth daily., Disp: , Rfl:  .  zolpidem (AMBIEN) 10 MG tablet, Take 1 tablet (10 mg total) by mouth at bedtime as needed for sleep., Disp: 30 tablet, Rfl: 1  Clinical Interview:   The following information was obtained during a clinical interview with Mr. Vanatta prior to cognitive testing.  Cognitive Symptoms: Decreased short-term memory: Endorsed. He acknowledged occasional instances where he will walk into a room and forget his original intention. He also described some instances where he will forget to pick up important objects (e.g., forgetting his keys or wallet when leaving the house). He acknowledged longstanding and unchanged difficulties recalling the names of individuals; he commented that he is always able to remember faces. Overall, difficulties were said to occur  occasionally and were attributed to him having a lot on his mind.  He acknowledged that his wife may have greater concerns than he does and that she was influential in setting up the current evaluation.  Decreased long-term memory: Denied. Decreased attention/concentration: Denied. Reduced processing speed: Endorsed "sometimes."  Difficulties with executive functions: Denied. Overt personality changes were also denied.  Difficulties with emotion regulation: Denied. Difficulties with receptive language: Denied. Difficulties with word finding: Denied. Decreased visuoperceptual ability: Denied.  Difficulties completing ADLs: Denied.  Additional Medical History: History of traumatic brain injury/concussion: Denied. History of stroke: Denied. History of seizure activity: Denied. History of known exposure to toxins: Denied. Symptoms of chronic pain: Denied outside of occasionally stiff finger joints.  Experience of frequent headaches/migraines: Denied. Frequent instances of dizziness/vertigo: Denied.  Sensory changes: He wears glasses with positive effect. No other sensory changes/difficulties (e.g., hearing, taste, or smell) were reported.  Balance/coordination difficulties: Denied. He likewise denied a history of falls.  Other motor difficulties: Denied.  Sleep History: Estimated hours obtained each night: 6 hours at minimum.  Difficulties falling asleep: Denied. Difficulties staying asleep: Endorsed. He described his sleep as occasionally interrupted. This seemed to be occurring more recently lately and causes for this were unknown.  Feels rested and refreshed upon awakening: Endorsed.  History of snoring: Denied. History of waking up gasping for air: Denied. Witnessed breath cessation while asleep: Denied.  History of vivid dreaming: Denied. Excessive movement while asleep: Denied. Instances of acting out his dreams: Denied.  Psychiatric/Behavioral Health History: Depression:  Denied. He described himself as a "happy guy" and denied any mental health concerns or prior diagnoses. Current or remote suicidal ideation, intent, or plan was denied.  Anxiety: Denied. Mania: Denied. Trauma History: Denied. Visual/auditory hallucinations: Denied. Delusional thoughts: Denied.  Tobacco: Denied. Alcohol: He reported occasional alcohol consumption, largely in social settings. He denied a history of problematic alcohol abuse or dependence.  Recreational drugs: Denied. Caffeine: He reported consuming approximately four cups of coffee in the morning.   Family History: Problem Relation Age of Onset  . Breast cancer Mother   . Cancer Mother   . Diabetes Mother   . Heart disease Father   . Hyperlipidemia Father   . Hypertension Father   . Diabetes Brother   . Heart disease Brother        CABG  . Dementia Paternal Grandmother        Possible Alzheimer's disease  . Alcohol abuse Neg Hx   . COPD Neg Hx   . Depression Neg Hx   . Drug abuse Neg Hx   . Early death Neg Hx   . Hearing loss Neg Hx   . Kidney disease Neg Hx   . Stroke Neg Hx    This information was confirmed by Mr. Sherod.  Academic/Vocational History: Highest level of educational attainment: 12 years. He graduated from high school and described himself as an average (B/C) student in academic settings. Math-based word problems were said to potentially represent a relative weakness.  History of developmental delay: Denied. History of grade repetition: Denied. Enrollment in special education courses: Denied. History of LD/ADHD: Denied.  Employment: Retired. 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   Evaluation Results:   Behavioral Observations: Mr. Petite was unaccompanied, arrived to his appointment on time, and was appropriately dressed and groomed.  He appeared alert and oriented. Observed gait and station were within normal limits. Gross motor functioning appeared intact upon  informal observation and no abnormal movements (e.g., tremors) were noted. His affect was generally relaxed and positive, but did range appropriately given the subject being discussed during the clinical interview or the task at hand during testing procedures. Spontaneous speech was fluent and word finding difficulties were not observed during the clinical interview. Thought processes were coherent, organized, and normal in content. Insight into his cognitive difficulties appeared adequate. During testing, sustained attention was appropriate. Task engagement was adequate and he persisted when challenged. Overall, Mr. Oettinger was cooperative with the clinical interview and subsequent testing procedures.   Adequacy of Effort: The validity of neuropsychological testing is limited by the extent to which the individual being tested may be assumed to have exerted adequate effort during testing. Mr. Foot expressed his intention to perform to the best of his abilities and exhibited adequate task engagement and persistence. Scores across stand-alone and embedded performance validity measures were within expectation. As such, the results of the current evaluation are believed to be a valid representation of Mr. Wheller current cognitive functioning.  Test Results: Mr. Bettendorf was fully oriented at the time of the current evaluation.  Intellectual abilities based upon educational and vocational attainment were estimated to be in the average range. Premorbid abilities were estimated to be within the average range based upon a single-word reading test.   Processing speed was average to above average. Basic attention was below average. More complex attention (e.g., working memory) was average. Executive functioning was largely average to above average. However, he did  experience some difficulties across a complex computerized task assessing pattern recognition and adaptability.  Assessed receptive language abilities were above average. Likewise, Mr. Kirchgessner did not exhibit any difficulties comprehending task instructions and answered all questions asked of him appropriately. Assessed expressive language was below average across phonemic fluency but above average across semantic fluency and confrontation naming.   Assessed visuospatial/visuoconstructional abilities were above average to exceptionally high.    Learning (i.e., encoding) of novel verbal and visual information was below average to average. Spontaneous delayed recall (i.e., retrieval) of previously learned information was below average to well above average. Retention rates were 71% across a story learning task, 83% across a list learning task, and 100% across a shape learning task. Performance across recognition tasks was below average to above average, suggesting evidence for information consolidation.   Results of emotional screening instruments suggested that recent symptoms of generalized anxiety were in the minimal range, while symptoms of depression were within normal limits. A screening instrument assessing recent sleep quality suggested the presence of minimal sleep dysfunction.  Tables of Scores:   Note: This summary of test scores accompanies the interpretive report and should not be considered in isolation without reference to the appropriate sections in the text. Descriptors are based on appropriate normative data and may be adjusted based on clinical judgment. The terms "impaired" and "within normal limits (WNL)" are used when a more specific level of functioning cannot be determined.       Effort Testing:   DESCRIPTOR       ACS Word Choice: --- --- Within Expectation  Dot Counting Test: --- --- Within Expectation  NAB EVI: --- --- Within Expectation  D-KEFS Color Word Effort Index:  --- --- Within Expectation       Orientation:      Raw Score Percentile   NAB Orientation, Form 1 29/29 --- ---       Cognitive Screening:  Raw Score Percentile   SLUMS: 24/30 --- ---       Intellectual Functioning:           Standard Score Percentile   Test of Premorbid Functioning: 104 61 Average       Memory:          NAB Memory Module, Form 2: Standard Score/ T Score Percentile   Total Memory Index 94 34 Average  List Learning       Total Trials 1-3 18/36 (46) 34 Average    List B 4/12 (53) 62 Average    Short Delay Free Recall 6/12 (48) 42 Average    Long Delay Free Recall 5/12 (45) 31 Average    Retention Percentage 83 (46) 34 Average    Recognition Discriminability 6 (42) 21 Below Average  Shape Learning       Total Trials 1-3 15/27 (53) 62 Average    Delayed Recall 7/9 (63) 91 Well Above Average    Retention Percentage 100 (50) 50 Average    Recognition Discriminability 8 (57) 75 Above Average  Story Learning       Immediate Recall 32/80 (41) 18 Below Average    Delayed Recall 15/40 (42) 21 Below Average    Retention Percentage 71 (42) 21 Below Average  Daily Living Memory       Immediate Recall 39/51 (48) 42 Average    Delayed Recall 12/17 (46) 34 Average    Retention Percentage  (41) 18 Below Average    Recognition Hits 10/10 (60) 84 Above Average       Attention/Executive Function:          Trail Making Test (TMT): Raw Score (T Score) Percentile     Part A 26 secs.,  0 errors (57) 75 Above Average    Part B 65 secs.,  0 errors (57) 75 Above Average         Scaled Score Percentile   WAIS-IV Coding: 8 25 Average       NAB Attention Module, Form 1: T Score Percentile     Digits Forward 41 18 Below Average    Digits Backwards 46 34 Average       D-KEFS Color-Word Interference Test: Raw Score (Scaled Score) Percentile     Color Naming 35 secs. (9) 37 Average    Word Reading 24 secs. (10) 50 Average    Inhibition 68 secs. (10) 50 Average       Total Errors 1 error (11) 63 Average    Inhibition/Switching 92 secs. (8) 25 Average      Total Errors 1 error (12) 75 Above Average       [employer] Test: Raw Score Percentile     Categories (trials) 1 (64) 6-10 Well Below Average    Total Errors 42 2 Exceptionally Low    Perseverative Errors 11 46 Average    Non-Perseverative Errors 31 <1 Exceptionally Low    Failure to Maintain Set 1 --- ---       NAB Executive Functions Module, Form 1: T Score Percentile     Judgment 58 79 Above Average       Language:          Verbal Fluency Test: Raw Score (T Score) Percentile     Phonemic Fluency (FAS) 26 (41) 18 Below Average    Animal Fluency 23 (62) 88 Above Average        NAB Language Module, Form 2: T Score Percentile  Auditory Comprehension 58 79 Above Average    Naming 31/31 (57) 75 Above Average       Visuospatial/Visuoconstruction:      Raw Score Percentile   Clock Drawing: 10/10 --- Within Normal Limits       NAB Spatial Module, Form 2: T Score Percentile     Figure Drawing Copy 72 99 Exceptionally High        Scaled Score Percentile   WAIS-IV Block Design: 14 91 Above Average       Mood and Personality:      Raw Score Percentile   Beck Depression Inventory - II: 6 --- Within Normal Limits  PROMIS Anxiety Questionnaire: 12 --- None to Slight       Additional Questionnaires:      Raw Score Percentile   PROMIS Sleep Disturbance Questionnaire: 20 --- None to Slight   Informed Consent and Coding/Compliance:   Mr. Mainwaring was provided with a verbal description of the nature and purpose of the present neuropsychological evaluation. Also reviewed were the foreseeable risks and/or discomforts and benefits of the procedure, limits of confidentiality, and mandatory reporting requirements of this provider. The patient was given the opportunity to ask questions and receive answers about the evaluation. Oral consent to participate was provided by the patient.    This evaluation was conducted by Christia Reading, Ph.D., licensed clinical neuropsychologist. Mr. Trilby Leaver completed a comprehensive clinical interview with Dr. Melvyn Novas, billed as one unit [phone], and 140 minutes of cognitive testing and scoring, billed as one unit [phone] and four additional units [id number]. Psychometrist Cruzita Lederer, B.S., assisted Dr. Melvyn Novas with test administration and scoring procedures. As a separate and discrete service, Dr. Melvyn Novas spent a total of 130 minutes in interpretation and report writing billed as one unit [phone] and one unit [phone].

## 2020-09-20 NOTE — Progress Notes (Signed)
   Psychometrician Note   Cognitive testing was administered to [employer] by Cruzita Lederer, B.S. (psychometrist) under the supervision of Dr. Christia Reading, Ph.D., licensed psychologist on 09/20/2020. Mr. Heidecker did not appear overtly distressed by the testing session per behavioral observation or responses across self-report questionnaires. Dr. Christia Reading, Ph.D. checked in with Mr. Achille as needed to manage any distress related to testing procedures (if applicable). Rest breaks were offered.    The battery of tests administered was selected by Dr. Christia Reading, Ph.D. with consideration to Mr. Chamberlin's current level of functioning, the nature of his symptoms, emotional and behavioral responses during interview, level of literacy, observed level of motivation/effort, and the nature of the referral question. This battery was communicated to the psychometrist. Communication between Dr. Christia Reading, Ph.D. and the psychometrist was ongoing throughout the evaluation and Dr. Christia Reading, Ph.D. was immediately accessible at all times. Dr. Christia Reading, Ph.D. provided supervision to the psychometrist on the date of this service to the extent necessary to assure the quality of all services provided.    Jasn Shultis will return within approximately 1-2 weeks for an interactive feedback session with Dr. Melvyn Novas at which time his test performances, clinical impressions, and treatment recommendations will be reviewed in detail. Mr. Swaney understands he can contact our office should he require our assistance before this time.  A total of 140 minutes of billable time were spent face-to-face with Mr. Ehle by the psychometrist. This includes both test administration and scoring time. Billing for these services is reflected in the clinical report generated by Dr. Christia Reading, Ph.D..  This note reflects time spent with the psychometrician and does not include test  scores or any clinical interpretations made by Dr. Melvyn Novas. The full report will follow in a separate note.

## 2020-10-02 ENCOUNTER — Ambulatory Visit (INDEPENDENT_AMBULATORY_CARE_PROVIDER_SITE_OTHER): Payer: Medicare HMO | Admitting: Psychology

## 2020-10-02 ENCOUNTER — Other Ambulatory Visit: Payer: Self-pay

## 2020-10-02 DIAGNOSIS — R4189 Other symptoms and signs involving cognitive functions and awareness: Secondary | ICD-10-CM | POA: Diagnosis not present

## 2020-10-02 DIAGNOSIS — E119 Type 2 diabetes mellitus without complications: Secondary | ICD-10-CM

## 2020-10-02 DIAGNOSIS — G4709 Other insomnia: Secondary | ICD-10-CM | POA: Diagnosis not present

## 2020-10-02 NOTE — Progress Notes (Signed)
   Neuropsychology Feedback Session Billy Fernandez. [place] Department of Neurology  Reason for Referral:   Billy Fernandez a 69 y.o. right-handed Caucasian male referred by Billy Fernandez, M.D.,to characterize hiscurrent cognitive functioning and assist with diagnostic clarity and treatment planning in the context of subjective cognitive decline.   Feedback:   Billy Fernandez completed a comprehensive neuropsychological evaluation on 09/20/2020. Please refer to that encounter for the full report and recommendations. Briefly, results suggested neuropsychological functioning within normal limits. Relative weaknesses (i.e., below average normative range) were exhibited across a digit repetition task assessing basic attention and a phonemic fluency task. He struggled across a computerized task assessing pattern recognition and adaptability; however, all other assessments of executive functioning were strong. Very mild performance variability was exhibited across memory measures; however, scores remained within expectation. Factors which can create and maintain cognitive inefficiencies can include various psychosocial stressors and periods of disrupted sleep. Billy Fernandez does have a history of type 2 diabetes and other cardiovascular ailments. While there was no neuroimaging available to examine the extent or lack of small vessel ischemic changes, it is possible that these conditions could influence cognitive performance to a mild degree.  Billy Fernandez was accompanied by his wife during the current feedback appointment. Content of the current session focused on the results of his neuropsychological evaluation. Billy Fernandez was given the opportunity to ask questions and his questions were answered. He was encouraged to reach out should additional questions arise. A copy of his report was provided at the conclusion of the visit.      17 minutes were spent conducting the current  feedback session with Billy Fernandez, billed as one unit [phone].

## 2020-10-02 NOTE — Patient Instructions (Signed)
Should Billy Fernandez report concerns regarding cognitive and/or functional decline in the future, a repeat evaluation would be warranted at that time. The current evaluation will serve as an excellent baseline to compare future evaluations against.   Billy Fernandez is encouraged to attend to lifestyle factors for brain health (e.g., regular physical exercise, good nutrition habits, regular participation in cognitively-stimulating activities, and general stress management techniques), which are likely to have benefits for both emotional adjustment and cognition. In fact, in addition to promoting good general health, regular exercise incorporating aerobic activities (e.g., brisk walking, jogging, cycling, etc.) has been demonstrated to be a very effective treatment for depression and stress, with similar efficacy rates to both antidepressant medication and psychotherapy. Optimal control of vascular risk factors (including safe cardiovascular exercise and adherence to dietary recommendations) is encouraged.   If interested, there are some activities which have therapeutic value and can be useful in keeping him cognitively stimulated. For suggestions, Billy Fernandez is encouraged to go to the following website: https://www.[facility].org/get-to-know-barrow/centers-programs/neurorehabilitation-center/neuro-rehab-apps-and-games/ which has options, categorized by level of difficulty. It should be noted that these activities should not be viewed as a substitute for therapy.  When learning new information, he would benefit from information being broken up into small, manageable pieces. He may also find it helpful to articulate the material in his own words and in a context to promote encoding at the onset of a new task. This material may need to be repeated multiple times to promote encoding.  Memory can be improved using internal strategies such as rehearsal, repetition, chunking, mnemonics, association, and  imagery. External strategies such as written notes in a consistently used memory journal, visual and nonverbal auditory cues such as a calendar on the refrigerator or appointments with alarm, such as on a cell phone, can also help maximize recall.    To address problems with fluctuating attention, he may wish to consider:   -Avoiding external distractions when needing to concentrate   -Limiting exposure to fast paced environments with multiple sensory demands   -Writing down complicated information and using checklists   -Attempting and completing one task at a time (i.e., no multi-tasking)   -Verbalizing aloud each step of a task to maintain focus   -Reducing the amount of information considered at one time

## 2020-10-17 ENCOUNTER — Other Ambulatory Visit (INDEPENDENT_AMBULATORY_CARE_PROVIDER_SITE_OTHER): Payer: Medicare HMO

## 2020-10-17 ENCOUNTER — Other Ambulatory Visit: Payer: Self-pay

## 2020-10-17 DIAGNOSIS — E119 Type 2 diabetes mellitus without complications: Secondary | ICD-10-CM

## 2020-10-17 LAB — HEMOGLOBIN A1C: Hgb A1c MFr Bld: 6.5 % (ref 4.6–6.5)

## 2020-10-27 ENCOUNTER — Encounter: Payer: Self-pay | Admitting: Internal Medicine

## 2020-10-31 NOTE — Progress Notes (Signed)
Subjective:    Patient ID: Billy Fernandez, male    DOB: 06-Aug-1951, 69 y.o.   MRN: [id number]  HPI The patient is here for an acute visit.   BP variations - in the past week his BP at home has been too high, 155/96 the other day.  He did bring his cuff in here and when compared to ours- his cuff is not accurate.  He will obtain a new cuff.  He is eating a very healthy diet-primarily plant-based.  He is bruising a lot and wants to eminate the ASA.  He is only taking it 3 times a week now.  Sugars at home 100-128.  He is very active.  He plans on doing a Cologuard test by a company for screening of colon cancer.  Medications and allergies reviewed with patient and updated if appropriate.  Patient Active Problem List   Diagnosis Date Noted  . Cervical radiculopathy at C6 04/14/2020  . Insomnia 12/31/2018  . Diabetes mellitus type 2 07/01/2014  . Eczema 06/29/2013  . Hyperlipidemia 06/29/2013  . Low libido 05/28/2013  . Essential hypertension, benign 05/28/2013    Current Outpatient Medications on File Prior to Visit  Medication Sig Dispense Refill  . atorvastatin (LIPITOR) 20 MG tablet Take 1 tablet (20 mg total) by mouth daily. (Patient taking differently: Take 20 mg by mouth every other day. ) 90 tablet 0  . carvedilol (COREG) 6.25 MG tablet Take 1 tablet (6.25 mg total) by mouth 2 (two) times daily. 180 tablet 0  . fish oil-omega-3 fatty acids 1000 MG capsule Take 2 g by mouth daily.    . Lancets (FREESTYLE) lancets Use to check blood sugars. E11.9 100 each 1  . losartan-hydrochlorothiazide (HYZAAR) 100-12.5 MG tablet Take 1 tablet by mouth daily. 90 tablet 0  . Magnesium 250 MG TABS Take by mouth daily.    . metFORMIN (GLUCOPHAGE) 1000 MG tablet Take 0.5 tablets (500 mg total) by mouth 2 (two) times daily with a meal. 90 tablet 0  . ONETOUCH VERIO test strip use to check blood sugar once daily 100 each 0  . zinc gluconate 50 MG tablet Take 50 mg by mouth daily.    Marland Kitchen  zolpidem (AMBIEN) 10 MG tablet Take 1 tablet (10 mg total) by mouth at bedtime as needed for sleep. 30 tablet 1   No current facility-administered medications on file prior to visit.    Past Medical History:  Diagnosis Date  . Cervical radiculopathy at C6 04/14/2020  . Essential hypertension, benign 05/28/2013  . Hyperlipidemia 06/29/2013  . Type 2 diabetes mellitus 07/01/2014    History reviewed. No pertinent surgical history.  Social History   Socioeconomic History  . Marital status: Married    Spouse name: Not on file  . Number of children: Not on file  . Years of education: 42  . Highest education level: High school graduate  Occupational History  . Occupation: Retired  Tobacco Use  . Smoking status: Former Research scientist (life sciences)  . Smokeless tobacco: Never Used  Substance and Sexual Activity  . Alcohol use: Yes    Alcohol/week: 1.0 standard drink    Types: 1 Cans of beer per week  . Drug use: Not Currently  . Sexual activity: Yes  Other Topics Concern  . Not on file  Social History Narrative  . Not on file   Social Determinants of Health   Financial Resource Strain:   . Difficulty of Paying Living Expenses: Not on file  Food  Insecurity:   . Worried About Charity fundraiser in the Last Year: Not on file  . Ran Out of Food in the Last Year: Not on file  Transportation Needs:   . Lack of Transportation (Medical): Not on file  . Lack of Transportation (Non-Medical): Not on file  Physical Activity:   . Days of Exercise per Week: Not on file  . Minutes of Exercise per Session: Not on file  Stress:   . Feeling of Stress : Not on file  Social Connections:   . Frequency of Communication with Friends and Family: Not on file  . Frequency of Social Gatherings with Friends and Family: Not on file  . Attends Religious Services: Not on file  . Active Member of Clubs or Organizations: Not on file  . Attends Archivist Meetings: Not on file  . Marital Status: Not on file     Family History  Problem Relation Age of Onset  . Breast cancer Mother   . Cancer Mother   . Diabetes Mother   . Heart disease Father   . Hyperlipidemia Father   . Hypertension Father   . Diabetes Brother   . Heart disease Brother        cabg  . Dementia Paternal Grandmother        Possible Alzheimer's disease  . Alcohol abuse Neg Hx   . COPD Neg Hx   . Depression Neg Hx   . Drug abuse Neg Hx   . Early death Neg Hx   . Hearing loss Neg Hx   . Kidney disease Neg Hx   . Stroke Neg Hx     Review of Systems  Constitutional: Negative for chills and fever.  Respiratory: Negative for cough, shortness of breath and wheezing.   Cardiovascular: Positive for palpitations. Negative for chest pain and leg swelling.  Neurological: Positive for headaches (occ). Negative for dizziness and light-headedness.       Objective:   Vitals:   11/01/20 0951  BP: 140/82  Pulse: 64  Temp: 98.2 F (36.8 C)  SpO2: 96%   BP Readings from Last 3 Encounters:  11/01/20 140/82  04/14/20 138/82  12/25/18 122/80   Wt Readings from Last 3 Encounters:  11/01/20 192 lb 9.6 oz (87.4 kg)  04/14/20 191 lb (86.6 kg)  12/25/18 192 lb 6.4 oz (87.3 kg)   Body mass index is 27.64 kg/m.   Physical Exam    Constitutional: Appears well-developed and well-nourished. No distress.  Head: Normocephalic and atraumatic.  Neck: Neck supple. No tracheal deviation present. No thyromegaly present.  No cervical lymphadenopathy Cardiovascular: Normal rate, regular rhythm and normal heart sounds.  No murmur heard. No carotid bruit .  No edema Pulmonary/Chest: Effort normal and breath sounds normal. No respiratory distress. No has no wheezes. No rales.  Skin: Skin is warm and dry. Not diaphoretic.  Psychiatric: Normal mood and affect. Behavior is normal.        Assessment & Plan:    See Problem List for Assessment and Plan of chronic medical problems.    This visit occurred during the SARS-CoV-2  public health emergency.  Safety protocols were in place, including screening questions prior to the visit, additional usage of staff PPE, and extensive cleaning of exam room while observing appropriate contact time as indicated for disinfecting solutions.

## 2020-11-01 ENCOUNTER — Encounter: Payer: Self-pay | Admitting: Internal Medicine

## 2020-11-01 ENCOUNTER — Ambulatory Visit (INDEPENDENT_AMBULATORY_CARE_PROVIDER_SITE_OTHER): Payer: Medicare HMO | Admitting: Internal Medicine

## 2020-11-01 ENCOUNTER — Other Ambulatory Visit: Payer: Self-pay

## 2020-11-01 VITALS — BP 140/82 | HR 64 | Temp 98.2°F | Ht 70.0 in | Wt 192.6 lb

## 2020-11-01 DIAGNOSIS — E119 Type 2 diabetes mellitus without complications: Secondary | ICD-10-CM | POA: Diagnosis not present

## 2020-11-01 DIAGNOSIS — I10 Essential (primary) hypertension: Secondary | ICD-10-CM

## 2020-11-01 DIAGNOSIS — E7849 Other hyperlipidemia: Secondary | ICD-10-CM | POA: Diagnosis not present

## 2020-11-01 LAB — LIPID PANEL
Cholesterol: 132 mg/dL (ref 0–200)
HDL: 48 mg/dL (ref >39.00)
LDL Cholesterol: 46 mg/dL (ref 0–99)
NonHDL: 84.45
Total CHOL/HDL Ratio: 3
Triglycerides: 194 mg/dL — ABNORMAL HIGH (ref 0.0–149.0)
VLDL: 38.8 mg/dL (ref 0.0–40.0)

## 2020-11-01 LAB — COMPREHENSIVE METABOLIC PANEL
ALT: 25 U/L (ref 0–53)
AST: 26 U/L (ref 0–37)
Albumin: 4.1 g/dL (ref 3.5–5.2)
Alkaline Phosphatase: 43 U/L (ref 39–117)
BUN: 18 mg/dL (ref 6–23)
CO2: 33 mEq/L — ABNORMAL HIGH (ref 19–32)
Calcium: 9.9 mg/dL (ref 8.4–10.5)
Chloride: 102 mEq/L (ref 96–112)
Creatinine, Ser: 1 mg/dL (ref 0.40–1.50)
GFR: 77.19 mL/min (ref >60.00)
Glucose, Bld: 110 mg/dL — ABNORMAL HIGH (ref 70–99)
Potassium: 4.8 mEq/L (ref 3.5–5.1)
Sodium: 139 mEq/L (ref 135–145)
Total Bilirubin: 0.7 mg/dL (ref 0.2–1.2)
Total Protein: 6.8 g/dL (ref 6.0–8.3)

## 2020-11-01 NOTE — Assessment & Plan Note (Signed)
Chronic BP controlled here today-he will get a new blood pressure cuff at home since this does not seem to be accurate today continue to monitor Continue losartan-hydrochlorothiazide 100-12.5 mg daily, Coreg 6.25 mg twice daily cmp

## 2020-11-01 NOTE — Patient Instructions (Addendum)
  Blood work was ordered.     Medications reviewed and updated.  Changes include :  none     Please followup in 6 months

## 2020-11-01 NOTE — Assessment & Plan Note (Signed)
Chronic Check lipid panel  Continue atorvastatin 20 mg 3 times a week Regular exercise and healthy diet encouraged

## 2020-11-01 NOTE — Assessment & Plan Note (Addendum)
Chronic Lab Results  Component Value Date   HGBA1C 6.5 10/17/2020   Well controlled Continue Metformin 500 mg twice daily Encourage regular exercise, diabetic diet Follow-up in 6 months

## 2020-11-02 ENCOUNTER — Encounter: Payer: Self-pay | Admitting: Internal Medicine

## 2020-11-08 ENCOUNTER — Other Ambulatory Visit: Payer: Self-pay

## 2020-11-08 ENCOUNTER — Other Ambulatory Visit: Payer: Self-pay | Admitting: *Deleted

## 2020-11-08 DIAGNOSIS — Z1211 Encounter for screening for malignant neoplasm of colon: Secondary | ICD-10-CM

## 2020-11-08 NOTE — Progress Notes (Signed)
FIT Test only.

## 2020-11-10 ENCOUNTER — Other Ambulatory Visit: Payer: Self-pay | Admitting: Podiatry

## 2020-11-22 ENCOUNTER — Encounter: Payer: Self-pay | Admitting: Internal Medicine

## 2020-11-27 DIAGNOSIS — R69 Illness, unspecified: Secondary | ICD-10-CM | POA: Diagnosis not present

## 2020-12-12 ENCOUNTER — Other Ambulatory Visit: Payer: Self-pay

## 2020-12-12 DIAGNOSIS — Z1211 Encounter for screening for malignant neoplasm of colon: Secondary | ICD-10-CM

## 2020-12-13 ENCOUNTER — Other Ambulatory Visit: Payer: Self-pay | Admitting: Podiatry

## 2020-12-13 NOTE — Telephone Encounter (Signed)
Please advise 

## 2020-12-14 NOTE — Telephone Encounter (Signed)
Can refill if still having pain

## 2020-12-22 LAB — FECAL OCCULT BLOOD, IMMUNOCHEMICAL: Fecal Occult Bld: NEGATIVE

## 2020-12-25 ENCOUNTER — Telehealth: Payer: Self-pay

## 2020-12-25 NOTE — Telephone Encounter (Signed)
Billy Fernandez (per patient's request/Designated Party release), informed negative FIT Test results/No evidence of blood in stool, and Dr. Lawerance Bach, was able to view results. Billy Fernandez verbalized understanding.

## 2021-01-06 ENCOUNTER — Other Ambulatory Visit: Payer: Self-pay | Admitting: Internal Medicine

## 2021-01-06 ENCOUNTER — Other Ambulatory Visit: Payer: Self-pay | Admitting: Podiatry

## 2021-01-06 NOTE — Telephone Encounter (Signed)
Please advise 

## 2021-01-26 ENCOUNTER — Telehealth: Payer: Self-pay | Admitting: Internal Medicine

## 2021-01-26 NOTE — Progress Notes (Signed)
  Chronic Care Management   Outreach Note  01/26/2021 Name: Billy Fernandez MRN: [id number] DOB: 1951/08/13  Referred by: Binnie Rail, MD Reason for referral : No chief complaint on file.   An unsuccessful telephone outreach was attempted today. The patient was referred to the pharmacist for assistance with care management and care coordination.   Follow Up Plan:   Carley Perdue [employer] Scheduler

## 2021-02-09 ENCOUNTER — Encounter: Payer: Self-pay | Admitting: Internal Medicine

## 2021-02-09 ENCOUNTER — Telehealth: Payer: Self-pay | Admitting: Internal Medicine

## 2021-02-09 DIAGNOSIS — R002 Palpitations: Secondary | ICD-10-CM

## 2021-02-09 DIAGNOSIS — I10 Essential (primary) hypertension: Secondary | ICD-10-CM

## 2021-02-09 NOTE — Progress Notes (Signed)
  Chronic Care Management   Note  02/09/2021 Name: Billy Fernandez MRN: [id number] DOB: [birth year]  Billy Fernandez is a 70 y.o. year old male who is a primary care patient of Burns, Claudina Lick, MD. I reached out to [employer] by phone today in response to a referral sent by Billy Fernandez's PCP, Quay Burow, Claudina Lick, MD.   Billy Fernandez was given information about Chronic Care Management services today including:  1. CCM service includes personalized support from designated clinical staff supervised by his physician, including individualized plan of care and coordination with other care providers 2. 24/7 contact phone numbers for assistance for urgent and routine care needs. 3. Service will only be billed when office clinical staff spend 20 minutes or more in a month to coordinate care. 4. Only one practitioner may furnish and bill the service in a calendar month. 5. The patient may stop CCM services at any time (effective at the end of the month) by phone call to the office staff.   Patient agreed to services and verbal consent obtained.   Follow up plan:   Carley Perdue [employer] Scheduler

## 2021-03-02 ENCOUNTER — Other Ambulatory Visit: Payer: Self-pay | Admitting: Internal Medicine

## 2021-03-05 ENCOUNTER — Telehealth: Payer: Self-pay | Admitting: Internal Medicine

## 2021-03-05 MED ORDER — HYDROCHLOROTHIAZIDE 12.5 MG PO TABS: 12.5000 mg | ORAL_TABLET | Freq: Every day | ORAL | 3 refills | Status: DC

## 2021-03-05 NOTE — Telephone Encounter (Signed)
He cannot double his current medication.  Lets add hydrochlorothiazide 12.5 mg told he is already taking.  He should schedule an appointment for later this week to follow-up.  Prescription sent to [facility].

## 2021-03-05 NOTE — Telephone Encounter (Signed)
Patients wife is calling and is requesting a call back. She had some questions about the medications. She can be reached at [phone].

## 2021-03-05 NOTE — Telephone Encounter (Signed)
Patients wife, Sharyn Lull called and said that for the past few months the patient has been eating salty foods while on vacation and his BP is now running 168/99. She was wondering if he could double up on his medication or if there was anything that Dr. Quay Burow could recommend. Please advise.   Phone: [phone]

## 2021-03-05 NOTE — Telephone Encounter (Signed)
Message left for patient and also sent my-chart message as well.

## 2021-03-06 DIAGNOSIS — R002 Palpitations: Secondary | ICD-10-CM | POA: Insufficient documentation

## 2021-03-06 NOTE — Telephone Encounter (Signed)
He should take both pills (losartan-hctz 100-12.5 mg and hctz 12.5 mg) in the morning at the same time.     Binnie Rail, MD    Last read by [name] at 11:41 AM on 03/06/2021.

## 2021-03-14 ENCOUNTER — Ambulatory Visit: Payer: Medicare HMO | Admitting: Internal Medicine

## 2021-03-18 NOTE — Patient Instructions (Addendum)
  Blood work was ordered.     Medications changes include :   Losartan-hctz 100-25 mg daily  Your prescription(s) have been submitted to your pharmacy. Please take as directed and contact our office if you believe you are having problem(s) with the medication(s).    Please followup in 6 months

## 2021-03-18 NOTE — Progress Notes (Signed)
Subjective:    Patient ID: Billy Fernandez, male    DOB: July 11, 1951, 70 y.o.   MRN: [id number]  HPI The patient is here for follow up of their chronic medical problems, including DM, htn, hyperlipidemia   BP for months has been 148/95.  BP after [place] was much higher  - 180/104.  He was not eating as well in [place] and was consuming more sodium.  Now that he is back at home he is eating healthier   BP better with additional 12.5 mg of hctz  Sugar at home low 100's    Medications and allergies reviewed with patient and updated if appropriate.  Patient Active Problem List   Diagnosis Date Noted  . Palpitations 03/06/2021  . Cervical radiculopathy at C6 04/14/2020  . Insomnia 12/31/2018  . Diabetes mellitus type 2 07/01/2014  . Eczema 06/29/2013  . Hyperlipidemia 06/29/2013  . Low libido 05/28/2013  . Essential hypertension, benign 05/28/2013    Current Outpatient Medications on File Prior to Visit  Medication Sig Dispense Refill  . atorvastatin (LIPITOR) 20 MG tablet Take 1 tablet (20 mg total) by mouth daily. (Patient taking differently: Take 20 mg by mouth every other day.) 90 tablet 0  . carvedilol (COREG) 6.25 MG tablet Take 1 tablet (6.25 mg total) by mouth 2 (two) times daily. 180 tablet 0  . fish oil-omega-3 fatty acids 1000 MG capsule Take 2 g by mouth daily.    . Lancets (FREESTYLE) lancets Use to check blood sugars. E11.9 100 each 1  . Magnesium 250 MG TABS Take by mouth daily.    . meloxicam (MOBIC) 15 MG tablet TAKE 1 TABLET BY MOUTH EVERY DAY 30 tablet 0  . metFORMIN (GLUCOPHAGE) 1000 MG tablet TAKE 0.5 TABLETS (500 MG TOTAL) BY MOUTH TWICE TIMES DAILY WITH A MEAL. 90 tablet 0  . ONETOUCH VERIO test strip use to check blood sugar once daily 100 each 0  . SHINGRIX injection     . zinc gluconate 50 MG tablet Take 50 mg by mouth daily.    . [DISCONTINUED] losartan-hydrochlorothiazide (HYZAAR) 100-12.5 MG tablet TAKE 1 TABLET BY MOUTH EVERY DAY 90 tablet 0   . zolpidem (AMBIEN) 10 MG tablet Take 1 tablet (10 mg total) by mouth at bedtime as needed for sleep. 30 tablet 1   No current facility-administered medications on file prior to visit.    Past Medical History:  Diagnosis Date  . Cervical radiculopathy at C6 04/14/2020  . Essential hypertension, benign 05/28/2013  . Hyperlipidemia 06/29/2013  . Type 2 diabetes mellitus 07/01/2014    History reviewed. No pertinent surgical history.  Social History   Socioeconomic History  . Marital status: Married    Spouse name: Not on file  . Number of children: Not on file  . Years of education: 38  . Highest education level: High school graduate  Occupational History  . Occupation: Retired  Tobacco Use  . Smoking status: Former Research scientist (life sciences)  . Smokeless tobacco: Never Used  Substance and Sexual Activity  . Alcohol use: Yes    Alcohol/week: 1.0 standard drink    Types: 1 Cans of beer per week  . Drug use: Not Currently  . Sexual activity: Yes  Other Topics Concern  . Not on file  Social History Narrative  . Not on file   Social Determinants of Health   Financial Resource Strain: Not on file  Food Insecurity: Not on file  Transportation Needs: Not on file  Physical Activity: Not  on file  Stress: Not on file  Social Connections: Not on file    Family History  Problem Relation Age of Onset  . Breast cancer Mother   . Cancer Mother   . Diabetes Mother   . Heart disease Father   . Hyperlipidemia Father   . Hypertension Father   . Diabetes Brother   . Heart disease Brother        cabg  . Dementia Paternal Grandmother        Possible Alzheimer's disease  . Alcohol abuse Neg Hx   . COPD Neg Hx   . Depression Neg Hx   . Drug abuse Neg Hx   . Early death Neg Hx   . Hearing loss Neg Hx   . Kidney disease Neg Hx   . Stroke Neg Hx     Review of Systems  Constitutional: Negative for fever.  Respiratory: Negative for cough, shortness of breath and wheezing.   Cardiovascular:  Positive for palpitations (chronic, intermittent). Negative for chest pain and leg swelling.  Neurological: Negative for dizziness, light-headedness and headaches.       Objective:   Vitals:   03/19/21 0916  BP: 138/74  Pulse: (!) 52  Temp: 98.1 F (36.7 C)  SpO2: 99%   BP Readings from Last 3 Encounters:  03/19/21 138/74  11/01/20 140/82  04/14/20 138/82   Wt Readings from Last 3 Encounters:  03/19/21 198 lb (89.8 kg)  11/01/20 192 lb 9.6 oz (87.4 kg)  04/14/20 191 lb (86.6 kg)   Body mass index is 28.41 kg/m.   Physical Exam    Constitutional: Appears well-developed and well-nourished. No distress.  HENT:  Head: Normocephalic and atraumatic.  Neck: Neck supple. No tracheal deviation present. No thyromegaly present.  No cervical lymphadenopathy Cardiovascular: Normal rate, regular rhythm and normal heart sounds.   No murmur heard. No carotid bruit .  No edema Pulmonary/Chest: Effort normal and breath sounds normal. No respiratory distress. No has no wheezes. No rales.  Skin: Skin is warm and dry. Not diaphoretic.  Psychiatric: Normal mood and affect. Behavior is normal.    The ASCVD Risk score Mikey Bussing DC Jr., et al., 2013) failed to calculate for the following reasons:   The valid total cholesterol range is 130 to 320 mg/dL   Assessment & Plan:    See Problem List for Assessment and Plan of chronic medical problems.    This visit occurred during the SARS-CoV-2 public health emergency.  Safety protocols were in place, including screening questions prior to the visit, additional usage of staff PPE, and extensive cleaning of exam room while observing appropriate contact time as indicated for disinfecting solutions.

## 2021-03-19 ENCOUNTER — Other Ambulatory Visit: Payer: Self-pay

## 2021-03-19 ENCOUNTER — Ambulatory Visit: Payer: Medicare HMO | Admitting: Internal Medicine

## 2021-03-19 ENCOUNTER — Ambulatory Visit (INDEPENDENT_AMBULATORY_CARE_PROVIDER_SITE_OTHER): Payer: Medicare HMO | Admitting: Internal Medicine

## 2021-03-19 ENCOUNTER — Telehealth: Payer: Self-pay | Admitting: Internal Medicine

## 2021-03-19 ENCOUNTER — Encounter: Payer: Self-pay | Admitting: Internal Medicine

## 2021-03-19 VITALS — BP 138/74 | HR 52 | Temp 98.1°F | Ht 70.0 in | Wt 198.0 lb

## 2021-03-19 DIAGNOSIS — I10 Essential (primary) hypertension: Secondary | ICD-10-CM

## 2021-03-19 DIAGNOSIS — Z8249 Family history of ischemic heart disease and other diseases of the circulatory system: Secondary | ICD-10-CM

## 2021-03-19 DIAGNOSIS — E119 Type 2 diabetes mellitus without complications: Secondary | ICD-10-CM | POA: Diagnosis not present

## 2021-03-19 DIAGNOSIS — E7849 Other hyperlipidemia: Secondary | ICD-10-CM | POA: Diagnosis not present

## 2021-03-19 LAB — COMPREHENSIVE METABOLIC PANEL
ALT: 19 U/L (ref 0–53)
AST: 20 U/L (ref 0–37)
Albumin: 4.4 g/dL (ref 3.5–5.2)
Alkaline Phosphatase: 34 U/L — ABNORMAL LOW (ref 39–117)
BUN: 14 mg/dL (ref 6–23)
CO2: 33 mEq/L — ABNORMAL HIGH (ref 19–32)
Calcium: 10.1 mg/dL (ref 8.4–10.5)
Chloride: 101 mEq/L (ref 96–112)
Creatinine, Ser: 1.09 mg/dL (ref 0.40–1.50)
GFR: 69.42 mL/min (ref >60.00)
Glucose, Bld: 117 mg/dL — ABNORMAL HIGH (ref 70–99)
Potassium: 4.5 mEq/L (ref 3.5–5.1)
Sodium: 140 mEq/L (ref 135–145)
Total Bilirubin: 1.2 mg/dL (ref 0.2–1.2)
Total Protein: 7 g/dL (ref 6.0–8.3)

## 2021-03-19 LAB — LIPID PANEL
Cholesterol: 111 mg/dL (ref 0–200)
HDL: 45.9 mg/dL (ref >39.00)
LDL Cholesterol: 38 mg/dL (ref 0–99)
NonHDL: 65.02
Total CHOL/HDL Ratio: 2
Triglycerides: 133 mg/dL (ref 0.0–149.0)
VLDL: 26.6 mg/dL (ref 0.0–40.0)

## 2021-03-19 LAB — HEMOGLOBIN A1C: Hgb A1c MFr Bld: 6.8 % — ABNORMAL HIGH (ref 4.6–6.5)

## 2021-03-19 MED ORDER — LOSARTAN POTASSIUM-HCTZ 100-25 MG PO TABS: 1.0000 | ORAL_TABLET | Freq: Every day | ORAL | 3 refills | Status: DC

## 2021-03-19 NOTE — Telephone Encounter (Signed)
Patients wife called and said that [facility] did not receive the referral that was sent over on 03/06/21. She was wondering if it could be sent again. Please advise    Fax: [phone] Attn. April

## 2021-03-19 NOTE — Assessment & Plan Note (Signed)
Chronic Diet controlled Sugars are not controlled at home Will check A1c We will schedule eye exam

## 2021-03-19 NOTE — Assessment & Plan Note (Signed)
Chronic Blood pressure was elevated-I increased the dose of his hydrochlorothiazide to 25 mg daily and that has helped Blood pressure well controlled at home and looks good here today Continue losartan-hydrochlorothiazide 100-25 mg daily and carvedilol 6.25 mg twice daily CMP

## 2021-03-19 NOTE — Assessment & Plan Note (Signed)
Chronic Check lipid panel, CMP Continue atorvastatin 20 mg daily 

## 2021-03-20 NOTE — Telephone Encounter (Signed)
Pt has been scheduled with [facility]

## 2021-03-22 DIAGNOSIS — E785 Hyperlipidemia, unspecified: Secondary | ICD-10-CM | POA: Diagnosis not present

## 2021-03-22 DIAGNOSIS — E119 Type 2 diabetes mellitus without complications: Secondary | ICD-10-CM | POA: Diagnosis not present

## 2021-03-22 DIAGNOSIS — Z7722 Contact with and (suspected) exposure to environmental tobacco smoke (acute) (chronic): Secondary | ICD-10-CM | POA: Diagnosis not present

## 2021-03-22 DIAGNOSIS — Z7984 Long term (current) use of oral hypoglycemic drugs: Secondary | ICD-10-CM | POA: Diagnosis not present

## 2021-03-22 DIAGNOSIS — Z833 Family history of diabetes mellitus: Secondary | ICD-10-CM | POA: Diagnosis not present

## 2021-03-22 DIAGNOSIS — Z809 Family history of malignant neoplasm, unspecified: Secondary | ICD-10-CM | POA: Diagnosis not present

## 2021-03-22 DIAGNOSIS — I10 Essential (primary) hypertension: Secondary | ICD-10-CM | POA: Diagnosis not present

## 2021-03-22 DIAGNOSIS — Z87891 Personal history of nicotine dependence: Secondary | ICD-10-CM | POA: Diagnosis not present

## 2021-04-13 ENCOUNTER — Other Ambulatory Visit: Payer: Self-pay

## 2021-04-13 ENCOUNTER — Telehealth: Payer: Self-pay

## 2021-04-13 NOTE — Progress Notes (Signed)
Chronic Care Management Pharmacy Assistant   Name: Billy Fernandez  MRN: [id number] DOB: 01/11/1951  Reason for Encounter: Initial Questions Appointment: [facility] 04/16/21 @ 10:30 am   Recent office visits:  11/01/20 Burns (PCP) - No medication changes, continue current regimen. Patient instructed to  f/u in 6 mo.  03/19/21 Burns (PCP) - Increased dose of hydrochlorothiazide to 25 mg daily & Losartan-hctz to 100-25 mg daily, f/u 6 mo. Referral sent for cardiologists. Patient will f/u in 6 mo.  Recent consult visits:  None noted  Hospital visits:  None in previous 6 months  Medications: Outpatient Encounter Medications as of 04/13/2021  Medication Sig  . atorvastatin (LIPITOR) 20 MG tablet Take 1 tablet (20 mg total) by mouth daily. (Patient taking differently: Take 20 mg by mouth every other day.)  . carvedilol (COREG) 6.25 MG tablet Take 1 tablet (6.25 mg total) by mouth 2 (two) times daily.  . fish oil-omega-3 fatty acids 1000 MG capsule Take 2 g by mouth daily.  . Lancets (FREESTYLE) lancets Use to check blood sugars. E11.9  . losartan-hydrochlorothiazide (HYZAAR) 100-25 MG tablet Take 1 tablet by mouth daily.  . Magnesium 250 MG TABS Take by mouth daily.  . meloxicam (MOBIC) 15 MG tablet TAKE 1 TABLET BY MOUTH EVERY DAY  . metFORMIN (GLUCOPHAGE) 1000 MG tablet TAKE 0.5 TABLETS (500 MG TOTAL) BY MOUTH TWICE TIMES DAILY WITH A MEAL.  Marland Kitchen ONETOUCH VERIO test strip use to check blood sugar once daily  . SHINGRIX injection   . zinc gluconate 50 MG tablet Take 50 mg by mouth daily.  Marland Kitchen zolpidem (AMBIEN) 10 MG tablet Take 1 tablet (10 mg total) by mouth at bedtime as needed for sleep.   No facility-administered encounter medications on file as of 04/13/2021.    Have you seen any other providers since your last visit?  Patient stated he has not seen any other providers..  Any changes in your medications or health?  Patient states Dr. Quay Burow changed some of his medications on his last  visit with her.  Any side effects from any medications?  Patient stated no side effects at this time.  Do you have an symptoms or problems not managed by your medications?  Patient stated no symptoms at this time.  Any concerns about your health right now? Patient stated no concerns at this time.  Has your provider asked that you check blood pressure, blood sugar, or follow special diet at home? Yes. Patient states he hasn't check neither of them today yet. 04/12/21 Blood sugar 109  04/12/21 BP 133/87 am - 133/79 pm  Do you get any type of exercise on a regular basis?  Patient states he walks some and is pretty active still, he goes camping a lot and requires a lot of walking.  Can you think of a goal you would like to reach for your health?  Patient states he wants to stay healthy and be able to stop taking  Medications.  Do you have any problems getting your medications?  Patient stated no problems getting medication.  Is there anything that you would like to discuss during the appointment?  Patient stated not at this time, patient also stated he didn't understand why he was coming in for an appointment because he had just seen his PCP.  Please bring medications and supplements to appointment  Star Rating Drugs: losartan-hydrochlorothiazide - last fill 03/19/21 90D metFORMIN - last fill 02/24/21 [place], [place] Pharmacists Assistant [phone]  Time Spent: 67

## 2021-04-15 NOTE — Patient Instructions (Addendum)
Blood work was ordered.     Medications changes include :  none     Please followup in 6 months    Health Maintenance, Male Adopting a healthy lifestyle and getting preventive care are important in promoting health and wellness. Ask your health care provider about:  The right schedule for you to have regular tests and exams.  Things you can do on your own to prevent diseases and keep yourself healthy. What should I know about diet, weight, and exercise? Eat a healthy diet  Eat a diet that includes plenty of vegetables, fruits, low-fat dairy products, and lean protein.  Do not eat a lot of foods that are high in solid fats, added sugars, or sodium.   Maintain a healthy weight Body mass index (BMI) is a measurement that can be used to identify possible weight problems. It estimates body fat based on height and weight. Your health care provider can help determine your BMI and help you achieve or maintain a healthy weight. Get regular exercise Get regular exercise. This is one of the most important things you can do for your health. Most adults should:  Exercise for at least 150 minutes each week. The exercise should increase your heart rate and make you sweat (moderate-intensity exercise).  Do strengthening exercises at least twice a week. This is in addition to the moderate-intensity exercise.  Spend less time sitting. Even light physical activity can be beneficial. Watch cholesterol and blood lipids Have your blood tested for lipids and cholesterol at 69 years of age, then have this test every 5 years. You may need to have your cholesterol levels checked more often if:  Your lipid or cholesterol levels are high.  You are older than 69 years of age.  You are at high risk for heart disease. What should I know about cancer screening? Many types of cancers can be detected early and may often be prevented. Depending on your health history and family history, you may need to  have cancer screening at various ages. This may include screening for:  Colorectal cancer.  Prostate cancer.  Skin cancer.  Lung cancer. What should I know about heart disease, diabetes, and high blood pressure? Blood pressure and heart disease  High blood pressure causes heart disease and increases the risk of stroke. This is more likely to develop in people who have high blood pressure readings, are of African descent, or are overweight.  Talk with your health care provider about your target blood pressure readings.  Have your blood pressure checked: ? Every 3-5 years if you are 18-39 years of age. ? Every year if you are 40 years old or older.  If you are between the ages of 65 and 75 and are a current or former smoker, ask your health care provider if you should have a one-time screening for abdominal aortic aneurysm (AAA). Diabetes Have regular diabetes screenings. This checks your fasting blood sugar level. Have the screening done:  Once every three years after age 45 if you are at a normal weight and have a low risk for diabetes.  More often and at a younger age if you are overweight or have a high risk for diabetes. What should I know about preventing infection? Hepatitis B If you have a higher risk for hepatitis B, you should be screened for this virus. Talk with your health care provider to find out if you are at risk for hepatitis B infection. Hepatitis C Blood testing is recommended for:    for:  Everyone born from [birth year] through 1964.  Anyone with known risk factors for hepatitis C. Sexually transmitted infections (STIs)  You should be screened each year for STIs, including gonorrhea and chlamydia, if: ? You are sexually active and are younger than 69 years of age. ? You are older than 69 years of age and your health care provider tells you that you are at risk for this type of infection. ? Your sexual activity has changed since you were last screened, and you are at  increased risk for chlamydia or gonorrhea. Ask your health care provider if you are at risk.  Ask your health care provider about whether you are at high risk for HIV. Your health care provider may recommend a prescription medicine to help prevent HIV infection. If you choose to take medicine to prevent HIV, you should first get tested for HIV. You should then be tested every 3 months for as long as you are taking the medicine. Follow these instructions at home: Lifestyle  Do not use any products that contain nicotine or tobacco, such as cigarettes, e-cigarettes, and chewing tobacco. If you need help quitting, ask your health care provider.  Do not use street drugs.  Do not share needles.  Ask your health care provider for help if you need support or information about quitting drugs. Alcohol use  Do not drink alcohol if your health care provider tells you not to drink.  If you drink alcohol: ? Limit how much you have to 0-2 drinks a day. ? Be aware of how much alcohol is in your drink. In the [place] one drink equals one 12 oz bottle of beer (355 mL), one 5 oz glass of wine (148 mL), or one 1 oz glass of hard liquor (44 mL). General instructions  Schedule regular health, dental, and eye exams.  Stay current with your vaccines.  Tell your health care provider if: ? You often feel depressed. ? You have ever been abused or do not feel safe at home. Summary  Adopting a healthy lifestyle and getting preventive care are important in promoting health and wellness.  Follow your health care provider's instructions about healthy diet, exercising, and getting tested or screened for diseases.  Follow your health care provider's instructions on monitoring your cholesterol and blood pressure. This information is not intended to replace advice given to you by your health care provider. Make sure you discuss any questions you have with your health care provider. Document Revised: 12/02/2018  Document Reviewed: 12/02/2018 Elsevier Patient Education  2021 [employer].

## 2021-04-15 NOTE — Progress Notes (Signed)
Subjective:    Patient ID: Billy Fernandez, male    DOB: 1951/10/06, 70 y.o.   MRN: [id number]  HPI He is here for a physical exam.   He has no concerns.  Medications and allergies reviewed with patient and updated if appropriate.  Patient Active Problem List   Diagnosis Date Noted  . Palpitations 03/06/2021  . Cervical radiculopathy at C6 04/14/2020  . Insomnia 12/31/2018  . Diabetes mellitus type 2 07/01/2014  . Eczema 06/29/2013  . Hyperlipidemia 06/29/2013  . Low libido 05/28/2013  . Essential hypertension, benign 05/28/2013    Current Outpatient Medications on File Prior to Visit  Medication Sig Dispense Refill  . atorvastatin (LIPITOR) 20 MG tablet Take 1 tablet (20 mg total) by mouth daily. (Patient taking differently: Take 20 mg by mouth every other day.) 90 tablet 0  . carvedilol (COREG) 6.25 MG tablet Take 1 tablet (6.25 mg total) by mouth 2 (two) times daily. 180 tablet 0  . fish oil-omega-3 fatty acids 1000 MG capsule Take 2 g by mouth daily.    . Lancets (FREESTYLE) lancets Use to check blood sugars. E11.9 100 each 1  . losartan-hydrochlorothiazide (HYZAAR) 100-25 MG tablet Take 1 tablet by mouth daily. 90 tablet 3  . Magnesium 250 MG TABS Take by mouth daily.    . meloxicam (MOBIC) 15 MG tablet TAKE 1 TABLET BY MOUTH EVERY DAY 30 tablet 0  . metFORMIN (GLUCOPHAGE) 1000 MG tablet TAKE 0.5 TABLETS (500 MG TOTAL) BY MOUTH TWICE TIMES DAILY WITH A MEAL. 90 tablet 0  . ONETOUCH VERIO test strip use to check blood sugar once daily 100 each 0  . SHINGRIX injection     . zinc gluconate 50 MG tablet Take 50 mg by mouth daily.    Marland Kitchen zolpidem (AMBIEN) 10 MG tablet Take 1 tablet (10 mg total) by mouth at bedtime as needed for sleep. 30 tablet 1   No current facility-administered medications on file prior to visit.    Past Medical History:  Diagnosis Date  . Cervical radiculopathy at C6 04/14/2020  . Essential hypertension, benign 05/28/2013  . Hyperlipidemia 06/29/2013   . Type 2 diabetes mellitus 07/01/2014    History reviewed. No pertinent surgical history.  Social History   Socioeconomic History  . Marital status: Married    Spouse name: Not on file  . Number of children: Not on file  . Years of education: 73  . Highest education level: High school graduate  Occupational History  . Occupation: Retired  Tobacco Use  . Smoking status: Former Research scientist (life sciences)  . Smokeless tobacco: Never Used  Substance and Sexual Activity  . Alcohol use: Yes    Alcohol/week: 1.0 standard drink    Types: 1 Cans of beer per week  . Drug use: Not Currently  . Sexual activity: Yes  Other Topics Concern  . Not on file  Social History Narrative  . Not on file   Social Determinants of Health   Financial Resource Strain: Not on file  Food Insecurity: Not on file  Transportation Needs: Not on file  Physical Activity: Not on file  Stress: Not on file  Social Connections: Not on file    Family History  Problem Relation Age of Onset  . Breast cancer Mother   . Cancer Mother   . Diabetes Mother   . Heart disease Father   . Hyperlipidemia Father   . Hypertension Father   . Diabetes Brother   . Heart disease Brother  cabg  . Dementia Paternal Grandmother        Possible Alzheimer's disease  . Alcohol abuse Neg Hx   . COPD Neg Hx   . Depression Neg Hx   . Drug abuse Neg Hx   . Early death Neg Hx   . Hearing loss Neg Hx   . Kidney disease Neg Hx   . Stroke Neg Hx     Review of Systems  Constitutional: Negative for chills and fever.  Eyes: Negative for visual disturbance.  Respiratory: Negative for cough, shortness of breath and wheezing.   Cardiovascular: Positive for palpitations (occ, transient). Negative for chest pain and leg swelling.  Gastrointestinal: Negative for abdominal pain, blood in stool, constipation, diarrhea and nausea.       No gerd  Genitourinary: Negative for difficulty urinating, dysuria and hematuria.  Musculoskeletal: Positive  for arthralgias (mild hand stiffness) and neck pain. Negative for back pain.  Skin: Negative for color change and rash.  Neurological: Negative for light-headedness and headaches.  Psychiatric/Behavioral: Negative for dysphoric mood. The patient is not nervous/anxious.        Objective:   Vitals:   04/16/21 1000  BP: 134/78  Pulse: 63  Temp: 98.4 F (36.9 C)  SpO2: 95%   Filed Weights   04/16/21 1000  Weight: 198 lb 3.2 oz (89.9 kg)   Body mass index is 28.44 kg/m.  BP Readings from Last 3 Encounters:  04/16/21 134/78  03/19/21 138/74  11/01/20 140/82    Wt Readings from Last 3 Encounters:  04/16/21 198 lb 3.2 oz (89.9 kg)  03/19/21 198 lb (89.8 kg)  11/01/20 192 lb 9.6 oz (87.4 kg)     Physical Exam Constitutional: He appears well-developed and well-nourished. No distress.  HENT:  Head: Normocephalic and atraumatic.  Right Ear: External ear normal.  Left Ear: External ear normal.  Mouth/Throat: Oropharynx is clear and moist.  Normal ear canals and TM b/l  Eyes: Conjunctivae and EOM are normal.  Neck: Neck supple. No tracheal deviation present. No thyromegaly present.  No carotid bruit  Cardiovascular: Normal rate, regular rhythm, normal heart sounds and intact distal pulses.   No murmur heard. Pulmonary/Chest: Effort normal and breath sounds normal. No respiratory distress. He has no wheezes. He has no rales.  Abdominal: Soft.  Ventral hernia-nontender and reducible.  He exhibits no distension. There is no tenderness.  Genitourinary: deferred  Musculoskeletal: He exhibits no edema.  Lymphadenopathy:   He has no cervical adenopathy.  Skin: Skin is warm and dry. He is not diaphoretic.  Psychiatric: He has a normal mood and affect. His behavior is normal.         Assessment & Plan:   Physical exam: Screening blood work  ordered Immunizations  Advised pneumonia vaccine-he may get this at the pharmacy Colonoscopy   Up to date  Eye exams   Scheduled   Exercise   Yard work, very active, walking Weight  Advised weight loss Substance abuse   none Sees derm annually   See Problem List for Assessment and Plan of chronic medical problems.   This visit occurred during the SARS-CoV-2 public health emergency.  Safety protocols were in place, including screening questions prior to the visit, additional usage of staff PPE, and extensive cleaning of exam room while observing appropriate contact time as indicated for disinfecting solutions.

## 2021-04-16 ENCOUNTER — Other Ambulatory Visit: Payer: Self-pay

## 2021-04-16 ENCOUNTER — Encounter: Payer: Self-pay | Admitting: Internal Medicine

## 2021-04-16 ENCOUNTER — Ambulatory Visit: Payer: Medicare HMO

## 2021-04-16 ENCOUNTER — Ambulatory Visit (INDEPENDENT_AMBULATORY_CARE_PROVIDER_SITE_OTHER): Payer: Medicare HMO | Admitting: Internal Medicine

## 2021-04-16 VITALS — BP 134/78 | HR 63 | Temp 98.4°F | Ht 70.0 in | Wt 198.2 lb

## 2021-04-16 DIAGNOSIS — Z0000 Encounter for general adult medical examination without abnormal findings: Secondary | ICD-10-CM

## 2021-04-16 DIAGNOSIS — K439 Ventral hernia without obstruction or gangrene: Secondary | ICD-10-CM

## 2021-04-16 DIAGNOSIS — E7849 Other hyperlipidemia: Secondary | ICD-10-CM | POA: Diagnosis not present

## 2021-04-16 DIAGNOSIS — E785 Hyperlipidemia, unspecified: Secondary | ICD-10-CM

## 2021-04-16 DIAGNOSIS — E119 Type 2 diabetes mellitus without complications: Secondary | ICD-10-CM | POA: Diagnosis not present

## 2021-04-16 DIAGNOSIS — G4709 Other insomnia: Secondary | ICD-10-CM

## 2021-04-16 DIAGNOSIS — I10 Essential (primary) hypertension: Secondary | ICD-10-CM

## 2021-04-16 DIAGNOSIS — Z125 Encounter for screening for malignant neoplasm of prostate: Secondary | ICD-10-CM | POA: Diagnosis not present

## 2021-04-16 DIAGNOSIS — Z0001 Encounter for general adult medical examination with abnormal findings: Secondary | ICD-10-CM

## 2021-04-16 LAB — CBC WITH DIFFERENTIAL/PLATELET
Basophils Absolute: 0 10*3/uL (ref 0.0–0.1)
Basophils Relative: 0.6 % (ref 0.0–3.0)
Eosinophils Absolute: 0.2 10*3/uL (ref 0.0–0.7)
Eosinophils Relative: 2.7 % (ref 0.0–5.0)
HCT: 45.2 % (ref 39.0–52.0)
Hemoglobin: 15.8 g/dL (ref 13.0–17.0)
Lymphocytes Relative: 31.3 % (ref 12.0–46.0)
Lymphs Abs: 1.9 10*3/uL (ref 0.7–4.0)
MCHC: 34.9 g/dL (ref 30.0–36.0)
MCV: 88.6 fl (ref 78.0–100.0)
Monocytes Absolute: 0.6 10*3/uL (ref 0.1–1.0)
Monocytes Relative: 10.5 % (ref 3.0–12.0)
Neutro Abs: 3.3 10*3/uL (ref 1.4–7.7)
Neutrophils Relative %: 54.9 % (ref 43.0–77.0)
Platelets: 202 10*3/uL (ref 150.0–400.0)
RBC: 5.1 Mil/uL (ref 4.22–5.81)
RDW: 13.5 % (ref 11.5–15.5)
WBC: 5.9 10*3/uL (ref 4.0–10.5)

## 2021-04-16 LAB — TSH: TSH: 1.16 u[IU]/mL (ref 0.35–4.50)

## 2021-04-16 LAB — PSA: PSA: 1.2 ng/mL (ref 0.10–4.00)

## 2021-04-16 NOTE — Assessment & Plan Note (Signed)
Chronic Ventral hernia is small and he has been asymptomatic Discussed that no treatment/surgery is necessary Will monitor

## 2021-04-16 NOTE — Assessment & Plan Note (Signed)
Chronic Lipids well controlled Continue atorvastatin 20 mg daily Regular exercise and healthy diet

## 2021-04-16 NOTE — Assessment & Plan Note (Signed)
Chronic BP well controlled Continue coreg 6.25 mg bid, losartan-hctz 100-25 mg daily

## 2021-04-16 NOTE — Assessment & Plan Note (Signed)
Chronic Lab Results  Component Value Date   HGBA1C 6.8 (H) 03/19/2021   Controlled, but could be lower Continue metformin 500 mg bid Stressed diabetic diet and regular exercise

## 2021-04-16 NOTE — Progress Notes (Deleted)
Chronic Care Management Pharmacy Note  04/16/2021 Name:  Billy Fernandez MRN:  [id number] DOB:  [birth year]  Subjective: Billy Fernandez is an 70 y.o. year old male who is a primary patient of Burns, Claudina Lick, MD.  The CCM team was consulted for assistance with disease management and care coordination needs.    {CCMTELEPHONEFACETOFACE:21091510} for {CCMINITIALFOLLOWUPCHOICE:21091511} in response to provider referral for pharmacy case management and/or care coordination services.   Consent to Services:  {CCMCONSENTOPTIONS:25074}  Patient Care Team: Binnie Rail, MD as PCP - General (Internal Medicine) Charlton Haws, [facility] as Pharmacist (Pharmacist)  Recent office visits: 03/19/21 Burns (PCP) - increased dose of hydrochlorothiazide to 25 mg daily (losartan-hctz 100/25 mg), f/u 6 mo. Referral sent for coronary CT. Cardiology referral for palpitations/fam hx of CAD.  11/01/20 Burns (PCP) - no medication changes, f/u 6 mo.  Recent consult visits: [facility] visits: None in previous 6 months  Objective:  Lab Results  Component Value Date   CREATININE 1.09 03/19/2021   BUN 14 03/19/2021   GFR 69.42 03/19/2021   GFRNONAA 67 12/25/2018   GFRAA 78 12/25/2018   NA 140 03/19/2021   K 4.5 03/19/2021   CALCIUM 10.1 03/19/2021   CO2 33 (H) 03/19/2021   GLUCOSE 117 (H) 03/19/2021    Lab Results  Component Value Date/Time   HGBA1C 6.8 (H) 03/19/2021 10:26 AM   HGBA1C 6.5 10/17/2020 08:04 AM   GFR 69.42 03/19/2021 10:26 AM   GFR 77.19 11/01/2020 10:48 AM   MICROALBUR 0.7 12/27/2016 07:30 AM   MICROALBUR 0.7 12/13/2015 07:22 AM    Last diabetic Eye exam:  Lab Results  Component Value Date/Time   HMDIABEYEEXA No Retinopathy 02/22/2020 09:12 AM    Last diabetic Foot exam: No results found for: HMDIABFOOTEX   Lab Results  Component Value Date   CHOL 111 03/19/2021   HDL 45.90 03/19/2021   LDLCALC 38 03/19/2021   TRIG 133.0 03/19/2021   CHOLHDL 2 03/19/2021     Hepatic Function Latest Ref Rng & Units 03/19/2021 11/01/2020 04/14/2020  Total Protein 6.0 - 8.3 g/dL 7.0 6.8 6.9  Albumin 3.5 - 5.2 g/dL 4.4 4.1 4.4  AST 0 - 37 U/L _0 ALT 0 - 53 U/L _1 Alk Phosphatase 39 - 117 U/L 34(L) 43 37(L)  Total Bilirubin 0.2 - 1.2 mg/dL 1.2 0.7 0.9    Lab Results  Component Value Date/Time   TSH 0.83 04/14/2020 08:51 AM   TSH 1.500 12/25/2018 09:13 AM    CBC Latest Ref Rng & Units 04/14/2020 12/25/2018 12/19/2017  WBC 4.0 - 10.5 K/uL 6.7 7.0 8.0  Hemoglobin 13.0 - 17.0 g/dL 15.8 16.3 15.9  Hematocrit 39.0 - 52.0 % 46.5 47.7 47.1  Platelets 150.0 - 400.0 K/uL 214.0 241 276.0    Lab Results  Component Value Date/Time   VD25OH 45 05/28/2013 03:32 PM    Clinical ASCVD: No  The ASCVD Risk score (Goff DC Jr., et al., 2013) failed to calculate for the following reasons:   The valid total cholesterol range is 130 to 320 mg/dL    Depression screen [facility] 2/9 04/14/2020 12/25/2018 12/19/2017  Decreased Interest 0 0 0  Down, Depressed, Hopeless 0 0 0  PHQ - 2 Score 0 0 0       Social History   Tobacco Use  Smoking Status Former Smoker  Smokeless Tobacco Never Used   BP Readings from Last 3 Encounters:  03/19/21 138/74  11/01/20 140/82  04/14/20 138/82   Pulse Readings from Last 3 Encounters:  03/19/21 (!) 52  11/01/20 64  04/14/20 (!) 55   Wt Readings from Last 3 Encounters:  03/19/21 198 lb (89.8 kg)  11/01/20 192 lb 9.6 oz (87.4 kg)  04/14/20 191 lb (86.6 kg)   BMI Readings from Last 3 Encounters:  03/19/21 28.41 kg/m  11/01/20 27.64 kg/m  04/14/20 27.41 kg/m    Assessment/Interventions: Review of patient past medical history, allergies, medications, health status, including review of consultants reports, laboratory and other test data, was performed as part of comprehensive evaluation and provision of chronic care management services.   SDOH:  (Social Determinants of Health) assessments and interventions performed:  Yes  SDOH Screenings   Alcohol Screen: Not on file  Depression (MKL4-9): Not on file  Financial Resource Strain: Not on file  Food Insecurity: Not on file  Housing: Not on file  Physical Activity: Not on file  Social Connections: Not on file  Stress: Not on file  Tobacco Use: Medium Risk  . Smoking Tobacco Use: Former Smoker  . Smokeless Tobacco Use: Never Used  Transportation Needs: Not on file    CCM Care Plan  No Known Allergies  Medications Reviewed Today    Reviewed by Binnie Rail, MD (Physician) on 03/19/21 at 1006  Med List Status: <None>  Medication Order Taking? Sig Documenting Provider Last Dose Status Informant  atorvastatin (LIPITOR) 20 MG tablet [id number] Yes Take 1 tablet (20 mg total) by mouth daily.  Patient taking differently: Take 20 mg by mouth every other day.   Binnie Rail, MD Taking Active   carvedilol (COREG) 6.25 MG tablet [id number] Yes Take 1 tablet (6.25 mg total) by mouth 2 (two) times daily. Binnie Rail, MD Taking Active   fish oil-omega-3 fatty acids 1000 MG capsule [id number] Yes Take 2 g by mouth daily. [provider] Taking Active   hydrochlorothiazide (HYDRODIURIL) 12.5 MG tablet [id number] Yes Take 1 tablet (12.5 mg total) by mouth daily. Binnie Rail, MD Taking Active   Lancets (FREESTYLE) lancets [id number] Yes Use to check blood sugars. E11.9 Binnie Rail, MD Taking Active   losartan-hydrochlorothiazide [facility]) 100-12.5 MG tablet [id number] Yes TAKE 1 TABLET BY MOUTH EVERY DAY Binnie Rail, MD Taking Active   Magnesium 250 MG TABS [id number] Yes Take by mouth daily. [provider] Taking Active   meloxicam (MOBIC) 15 MG tablet [id number] Yes TAKE 1 TABLET BY MOUTH EVERY DAY Wallene Huh, DPM Taking Active   metFORMIN (GLUCOPHAGE) 1000 MG tablet [id number] Yes TAKE 0.5 TABLETS (500 MG TOTAL) BY MOUTH TWICE TIMES DAILY WITH A MEAL. Binnie Rail, MD Taking Active   [facility] VERIO test strip [id number] Yes use to  check blood sugar once daily Binnie Rail, MD Taking Active   [facility] injection [id number] Yes  [provider] Taking Active   zinc gluconate 50 MG tablet [id number] Yes Take 50 mg by mouth daily. [provider] Taking Active   zolpidem (AMBIEN) 10 MG tablet [id number]  Take 1 tablet (10 mg total) by mouth at bedtime as needed for sleep. Binnie Rail, MD  Expired 01/30/19 2359           Patient Active Problem List   Diagnosis Date Noted  . Palpitations 03/06/2021  . Cervical radiculopathy at C6 04/14/2020  . Insomnia 12/31/2018  . Diabetes mellitus type 2 07/01/2014  . Eczema 06/29/2013  . Hyperlipidemia 06/29/2013  . Low libido 05/28/2013  .  Essential hypertension, benign 05/28/2013    Immunization History  Administered Date(s) Administered  . PFIZER(Purple Top)SARS-COV-2 Vaccination 02/04/2020, 02/27/2020, 10/30/2020  . Td 07/23/2017  . Tdap 05/08/2006    Conditions to be addressed/monitored:  {USCCMDZASSESSMENTOPTIONS:23563}  There are no care plans that you recently modified to display for this patient.    Medication Assistance: {MEDASSISTANCEINFO:25044}  Patient's preferred pharmacy is:  [id number] [place], [place] - [address] [place] [address]Phone: [phone]: [phone] Uses pill box? {Yes or If no, why not?:20788} Pt endorses ***% compliance  We discussed: {Pharmacy options:24294} Patient decided to: {US Pharmacy Plan:23885}  Care Plan and Follow Up Patient Decision:  {FOLLOWUP:24991}  Plan: {CM FOLLOW UP PCCEQ:33744} ***    Current Barriers:  . {pharmacybarriers:24917}  Pharmacist Clinical Goal(s):  .Marland KitchenPatient will {PHARMACYGOALCHOICES:24921} through collaboration with PharmD and provider.   Interventions: . 1:1 collaboration with BBinnie Rail MD regarding development and update of comprehensive plan of care as evidenced by provider attestation and  co-signature . Inter-disciplinary care team collaboration (see longitudinal plan of care) . Comprehensive medication review performed; medication list updated in electronic medical record  Hypertension (BP goal <130/80) -{US controlled/uncontrolled:25276} -Current treatment: . Losartan-HCTZ 100-25 mg daily . Carvedilol 6.25 mg BID -Medications previously tried: ***  -Current home readings: *** -Current dietary habits: *** -Current exercise habits: *** -{ACTIONS;DENIES/REPORTS:21021675::"Denies"} hypotensive/hypertensive symptoms -Educated on {CCM BP Counseling:25124} -Counseled to monitor BP at home ***, document, and provide log at future appointments -{CCMPHARMDINTERVENTION:25122}  Hyperlipidemia: (LDL goal < 100) -{US controlled/uncontrolled:25276}  -statin started 2014, LDL was 64 -Current treatment: . Atorvastatin 20 mg daily . Omega-3 Fish Oil 2 g daily -Medications previously tried: ***  -Current dietary patterns: *** -Current exercise habits: *** -Educated on {CCM HLD Counseling:25126} -{CCMPHARMDINTERVENTION:25122}  Diabetes (A1c goal <7%) -{US controlled/uncontrolled:25276}  -A1c peak 8.3 11/23/2015 (when metformin was started) -Current medications: .Marland KitchenMetformin 1000 mg - 1/2 tab BID . Testing supplies -Medications previously tried: ***  -Current home glucose readings . fasting glucose: *** . post prandial glucose: *** -{ACTIONS;DENIES/REPORTS:21021675::"Denies"} hypoglycemic/hyperglycemic symptoms -Current meal patterns:  . breakfast: ***  . lunch: ***  . dinner: *** . snacks: *** . drinks: *** -Current exercise: *** -Educated on {CCM DM COUNSELING:25123} -Counseled to check feet daily and get yearly eye exams -{CCMPHARMDINTERVENTION:25122}  Pain (Goal: ***) -cervical radiculopathy -{US controlled/uncontrolled:25276} -Current treatment  . Meloxicam 15 mg daily -Medications previously tried: ***  -{CCMPHARMDINTERVENTION:25122}  Insomnia (Goal:  ***) -{US controlled/uncontrolled:25276} -Current treatment  . Zolpidem 10 mg HS prn -Medications previously tried: ***  -{CCMPHARMDINTERVENTION:25122}  Health Maintenance -Vaccine gaps: PCV20? -Current therapy:  . Magnesium 250 mg daily . Zinc 50 mg daily -Educated on {ccm supplement counseling:25128} -{CCM Patient satisfied:25129} -{CCMPHARMDINTERVENTION:25122}  Patient Goals/Self-Care Activities . Patient will:  - {pharmacypatientgoals:24919}  Follow Up Plan: {CM FOLLOW UP PZHQU:04799}

## 2021-04-16 NOTE — Assessment & Plan Note (Addendum)
Chronic Rarely Intermittently has taken Ambien-okay to continue

## 2021-04-19 ENCOUNTER — Ambulatory Visit (INDEPENDENT_AMBULATORY_CARE_PROVIDER_SITE_OTHER)
Admission: RE | Admit: 2021-04-19 | Discharge: 2021-04-19 | Disposition: A | Discharge status: Home / Self Care | Payer: Self-pay | Source: Ambulatory Visit | Attending: Internal Medicine | Admitting: Internal Medicine

## 2021-04-19 ENCOUNTER — Other Ambulatory Visit: Payer: Self-pay

## 2021-04-19 ENCOUNTER — Encounter: Payer: Self-pay | Admitting: Internal Medicine

## 2021-04-19 DIAGNOSIS — E7849 Other hyperlipidemia: Secondary | ICD-10-CM

## 2021-04-19 DIAGNOSIS — R931 Abnormal findings on diagnostic imaging of heart and coronary circulation: Secondary | ICD-10-CM | POA: Insufficient documentation

## 2021-04-19 DIAGNOSIS — I10 Essential (primary) hypertension: Secondary | ICD-10-CM | POA: Diagnosis not present

## 2021-04-19 DIAGNOSIS — Z8249 Family history of ischemic heart disease and other diseases of the circulatory system: Secondary | ICD-10-CM

## 2021-04-19 DIAGNOSIS — E119 Type 2 diabetes mellitus without complications: Secondary | ICD-10-CM

## 2021-04-19 NOTE — Progress Notes (Signed)
Coronary calcium score 04/19/2021: Left main: 0 Left anterior descending artery: 129 Left circumflex artery: 0 Right coronary artery: 0  Total coronary calcium score of 129. This was 62 percentile for age-,race-, and sex-matched controls  Pericardium: Normal. Ascending Aorta: Normal caliber. Marland Kitchen

## 2021-04-20 NOTE — Progress Notes (Signed)
    Chronic Care Management Pharmacy Assistant   Name: Billy Fernandez  MRN: [id number] DOB: [birth year]    Reason for Encounter: Chart Review    Medications: Outpatient Encounter Medications as of 04/13/2021  Medication Sig  . atorvastatin (LIPITOR) 20 MG tablet Take 1 tablet (20 mg total) by mouth daily. (Patient taking differently: Take 20 mg by mouth every other day.)  . carvedilol (COREG) 6.25 MG tablet Take 1 tablet (6.25 mg total) by mouth 2 (two) times daily.  . fish oil-omega-3 fatty acids 1000 MG capsule Take 2 g by mouth daily.  . Lancets (FREESTYLE) lancets Use to check blood sugars. E11.9  . losartan-hydrochlorothiazide (HYZAAR) 100-25 MG tablet Take 1 tablet by mouth daily.  . Magnesium 250 MG TABS Take by mouth daily.  . meloxicam (MOBIC) 15 MG tablet TAKE 1 TABLET BY MOUTH EVERY DAY  . metFORMIN (GLUCOPHAGE) 1000 MG tablet TAKE 0.5 TABLETS (500 MG TOTAL) BY MOUTH TWICE TIMES DAILY WITH A MEAL.  Marland Kitchen ONETOUCH VERIO test strip use to check blood sugar once daily  . SHINGRIX injection   . zinc gluconate 50 MG tablet Take 50 mg by mouth daily.  Marland Kitchen zolpidem (AMBIEN) 10 MG tablet Take 1 tablet (10 mg total) by mouth at bedtime as needed for sleep.   No facility-administered encounter medications on file as of 04/13/2021.    Reviewed chart for medication changes and adherence.   No gaps in adherence identified. Patient has follow up scheduled with pharmacy team. No further action required.   [place] Pharmacist Assistant [phone]  Time spent:4

## 2021-05-04 DIAGNOSIS — H524 Presbyopia: Secondary | ICD-10-CM | POA: Diagnosis not present

## 2021-05-04 DIAGNOSIS — E119 Type 2 diabetes mellitus without complications: Secondary | ICD-10-CM | POA: Diagnosis not present

## 2021-05-07 ENCOUNTER — Other Ambulatory Visit: Payer: Self-pay

## 2021-05-07 ENCOUNTER — Encounter: Payer: Self-pay | Admitting: Cardiology

## 2021-05-07 ENCOUNTER — Ambulatory Visit: Payer: Medicare HMO | Admitting: Cardiology

## 2021-05-07 VITALS — BP 123/76 | HR 67 | Temp 97.3°F | Resp 17 | Ht 70.0 in | Wt 202.6 lb

## 2021-05-07 DIAGNOSIS — R002 Palpitations: Secondary | ICD-10-CM | POA: Diagnosis not present

## 2021-05-07 DIAGNOSIS — R931 Abnormal findings on diagnostic imaging of heart and coronary circulation: Secondary | ICD-10-CM | POA: Diagnosis not present

## 2021-05-07 DIAGNOSIS — E7800 Pure hypercholesterolemia, unspecified: Secondary | ICD-10-CM | POA: Diagnosis not present

## 2021-05-07 DIAGNOSIS — I10 Essential (primary) hypertension: Secondary | ICD-10-CM

## 2021-05-07 NOTE — Progress Notes (Signed)
Primary Physician/Referring:  Binnie Rail, MD  Patient ID: Billy Fernandez, male    DOB: 07/08/1951, 70 y.o.   MRN: [id number]  Chief Complaint  Patient presents with  . New Patient (Initial Visit)  . PALPS    SELF REF   HPI:    Billy Fernandez  is a 70 y.o. Caucasian male patient with hypertension, hyperlipidemia and type 2 diabetes mellitus, diet controlled and chronic palpitations, I had last seen him in 2017.  He continues to remain active and except for palpitations that occur during rest, when he wakes up in the morning, or when he is watching TV.  This is chronic, however symptoms had been stable until about 3 to 4 months ago.  He had recurrence of the symptoms.   He denies symptoms with activity.  Although does not have a routine exercise program, tends to remain active with setting up camping and also yard work without any other symptoms.  He is accompanied by his wife.  He is also wondering whether he is on appropriate medical therapy and wants to discuss coronary calcium score that was recently performed.  Past Medical History:  Diagnosis Date  . Cervical radiculopathy at C6 04/14/2020  . Essential hypertension, benign 05/28/2013  . Hyperlipidemia 06/29/2013  . Type 2 diabetes mellitus 07/01/2014   History reviewed. No pertinent surgical history. Family History  Problem Relation Age of Onset  . Breast cancer Mother   . Cancer Mother   . Diabetes Mother   . Heart disease Father   . Hyperlipidemia Father   . Hypertension Father   . Diabetes Brother   . Heart disease Brother        cabg  . Dementia Paternal Grandmother        Possible Alzheimer's disease  . Drug abuse Sister   . Alcohol abuse Neg Hx   . COPD Neg Hx   . Depression Neg Hx   . Early death Neg Hx   . Hearing loss Neg Hx   . Kidney disease Neg Hx   . Stroke Neg Hx     Social History   Tobacco Use  . Smoking status: Former Smoker    Years: 25.00    Types: Cigarettes    Quit date: 05/07/1981     Years since quitting: 40.0  . Smokeless tobacco: Never Used  Substance Use Topics  . Alcohol use: Yes    Alcohol/week: 1.0 standard drink    Types: 1 Cans of beer per week    Comment: OCC   Marital Status: Married  ROS  Review of Systems  Cardiovascular: Positive for palpitations. Negative for chest pain, dyspnea on exertion and leg swelling.  Gastrointestinal: Negative for melena.   Objective  Blood pressure 123/76, pulse 67, temperature (!) 97.3 F (36.3 C), temperature source Temporal, resp. rate 17, height 5\' 10"  (1.778 m), weight 202 lb 9.6 oz (91.9 kg), SpO2 95 %. Body mass index is 29.07 kg/m.  Vitals with BMI 05/07/2021 04/16/2021 03/19/2021  Height 5\' 10"  5\' 10"  5\' 10"   Weight 202 lbs 10 oz 198 lbs 3 oz 198 lbs  BMI 29.07 26.83 41.96  Systolic 222 979 892  Diastolic 76 78 74  Pulse 67 63 52     Physical Exam  Constitutional: No distress.  Overweight with a BMI of 29  Eyes: Conjunctivae are normal.  Neck: No JVD present.  Cardiovascular: Regular rhythm, normal heart sounds, intact distal pulses and normal pulses. Exam reveals no gallop.  No  murmur heard. Pulmonary/Chest: Effort normal and breath sounds normal. He exhibits no tenderness.  Abdominal: Soft. Bowel sounds are normal.  Musculoskeletal:        General: No edema. Normal range of motion.     Cervical back: Neck supple.  Neurological: He is alert and oriented to person, place, and time.  Skin: Skin is warm.     Laboratory examination:   Recent Labs    11/01/20 1048 03/19/21 1026  NA 139 140  K 4.8 4.5  CL 102 101  CO2 33* 33*  GLUCOSE 110* 117*  BUN 18 14  CREATININE 1.00 1.09  CALCIUM 9.9 10.1   CrCl cannot be calculated (Patient's most recent lab result is older than the maximum 21 days allowed.).  CMP Latest Ref Rng & Units 03/19/2021 11/01/2020 04/14/2020  Glucose 70 - 99 mg/dL 117(H) 110(H) 90  BUN 6 - 23 mg/dL 14 18 22   Creatinine 0.40 - 1.50 mg/dL 1.09 1.00 1.01  Sodium 135 - 145  mEq/L 140 139 141  Potassium 3.5 - 5.1 mEq/L 4.5 4.8 4.4  Chloride 96 - 112 mEq/L 101 102 104  CO2 19 - 32 mEq/L 33(H) 33(H) 30  Calcium 8.4 - 10.5 mg/dL 10.1 9.9 9.6  Total Protein 6.0 - 8.3 g/dL 7.0 6.8 6.9  Total Bilirubin 0.2 - 1.2 mg/dL 1.2 0.7 0.9  Alkaline Phos 39 - 117 U/L 34(L) 43 37(L)  AST 0 - 37 U/L 20 26 19   ALT 0 - 53 U/L 19 25 19    CBC Latest Ref Rng & Units 04/16/2021 04/14/2020 12/25/2018  WBC 4.0 - 10.5 K/uL 5.9 6.7 7.0  Hemoglobin 13.0 - 17.0 g/dL 15.8 15.8 16.3  Hematocrit 39.0 - 52.0 % 45.2 46.5 47.7  Platelets 150.0 - 400.0 K/uL 202.0 214.0 241    Lipid Panel Recent Labs    11/01/20 1048 03/19/21 1026  CHOL 132 111  TRIG 194.0* 133.0  LDLCALC 46 38  VLDL 38.8 26.6  HDL 48.00 45.90  CHOLHDL 3 2   Lipid Panel     Component Value Date/Time   CHOL 111 03/19/2021 1026   CHOL 132 12/25/2018 0913   TRIG 133.0 03/19/2021 1026   HDL 45.90 03/19/2021 1026   HDL 49 12/25/2018 0913   CHOLHDL 2 03/19/2021 1026   VLDL 26.6 03/19/2021 1026   LDLCALC 38 03/19/2021 1026   LDLCALC 58 12/25/2018 0913   LABVLDL 25 12/25/2018 0913     HEMOGLOBIN A1C Lab Results  Component Value Date   HGBA1C 6.8 (H) 03/19/2021   TSH Recent Labs    04/16/21 1054  TSH 1.16   Medications and allergies  No Known Allergies   Current Outpatient Medications on File Prior to Visit  Medication Sig Dispense Refill  . aspirin EC 81 MG tablet Take 81 mg by mouth daily. Swallow whole.    Marland Kitchen atorvastatin (LIPITOR) 20 MG tablet Take 1 tablet (20 mg total) by mouth daily. (Patient taking differently: Take 20 mg by mouth every other day.) 90 tablet 0  . carvedilol (COREG) 6.25 MG tablet Take 1 tablet (6.25 mg total) by mouth 2 (two) times daily. 180 tablet 0  . CINNAMON PO Take 1,000 mg by mouth daily.    . fish oil-omega-3 fatty acids 1000 MG capsule Take 2 g by mouth daily.    . Lancets (FREESTYLE) lancets Use to check blood sugars. E11.9 100 each 1  . losartan-hydrochlorothiazide  (HYZAAR) 100-25 MG tablet Take 1 tablet by mouth daily.    Marland Kitchen  Magnesium 250 MG TABS Take by mouth daily.    . metFORMIN (GLUCOPHAGE) 1000 MG tablet TAKE 0.5 TABLETS (500 MG TOTAL) BY MOUTH TWICE TIMES DAILY WITH A MEAL. 90 tablet 0  . ONETOUCH VERIO test strip use to check blood sugar once daily 100 each 0  . zinc gluconate 50 MG tablet Take 50 mg by mouth daily.     No current facility-administered medications on file prior to visit.    Radiology:   Coronary calcium score 04/19/2021: Coronary calcium score of 129. This was 70 percentile for age-, race-, and sex-matched controls. LM: 0 LAD: 129 LCx: 0 RCA: 0. Visualized cardiac and noncardiac structures within normal limits.  Cardiac Studies:   Outside echo 08/31/10: Hyperdynamic LV. Mild RV dilatation, normal systolic function.  EKG:    EKG 05/07/2021: Normal sinus rhythm at rate of 65 bpm, normal axis.  No evidence of ischemia, normal EKG.     Assessment     ICD-10-CM   1. Palpitations  R00.2   2. Agatston coronary artery calcium score between 100 and 199 - 129  R93.1   3. Pure hypercholesterolemia  E78.00   4. Essential hypertension, benign  I10 EKG 12-Lead     Medications Discontinued During This Encounter  Medication Reason  . meloxicam (MOBIC) 15 MG tablet Error  . SHINGRIX injection Error  . zolpidem (AMBIEN) 10 MG tablet Error  . losartan-hydrochlorothiazide (HYZAAR) 100-25 MG tablet Error    No orders of the defined types were placed in this encounter.  Orders Placed This Encounter  Procedures  . EKG 12-Lead   Recommendations:   Ausencio Vaden is a 70 y.o.  Caucasian male patient with hypertension, hyperlipidemia and type 2 diabetes mellitus, diet controlled and chronic palpitations, I had last seen him in 2017.  He continues to remain active and except for palpitations that occur during rest, when he wakes up in the morning, or when he is watching TV.  This is chronic, however symptoms had been stable  until about 3 to 4 months ago.  He had recurrence of the symptoms.  I reviewed his labs, he is an excellent and guideline directed medical therapy.  In view of diabetes mellitus being aggressive with primary prevention discussed with the patient and coronary calcium score discussed as well.  He is right in the middle for his age with regard to percentile coronary calcium.  He is at average risk however in view of diabetes mellitus, could be considered high risk and hence appropriately so he is on a statin therapy and LDL is at goal.  Blood pressure is also well controlled since recent change in his antihypertensive medications.  With regard to palpitations, he could certainly use carvedilol and extra dose if symptoms are worse.  I am not concerned about his palpitations as they are occurring only during rest and he continues to remain active without any other symptoms of chest pain or dyspnea.  Advised him that he should incorporate 30 to 40 minutes of aerobic activity on a daily basis.  I will see him back on a as needed basis.    Adrian Prows, MD, [facility] 05/07/2021, 2:38 PM Office: [phone]

## 2021-05-09 DIAGNOSIS — Z0100 Encounter for examination of eyes and vision without abnormal findings: Secondary | ICD-10-CM | POA: Diagnosis not present

## 2021-05-31 ENCOUNTER — Other Ambulatory Visit: Payer: Self-pay | Admitting: Internal Medicine

## 2021-07-03 DIAGNOSIS — L820 Inflamed seborrheic keratosis: Secondary | ICD-10-CM | POA: Diagnosis not present

## 2021-07-03 DIAGNOSIS — L570 Actinic keratosis: Secondary | ICD-10-CM | POA: Diagnosis not present

## 2021-07-03 DIAGNOSIS — L821 Other seborrheic keratosis: Secondary | ICD-10-CM | POA: Diagnosis not present

## 2021-07-03 DIAGNOSIS — L814 Other melanin hyperpigmentation: Secondary | ICD-10-CM | POA: Diagnosis not present

## 2021-07-03 DIAGNOSIS — L578 Other skin changes due to chronic exposure to nonionizing radiation: Secondary | ICD-10-CM | POA: Diagnosis not present

## 2021-07-03 DIAGNOSIS — X32XXXS Exposure to sunlight, sequela: Secondary | ICD-10-CM | POA: Diagnosis not present

## 2021-07-10 DIAGNOSIS — M1711 Unilateral primary osteoarthritis, right knee: Secondary | ICD-10-CM | POA: Diagnosis not present

## 2021-07-10 DIAGNOSIS — M25561 Pain in right knee: Secondary | ICD-10-CM | POA: Diagnosis not present

## 2021-07-12 ENCOUNTER — Encounter: Payer: Self-pay | Admitting: Internal Medicine

## 2021-07-20 ENCOUNTER — Other Ambulatory Visit: Payer: Self-pay | Admitting: Internal Medicine

## 2021-08-03 ENCOUNTER — Other Ambulatory Visit: Payer: Self-pay | Admitting: Internal Medicine

## 2021-08-26 ENCOUNTER — Other Ambulatory Visit: Payer: Self-pay | Admitting: Internal Medicine

## 2021-08-26 NOTE — Telephone Encounter (Signed)
Refill of #90 sent in earlier. Patient should still have medication left

## 2021-09-11 ENCOUNTER — Encounter: Payer: Self-pay | Admitting: Internal Medicine

## 2021-09-16 ENCOUNTER — Other Ambulatory Visit: Payer: Self-pay | Admitting: Internal Medicine

## 2021-09-24 IMAGING — CT CT CARDIAC CORONARY ARTERY CALCIUM SCORE
3 series · 14 of 20 positions shown, 15 images · non-contrast
Comparison: None.
COMPARISON: None.

Addendum:
EXAM:
OVER-READ INTERPRETATION  CT CHEST

The following report is an over-read performed by radiologist Dr.
Ademaro Nina [REDACTED] on 04/19/2021. This
over-read does not include interpretation of cardiac or coronary
anatomy or pathology. The coronary calcium score interpretation by
the cardiologist is attached.
CLINICAL DATA: Cardiovascular Disease Risk stratification
Coronary Calcium Score
TECHNIQUE: A gated, non-contrast computed tomography scan of the heart was
performed using 3mm slice thickness. Axial images were analyzed on a
dedicated workstation. Calcium scoring of the coronary arteries was
performed using the Agatston method.

[Series 2: casc 3.0 bv41 2 bestdiast 72 % · axial · 0.45mm/px · z∈[+1419,+1509]mm · 4 of 52 slices shown, 5 images]
[im 11/52  vessel]
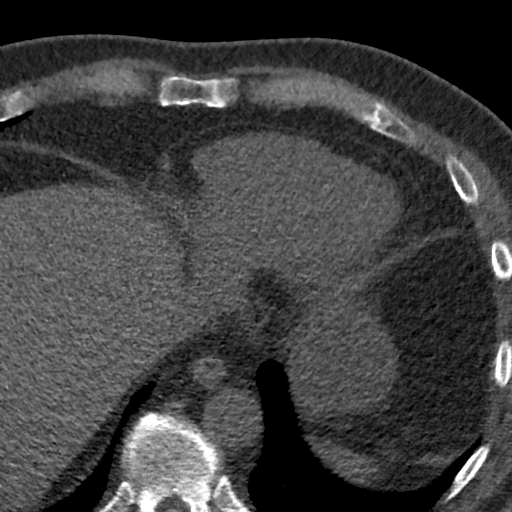
[im 11/52  lung]
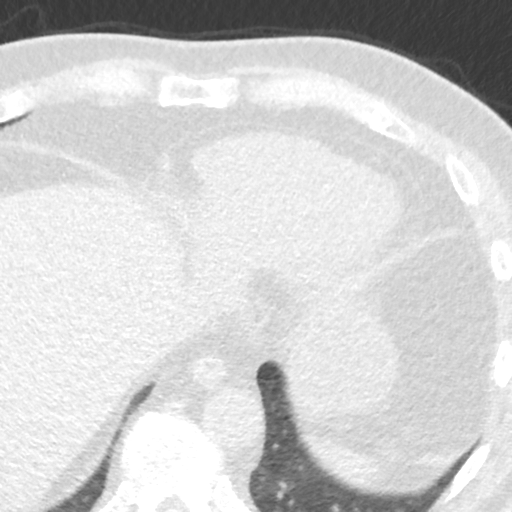
[im 21/52  vessel]
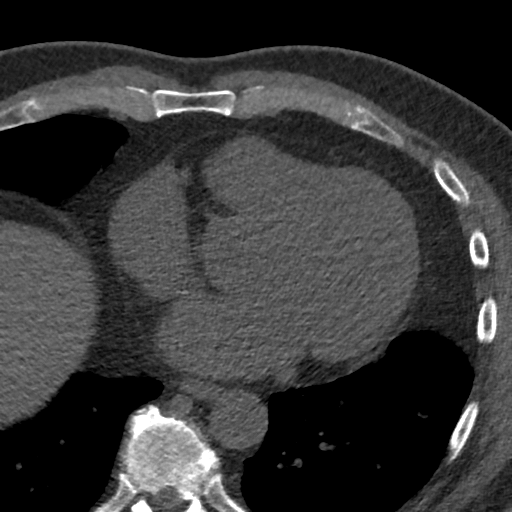
[im 31/52  vessel]
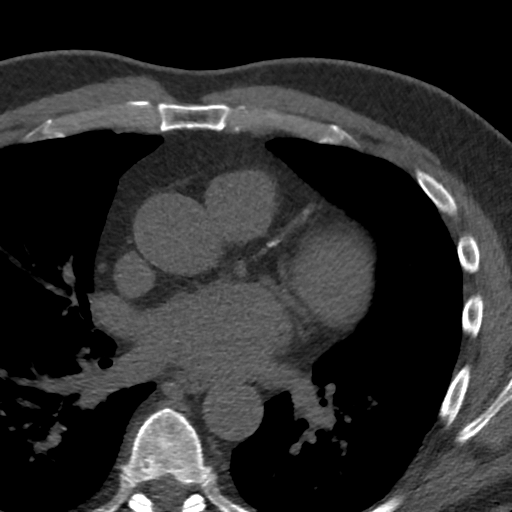
[im 41/52  vessel]
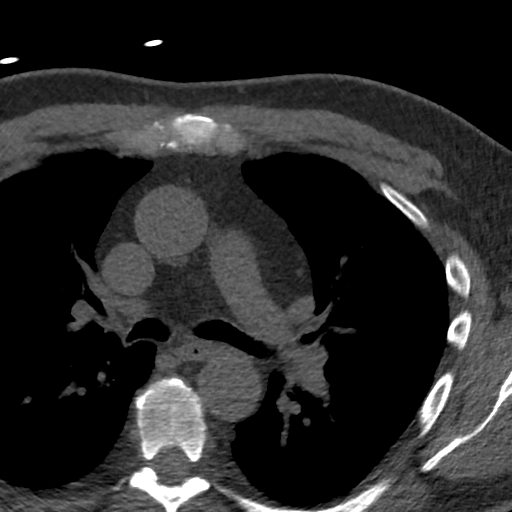

[Series 3: lung 72 % · axial · 0.73mm/px · z∈[+1413,+1515]mm · 5 of 52 slices shown]
[im 9/52  lung]
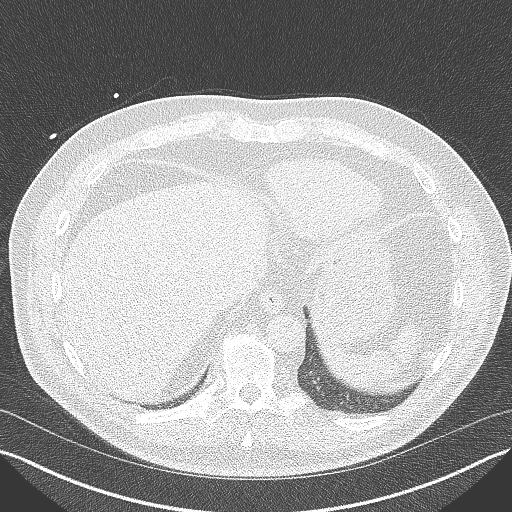
[im 18/52  lung]
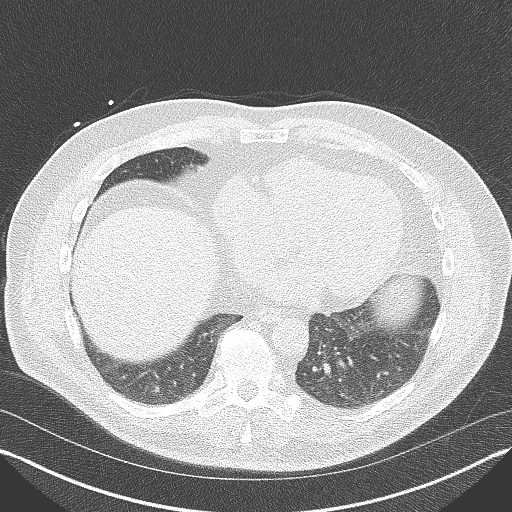
[im 26/52  lung]
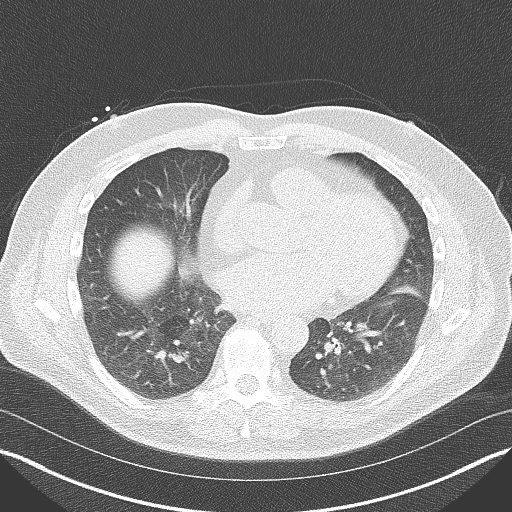
[im 35/52  lung]
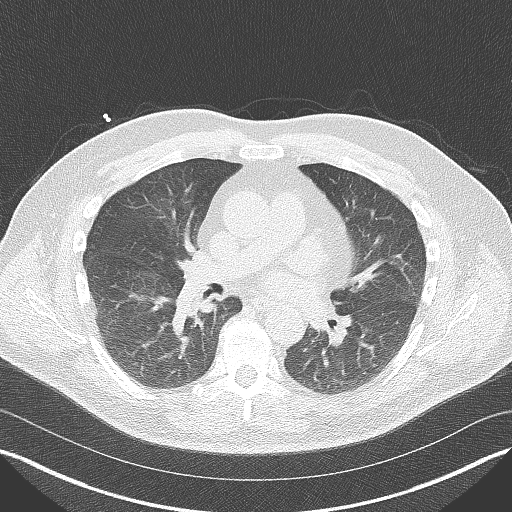
[im 43/52  lung]
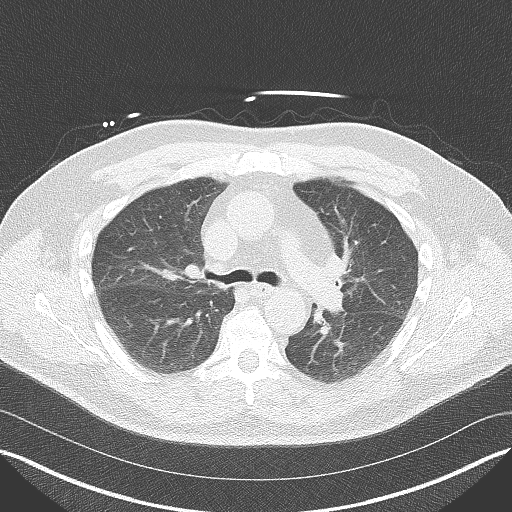

[Series 4: lung st 72 % · axial · 0.73mm/px · z∈[+1413,+1515]mm · 5 of 52 slices shown]
[im 9/52  lung]
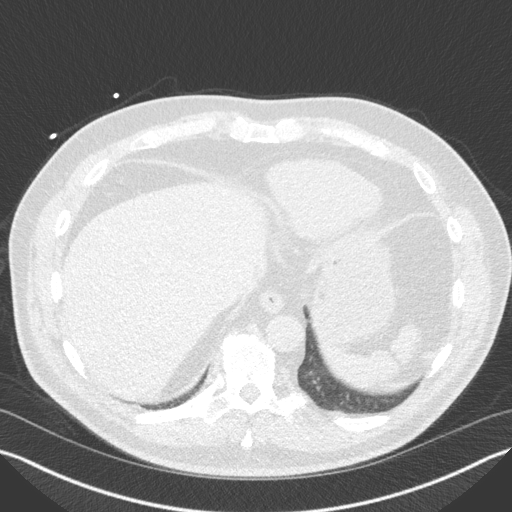
[im 18/52  lung]
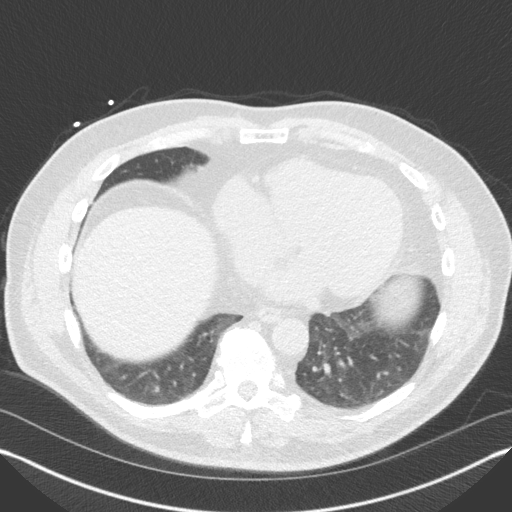
[im 26/52  lung]
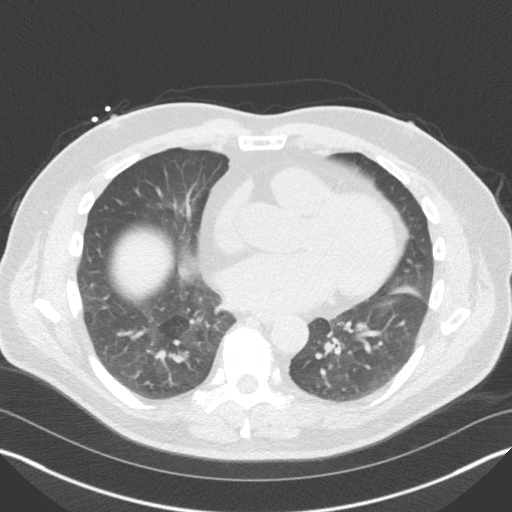
[im 35/52  lung]
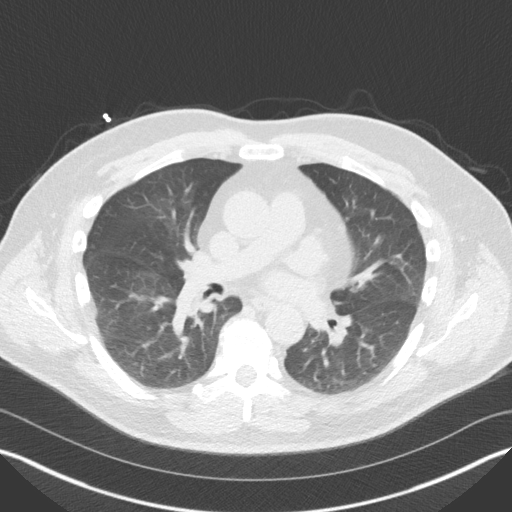
[im 43/52  lung]
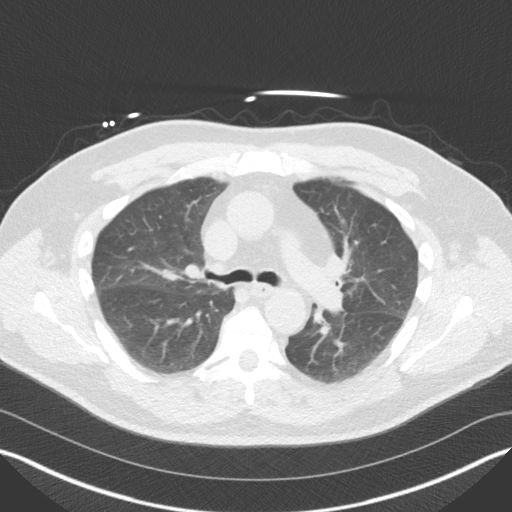

[14 of 20 positions shown; findings below may reference images not displayed]

FINDINGS: Atherosclerotic calcifications in the thoracic aorta. 2 mm pulmonary
nodule in the right middle lobe (axial image 25 of series 3. Within
the visualized portions of the thorax there are no other larger more
suspicious appearing pulmonary nodules or masses, there is no acute
consolidative airspace disease, no pleural effusions, no
pneumothorax and no lymphadenopathy. Visualized portions of the
upper abdomen are unremarkable. There are no aggressive appearing
lytic or blastic lesions noted in the visualized portions of the
skeleton.
IMPRESSION: 1. 2 mm right middle lobe pulmonary nodule, nonspecific, but
statistically likely benign. No follow-up needed if patient is
low-risk. Non-contrast chest CT can be considered in 12 months if
patient is high-risk. This recommendation follows the consensus
statement: Guidelines for Management of Incidental Pulmonary Nodules
Detected on CT Images: From the [HOSPITAL] 4631; Radiology
2.  Aortic Atherosclerosis (AL87C-R67.7).
FINDINGS: Coronary Calcium Score:

Left main: 0

Left anterior descending artery: 129

Left circumflex artery: 0

Right coronary artery: 0

Total: 129

Percentile: 49

Pericardium: Normal.

Ascending Aorta: Normal caliber.

Non-cardiac: See separate report from [REDACTED].
IMPRESSION: Coronary calcium score of 129. This was 49 percentile for age-,
race-, and sex-matched controls.



If CAC=0, it is reasonable to withhold statin therapy and reassess
in 5 to 10 years, as long as higher risk conditions are absent
(diabetes mellitus, family history of premature CHD in first degree
relatives (males <55 years; females <65 years), cigarette smoking,
or LDL >=190 mg/dL).

If CAC is 1 to 99, it is reasonable to initiate statin therapy for
patients >=55 years of age.

If CAC is >=100 or >=75th percentile, it is reasonable to initiate
statin therapy at any age.

Cardiology referral should be considered for patients with CAC
scores >=400 or >=75th percentile.

*3692 AHA/ACC/AACVPR/AAPA/ABC/MARCONI/SEGUIN/ROUSSEL/Kofuji/WEIMANN/KOSCH/JUMPER
Guideline on the Management of Blood Cholesterol: A Report of the
American College of Cardiology/American Heart Association Task Force
on Clinical Practice Guidelines. J Am Coll Cardiol.
5535;73(24):5634-5690.

*** End of Addendum ***
EXAM:
OVER-READ INTERPRETATION  CT CHEST

The following report is an over-read performed by radiologist Dr.
Ademaro Nina [REDACTED] on 04/19/2021. This
over-read does not include interpretation of cardiac or coronary
anatomy or pathology. The coronary calcium score interpretation by
the cardiologist is attached.
FINDINGS: Atherosclerotic calcifications in the thoracic aorta. 2 mm pulmonary
nodule in the right middle lobe (axial image 25 of series 3. Within
the visualized portions of the thorax there are no other larger more
suspicious appearing pulmonary nodules or masses, there is no acute
consolidative airspace disease, no pleural effusions, no
pneumothorax and no lymphadenopathy. Visualized portions of the
upper abdomen are unremarkable. There are no aggressive appearing
lytic or blastic lesions noted in the visualized portions of the
skeleton.
IMPRESSION: 1. 2 mm right middle lobe pulmonary nodule, nonspecific, but
statistically likely benign. No follow-up needed if patient is
low-risk. Non-contrast chest CT can be considered in 12 months if
patient is high-risk. This recommendation follows the consensus
statement: Guidelines for Management of Incidental Pulmonary Nodules
Detected on CT Images: From the [HOSPITAL] 4631; Radiology
2.  Aortic Atherosclerosis (AL87C-R67.7).

## 2021-10-12 ENCOUNTER — Other Ambulatory Visit: Payer: Self-pay | Admitting: Internal Medicine

## 2021-10-12 ENCOUNTER — Other Ambulatory Visit: Payer: Self-pay

## 2021-10-15 ENCOUNTER — Other Ambulatory Visit: Payer: Self-pay | Admitting: Internal Medicine

## 2021-10-15 ENCOUNTER — Encounter: Payer: Self-pay | Admitting: Internal Medicine

## 2021-10-17 ENCOUNTER — Other Ambulatory Visit: Payer: Self-pay

## 2021-10-17 MED ORDER — LOSARTAN POTASSIUM-HCTZ 100-25 MG PO TABS: 1.0000 | ORAL_TABLET | Freq: Every day | ORAL | 2 refills | Status: DC

## 2021-10-17 MED ORDER — ATORVASTATIN CALCIUM 20 MG PO TABS: 20.0000 mg | ORAL_TABLET | Freq: Every day | ORAL | 0 refills | Status: DC

## 2021-10-17 MED ORDER — CARVEDILOL 6.25 MG PO TABS: 6.2500 mg | ORAL_TABLET | Freq: Two times a day (BID) | ORAL | 0 refills | Status: DC

## 2021-10-17 MED ORDER — METFORMIN HCL 1000 MG PO TABS: ORAL_TABLET | ORAL | 2 refills | Status: DC

## 2021-10-21 NOTE — Patient Instructions (Addendum)
    Blood work was ordered.      Medications changes include :   none     Please followup in 6 months  

## 2021-10-21 NOTE — Progress Notes (Signed)
Subjective:    Patient ID: Billy Fernandez, male    DOB: 1951-06-21, 70 y.o.   MRN: [id number]  This visit occurred during the SARS-CoV-2 public health emergency.  Safety protocols were in place, including screening questions prior to the visit, additional usage of staff PPE, and extensive cleaning of exam room while observing appropriate contact time as indicated for disinfecting solutions.     HPI The patient is here for follow up of their chronic medical problems, including htn, hld, DM  Highest sugar 136 when he was not good.    He is walking some.  He is taking his medication daily as prescribed.  He overall feels well and has no concerns.  Medications and allergies reviewed with patient and updated if appropriate.  Patient Active Problem List   Diagnosis Date Noted   Agatston coronary artery calcium score between 100 and 199 - 129 04/19/2021   Ventral hernia without obstruction or gangrene 04/16/2021   Palpitations 03/06/2021   Cervical radiculopathy at C6 04/14/2020   Insomnia 12/31/2018   Diabetes mellitus type 2 07/01/2014   Eczema 06/29/2013   Hyperlipidemia 06/29/2013   Low libido 05/28/2013   Essential hypertension, benign 05/28/2013    Current Outpatient Medications on File Prior to Visit  Medication Sig Dispense Refill   aspirin EC 81 MG tablet Take 81 mg by mouth daily. Swallow whole.     atorvastatin (LIPITOR) 20 MG tablet Take 1 tablet (20 mg total) by mouth daily. 90 tablet 0   carvedilol (COREG) 6.25 MG tablet Take 1 tablet (6.25 mg total) by mouth 2 (two) times daily. 180 tablet 0   CINNAMON PO Take 1,000 mg by mouth daily.     fish oil-omega-3 fatty acids 1000 MG capsule Take 2 g by mouth daily.     Lancets (FREESTYLE) lancets Use to check blood sugars. E11.9 100 each 1   losartan-hydrochlorothiazide (HYZAAR) 100-25 MG tablet Take 1 tablet by mouth daily. 90 tablet 2   Magnesium 250 MG TABS Take by mouth daily.     metFORMIN (GLUCOPHAGE) 1000 MG  tablet TAKE 0.5 TABLET (500 MG TOTAL) BY MOUTH TWICE TIMES DAILY WITH A MEAL. 90 tablet 2   ONETOUCH VERIO test strip use to check blood sugar once daily 100 each 0   PREVNAR 20 0.5 ML injection      zinc gluconate 50 MG tablet Take 50 mg by mouth daily.     No current facility-administered medications on file prior to visit.    Past Medical History:  Diagnosis Date   Cervical radiculopathy at C6 04/14/2020   Essential hypertension, benign 05/28/2013   Hyperlipidemia 06/29/2013   Type 2 diabetes mellitus 07/01/2014    History reviewed. No pertinent surgical history.  Social History   Socioeconomic History   Marital status: Married    Spouse name: Not on file   Number of children: 2   Years of education: 12   Highest education level: High school graduate  Occupational History   Occupation: Retired  Tobacco Use   Smoking status: Former    Years: 25.00    Types: Cigarettes    Quit date: 05/07/1981    Years since quitting: 40.4   Smokeless tobacco: Never  Vaping Use   Vaping Use: Never used  Substance and Sexual Activity   Alcohol use: Yes    Alcohol/week: 1.0 standard drink    Types: 1 Cans of beer per week    Comment: OCC   Drug use:  Not Currently   Sexual activity: Yes  Other Topics Concern   Not on file  Social History Narrative   Not on file   Social Determinants of Health   Financial Resource Strain: Not on file  Food Insecurity: Not on file  Transportation Needs: Not on file  Physical Activity: Not on file  Stress: Not on file  Social Connections: Not on file    Family History  Problem Relation Age of Onset   Breast cancer Mother    Cancer Mother    Diabetes Mother    Heart disease Father    Hyperlipidemia Father    Hypertension Father    Diabetes Brother    Heart disease Brother        cabg   Dementia Paternal Grandmother        Possible Alzheimer's disease   Drug abuse Sister    Alcohol abuse Neg Hx    COPD Neg Hx    Depression Neg Hx     Early death Neg Hx    Hearing loss Neg Hx    Kidney disease Neg Hx    Stroke Neg Hx     Review of Systems  Constitutional:  Negative for fever.  Respiratory:  Negative for cough, shortness of breath and wheezing.   Cardiovascular:  Negative for chest pain, palpitations and leg swelling.  Neurological:  Negative for light-headedness and headaches.      Objective:   Vitals:   10/23/21 0923  BP: 120/74  Pulse: 60  Temp: 98.5 F (36.9 C)  SpO2: 95%   BP Readings from Last 3 Encounters:  10/23/21 120/74  05/07/21 123/76  04/16/21 134/78   Wt Readings from Last 3 Encounters:  10/23/21 196 lb (88.9 kg)  05/07/21 202 lb 9.6 oz (91.9 kg)  04/16/21 198 lb 3.2 oz (89.9 kg)   Body mass index is 28.12 kg/m.   Physical Exam    Constitutional: Appears well-developed and well-nourished. No distress.  HENT:  Head: Normocephalic and atraumatic.  Neck: Neck supple. No tracheal deviation present. No thyromegaly present.  No cervical lymphadenopathy Cardiovascular: Normal rate, regular rhythm and normal heart sounds.   No murmur heard. No carotid bruit .  No edema Pulmonary/Chest: Effort normal and breath sounds normal. No respiratory distress. No has no wheezes. No rales.  Skin: Skin is warm and dry. Not diaphoretic.  Psychiatric: Normal mood and affect. Behavior is normal.      Assessment & Plan:    See Problem List for Assessment and Plan of chronic medical problems.

## 2021-10-23 ENCOUNTER — Encounter: Payer: Self-pay | Admitting: Internal Medicine

## 2021-10-23 ENCOUNTER — Ambulatory Visit (INDEPENDENT_AMBULATORY_CARE_PROVIDER_SITE_OTHER): Payer: Medicare HMO | Admitting: Internal Medicine

## 2021-10-23 ENCOUNTER — Other Ambulatory Visit: Payer: Self-pay

## 2021-10-23 VITALS — BP 120/74 | HR 60 | Temp 98.5°F | Ht 70.0 in | Wt 196.0 lb

## 2021-10-23 DIAGNOSIS — I10 Essential (primary) hypertension: Secondary | ICD-10-CM | POA: Diagnosis not present

## 2021-10-23 DIAGNOSIS — E782 Mixed hyperlipidemia: Secondary | ICD-10-CM | POA: Diagnosis not present

## 2021-10-23 DIAGNOSIS — E119 Type 2 diabetes mellitus without complications: Secondary | ICD-10-CM

## 2021-10-23 DIAGNOSIS — R002 Palpitations: Secondary | ICD-10-CM

## 2021-10-23 DIAGNOSIS — R931 Abnormal findings on diagnostic imaging of heart and coronary circulation: Secondary | ICD-10-CM | POA: Diagnosis not present

## 2021-10-23 LAB — COMPREHENSIVE METABOLIC PANEL
ALT: 24 U/L (ref 0–53)
AST: 21 U/L (ref 0–37)
Albumin: 4.5 g/dL (ref 3.5–5.2)
Alkaline Phosphatase: 42 U/L (ref 39–117)
BUN: 20 mg/dL (ref 6–23)
CO2: 32 mEq/L (ref 19–32)
Calcium: 10.1 mg/dL (ref 8.4–10.5)
Chloride: 101 mEq/L (ref 96–112)
Creatinine, Ser: 1.09 mg/dL (ref 0.40–1.50)
GFR: 69.13 mL/min (ref >60.00)
Glucose, Bld: 110 mg/dL — ABNORMAL HIGH (ref 70–99)
Potassium: 4 mEq/L (ref 3.5–5.1)
Sodium: 140 mEq/L (ref 135–145)
Total Bilirubin: 1.2 mg/dL (ref 0.2–1.2)
Total Protein: 7.4 g/dL (ref 6.0–8.3)

## 2021-10-23 LAB — LIPID PANEL
Cholesterol: 127 mg/dL (ref 0–200)
HDL: 43.7 mg/dL (ref >39.00)
LDL Cholesterol: 55 mg/dL (ref 0–99)
NonHDL: 83.03
Total CHOL/HDL Ratio: 3
Triglycerides: 139 mg/dL (ref 0.0–149.0)
VLDL: 27.8 mg/dL (ref 0.0–40.0)

## 2021-10-23 LAB — HEMOGLOBIN A1C: Hgb A1c MFr Bld: 7 % — ABNORMAL HIGH (ref 4.6–6.5)

## 2021-10-23 NOTE — Assessment & Plan Note (Signed)
Chronic-history of palpitations We started OTC magnesium and the palpitations have resolved-continue

## 2021-10-23 NOTE — Assessment & Plan Note (Signed)
Chronic Sugars have been controlled Check A1c today Continue metformin 500 mg twice daily with food Stressed diabetic diet, regular exercise and keeping his weight down

## 2021-10-23 NOTE — Assessment & Plan Note (Signed)
Chronic Discussed keeping risk factors well controlled-blood pressure, sugar and LDL cholesterol Encourage regular activity, healthy diet and keeping his weight down

## 2021-10-23 NOTE — Assessment & Plan Note (Signed)
Chronic Blood pressure well controlled CMP Continue Coreg 6.25 mg twice daily, Hyzaar 100-25 mg daily

## 2021-10-23 NOTE — Assessment & Plan Note (Signed)
Chronic °Regular exercise and healthy diet encouraged °Check lipid panel  °Continue atorvastatin 20 mg daily °

## 2022-02-21 DIAGNOSIS — L820 Inflamed seborrheic keratosis: Secondary | ICD-10-CM | POA: Diagnosis not present

## 2022-03-02 DIAGNOSIS — Z809 Family history of malignant neoplasm, unspecified: Secondary | ICD-10-CM | POA: Diagnosis not present

## 2022-03-02 DIAGNOSIS — Z825 Family history of asthma and other chronic lower respiratory diseases: Secondary | ICD-10-CM | POA: Diagnosis not present

## 2022-03-02 DIAGNOSIS — Z8249 Family history of ischemic heart disease and other diseases of the circulatory system: Secondary | ICD-10-CM | POA: Diagnosis not present

## 2022-03-02 DIAGNOSIS — E785 Hyperlipidemia, unspecified: Secondary | ICD-10-CM | POA: Diagnosis not present

## 2022-03-02 DIAGNOSIS — E663 Overweight: Secondary | ICD-10-CM | POA: Diagnosis not present

## 2022-03-02 DIAGNOSIS — Z833 Family history of diabetes mellitus: Secondary | ICD-10-CM | POA: Diagnosis not present

## 2022-03-02 DIAGNOSIS — Z87891 Personal history of nicotine dependence: Secondary | ICD-10-CM | POA: Diagnosis not present

## 2022-03-02 DIAGNOSIS — Z7984 Long term (current) use of oral hypoglycemic drugs: Secondary | ICD-10-CM | POA: Diagnosis not present

## 2022-03-02 DIAGNOSIS — E119 Type 2 diabetes mellitus without complications: Secondary | ICD-10-CM | POA: Diagnosis not present

## 2022-03-02 DIAGNOSIS — Z7722 Contact with and (suspected) exposure to environmental tobacco smoke (acute) (chronic): Secondary | ICD-10-CM | POA: Diagnosis not present

## 2022-03-02 DIAGNOSIS — I10 Essential (primary) hypertension: Secondary | ICD-10-CM | POA: Diagnosis not present

## 2022-03-28 ENCOUNTER — Other Ambulatory Visit: Payer: Self-pay | Admitting: Internal Medicine

## 2022-04-05 ENCOUNTER — Ambulatory Visit (INDEPENDENT_AMBULATORY_CARE_PROVIDER_SITE_OTHER): Payer: Medicare HMO

## 2022-04-05 VITALS — Wt 196.0 lb

## 2022-04-05 DIAGNOSIS — Z0000 Encounter for general adult medical examination without abnormal findings: Secondary | ICD-10-CM

## 2022-04-05 DIAGNOSIS — R141 Gas pain: Secondary | ICD-10-CM | POA: Insufficient documentation

## 2022-04-05 DIAGNOSIS — K5730 Diverticulosis of large intestine without perforation or abscess without bleeding: Secondary | ICD-10-CM | POA: Insufficient documentation

## 2022-04-05 DIAGNOSIS — Z86010 Personal history of colonic polyps: Secondary | ICD-10-CM | POA: Insufficient documentation

## 2022-04-05 NOTE — Progress Notes (Signed)
? ?Subjective:  ? Billy Fernandez is a 71 y.o. male who presents for Medicare Annual/Subsequent preventive examination. ? ?Virtual Visit via Telephone Note ? ?I connected with  Billy Fernandez on 04/05/22 at 11:00 AM EDT by telephone and verified that I am speaking with the correct person using two identifiers. ? ?Location: ?Patient: Home ?Provider: [facility] ?Persons participating in the virtual visit: patient/Nurse Health Advisor ?  ?I discussed the limitations, risks, security and privacy concerns of performing an evaluation and management service by telephone and the availability of in person appointments. The patient expressed understanding and agreed to proceed. ? ?Interactive audio and video telecommunications were attempted between this nurse and patient, however failed, due to patient having technical difficulties OR patient did not have access to video capability.  We continued and completed visit with audio only. ? ?Some vital signs may be absent or patient reported.  ? ?Tiphanie Vo Dionne Ano, LPN  ? ?Review of Systems    ? ?Cardiac Risk Factors include: advanced age (>16mn, >>34women);diabetes mellitus;dyslipidemia;hypertension;male gender ? ?   ?Objective:  ?  ?Today's Vitals  ? 04/05/22 1509  ?Weight: 196 lb (88.9 kg)  ? ?Body mass index is 28.12 kg/m?. ? ? ?  04/05/2022  ?  3:17 PM  ?Advanced Directives  ?Does Patient Have a Medical Advance Directive? Yes  ?Type of [place]  ?Copy of [place]? Yes - validated most recent copy scanned in chart (See row information)  ? ? ?Current Medications (verified) ?Outpatient Encounter Medications as of 04/05/2022  ?Medication Sig  ? aspirin EC 81 MG tablet Take 81 mg by mouth daily. Swallow whole.  ? atorvastatin (LIPITOR) 20 MG tablet Take 1 tablet (20 mg total) by mouth daily.  ? carvedilol (COREG) 6.25 MG tablet TAKE 1 TABLET BY MOUTH TWICE A DAY  ? CINNAMON PO Take 1,000 mg by  mouth daily.  ? fish oil-omega-3 fatty acids 1000 MG capsule Take 2 g by mouth daily.  ? Lancets (FREESTYLE) lancets Use to check blood sugars. E11.9  ? losartan-hydrochlorothiazide (HYZAAR) 100-25 MG tablet Take 1 tablet by mouth daily.  ? Magnesium 250 MG TABS Take by mouth daily.  ? metFORMIN (GLUCOPHAGE) 1000 MG tablet TAKE 0.5 TABLET (500 MG TOTAL) BY MOUTH TWICE TIMES DAILY WITH A MEAL.  ? ONETOUCH VERIO test strip use to check blood sugar once daily  ? zinc gluconate 50 MG tablet Take 50 mg by mouth daily.  ? [DISCONTINUED] PREVNAR 20 0.5 ML injection   ? ?No facility-administered encounter medications on file as of 04/05/2022.  ? ? ?Allergies (verified) ?Patient has no known allergies.  ? ?History: ?Past Medical History:  ?Diagnosis Date  ? Cervical radiculopathy at C6 04/14/2020  ? Essential hypertension, benign 05/28/2013  ? Hyperlipidemia 06/29/2013  ? Type 2 diabetes mellitus 07/01/2014  ? ?History reviewed. No pertinent surgical history. ?Family History  ?Problem Relation Age of Onset  ? Breast cancer Mother   ? Cancer Mother   ? Diabetes Mother   ? Heart disease Father   ? Hyperlipidemia Father   ? Hypertension Father   ? Diabetes Brother   ? Heart disease Brother   ?     cabg  ? Dementia Paternal Grandmother   ?     Possible Alzheimer's disease  ? Drug abuse Sister   ? Alcohol abuse Neg Hx   ? COPD Neg Hx   ? Depression Neg Hx   ?  Early death Neg Hx   ? Hearing loss Neg Hx   ? Kidney disease Neg Hx   ? Stroke Neg Hx   ? ?Social History  ? ?Socioeconomic History  ? Marital status: Married  ?  Spouse name: Not on file  ? Number of children: 2  ? Years of education: 61  ? Highest education level: High school graduate  ?Occupational History  ? Occupation: Retired  ?Tobacco Use  ? Smoking status: Former  ?  Years: 25.00  ?  Types: Cigarettes  ?  Quit date: 05/07/1981  ?  Years since quitting: 40.9  ? Smokeless tobacco: Never  ?Vaping Use  ? Vaping Use: Never used  ?Substance and Sexual Activity  ? Alcohol use:  Yes  ?  Alcohol/week: 1.0 standard drink  ?  Types: 1 Cans of beer per week  ?  Comment: OCC  ? Drug use: Not Currently  ? Sexual activity: Yes  ?Other Topics Concern  ? Not on file  ?Social History Narrative  ? Lives in 2 story home with wife  ? They enjoy travelling and exercising - very independent and active  ? ?Social Determinants of Health  ? ?Financial Resource Strain: Low Risk   ? Difficulty of Paying Living Expenses: Not hard at all  ?Food Insecurity: No Food Insecurity  ? Worried About Charity fundraiser in the Last Year: Never true  ? Ran Out of Food in the Last Year: Never true  ?Transportation Needs: No Transportation Needs  ? Lack of Transportation (Medical): No  ? Lack of Transportation (Non-Medical): No  ?Physical Activity: Sufficiently Active  ? Days of Exercise per Week: 7 days  ? Minutes of Exercise per Session: 30 min  ?Stress: No Stress Concern Present  ? Feeling of Stress : Not at all  ?Social Connections: Socially Integrated  ? Frequency of Communication with Friends and Family: More than three times a week  ? Frequency of Social Gatherings with Friends and Family: More than three times a week  ? Attends Religious Services: More than 4 times per year  ? Active Member of Clubs or Organizations: Yes  ? Attends Archivist Meetings: More than 4 times per year  ? Marital Status: Married  ? ? ?Tobacco Counseling ?Counseling given: Not Answered ? ? ?Clinical Intake: ? ?Pre-visit preparation completed: Yes ? ?Pain : No/denies pain ? ?  ? ?BMI - recorded: 28.12 ?Nutritional Status: BMI 25 -29 Overweight ?Nutritional Risks: None ?Diabetes: Yes ?CBG done?: No ?Did pt. bring in CBG monitor from home?: No ? ?How often do you need to have someone help you when you read instructions, pamphlets, or other written materials from your doctor or pharmacy?: 1 - Never ? ?Diabetic? Nutrition Risk Assessment: ? ?Has the patient had any N/V/D within the last 2 months?  No  ?Does the patient have any  non-healing wounds?  No  ?Has the patient had any unintentional weight loss or weight gain?  No  ? ?Diabetes: ? ?Is the patient diabetic?  Yes  ?If diabetic, was a CBG obtained today?  No  ?Did the patient bring in their glucometer from home?  No  ?How often do you monitor your CBG's? Every other day to every 2 days - 90-120 usually.  ? ?Financial Strains and Diabetes Management: ? ?Are you having any financial strains with the device, your supplies or your medication? No .  ?Does the patient want to be seen by Chronic Care Management for management of  their diabetes?  No  ?Would the patient like to be referred to a Nutritionist or for Diabetic Management?  No  ? ?Diabetic Exams: ? ?Diabetic Eye Exam: Completed 05/09/2021.  ? ?Diabetic Foot Exam: Completed 04/14/2020. Pt has been advised about the importance in completing this exam. Pt is scheduled for diabetic foot exam on next appt 04/17/22.   ? ?Interpreter Needed?: No ? ?Information entered by :: Jalexus Brett, LPN ? ? ?Activities of Daily Living ? ?  04/05/2022  ?  3:18 PM 04/16/2021  ? 10:04 AM  ?In your present state of health, do you have any difficulty performing the following activities:  ?Hearing? 0 0  ?Vision? 0 0  ?Difficulty concentrating or making decisions? 0 0  ?Walking or climbing stairs? 0 0  ?Dressing or bathing? 0 0  ?Doing errands, shopping? 0 0  ?Preparing Food and eating ? N   ?Using the Toilet? N   ?In the past six months, have you accidently leaked urine? N   ?Do you have problems with loss of bowel control? N   ?Managing your Medications? N   ?Managing your Finances? N   ?Housekeeping or managing your Housekeeping? N   ? ? ?Patient Care Team: ?Binnie Rail, MD as PCP - General (Internal Medicine) ?Foltanski, Cleaster Corin, [facility] as Pharmacist (Pharmacist) ?Adrian Prows, MD as Consulting Physician (Cardiology) ?Murvin Donning, MD (Dermatology) ? ?Indicate any recent Medical Services you may have received from other than [facility] providers in the past year  (date may be approximate). ? ?   ?Assessment:  ? This is a routine wellness examination for [facility]. ? ?Hearing/Vision screen ?Hearing Screening - Comments:: Denies hearing difficulties   ?Vision Screening -

## 2022-04-05 NOTE — Patient Instructions (Signed)
Billy Fernandez , ?Thank you for taking time to come for your Medicare Wellness Visit. I appreciate your ongoing commitment to your health goals. Please review the following plan we discussed and let me know if I can assist you in the future.  ? ?Screening recommendations/referrals: ?Colonoscopy: Done 05/30/2017 - Repeat in 5 years *due this year! ?Recommended yearly ophthalmology/optometry visit for glaucoma screening and checkup ?Recommended yearly dental visit for hygiene and checkup ? ?Vaccinations: ?Influenza vaccine: Done 09/11/2021 - Repeat annually ?Pneumococcal vaccine: Done 04/24/2021 ?Tdap vaccine: Done 07/23/2017 - Repeat in 10 years ?Shingles vaccine: Done 11/06/2020 & 03/19/2021   ?Covid-19: Done 02/04/2020, 02/27/2020, 10/30/2020, & 07/10/2021 ? ?Advanced directives: in chart ? ?Conditions/risks identified: Keep up the great work!  ? ?Next appointment: Follow up in one year for your annual wellness visit.  ? ?Preventive Care 27 Years and Older, Male ? ?Preventive care refers to lifestyle choices and visits with your health care provider that can promote health and wellness. ?What does preventive care include? ?A yearly physical exam. This is also called an annual well check. ?Dental exams once or twice a year. ?Routine eye exams. Ask your health care provider how often you should have your eyes checked. ?Personal lifestyle choices, including: ?Daily care of your teeth and gums. ?Regular physical activity. ?Eating a healthy diet. ?Avoiding tobacco and drug use. ?Limiting alcohol use. ?Practicing safe sex. ?Taking low doses of aspirin every day. ?Taking vitamin and mineral supplements as recommended by your health care provider. ?What happens during an annual well check? ?The services and screenings done by your health care provider during your annual well check will depend on your age, overall health, lifestyle risk factors, and family history of disease. ?Counseling  ?Your health care provider may ask you questions  about your: ?Alcohol use. ?Tobacco use. ?Drug use. ?Emotional well-being. ?Home and relationship well-being. ?Sexual activity. ?Eating habits. ?History of falls. ?Memory and ability to understand (cognition). ?Work and work Statistician. ?Screening  ?You may have the following tests or measurements: ?Height, weight, and BMI. ?Blood pressure. ?Lipid and cholesterol levels. These may be checked every 5 years, or more frequently if you are over 11 years old. ?Skin check. ?Lung cancer screening. You may have this screening every year starting at age 108 if you have a 30-pack-year history of smoking and currently smoke or have quit within the past 15 years. ?Fecal occult blood test (FOBT) of the stool. You may have this test every year starting at age 50. ?Flexible sigmoidoscopy or colonoscopy. You may have a sigmoidoscopy every 5 years or a colonoscopy every 10 years starting at age 58. ?Prostate cancer screening. Recommendations will vary depending on your family history and other risks. ?Hepatitis C blood test. ?Hepatitis B blood test. ?Sexually transmitted disease (STD) testing. ?Diabetes screening. This is done by checking your blood sugar (glucose) after you have not eaten for a while (fasting). You may have this done every 1-3 years. ?Abdominal aortic aneurysm (AAA) screening. You may need this if you are a current or former smoker. ?Osteoporosis. You may be screened starting at age 75 if you are at high risk. ?Talk with your health care provider about your test results, treatment options, and if necessary, the need for more tests. ?Vaccines  ?Your health care provider may recommend certain vaccines, such as: ?Influenza vaccine. This is recommended every year. ?Tetanus, diphtheria, and acellular pertussis (Tdap, Td) vaccine. You may need a Td booster every 10 years. ?Zoster vaccine. You may need this after age 35. ?  Pneumococcal 13-valent conjugate (PCV13) vaccine. One dose is recommended after age 48. ?Pneumococcal  polysaccharide (PPSV23) vaccine. One dose is recommended after age 37. ?Talk to your health care provider about which screenings and vaccines you need and how often you need them. ?This information is not intended to replace advice given to you by your health care provider. Make sure you discuss any questions you have with your health care provider. ?Document Released: 01/05/2016 Document Revised: 08/28/2016 Document Reviewed: 10/10/2015 ?Elsevier Interactive Patient Education ? 2017 [place]. ? ?Fall Prevention in the Home ?Falls can cause injuries. They can happen to people of all ages. There are many things you can do to make your home safe and to help prevent falls. ?What can I do on the outside of my home? ?Regularly fix the edges of walkways and driveways and fix any cracks. ?Remove anything that might make you trip as you walk through a door, such as a raised step or threshold. ?Trim any bushes or trees on the path to your home. ?Use bright outdoor lighting. ?Clear any walking paths of anything that might make someone trip, such as rocks or tools. ?Regularly check to see if handrails are loose or broken. Make sure that both sides of any steps have handrails. ?Any raised decks and porches should have guardrails on the edges. ?Have any leaves, snow, or ice cleared regularly. ?Use sand or salt on walking paths during winter. ?Clean up any spills in your garage right away. This includes oil or grease spills. ?What can I do in the bathroom? ?Use night lights. ?Install grab bars by the toilet and in the tub and shower. Do not use towel bars as grab bars. ?Use non-skid mats or decals in the tub or shower. ?If you need to sit down in the shower, use a plastic, non-slip stool. ?Keep the floor dry. Clean up any water that spills on the floor as soon as it happens. ?Remove soap buildup in the tub or shower regularly. ?Attach bath mats securely with double-sided non-slip rug tape. ?Do not have throw rugs and other  things on the floor that can make you trip. ?What can I do in the bedroom? ?Use night lights. ?Make sure that you have a light by your bed that is easy to reach. ?Do not use any sheets or blankets that are too big for your bed. They should not hang down onto the floor. ?Have a firm chair that has side arms. You can use this for support while you get dressed. ?Do not have throw rugs and other things on the floor that can make you trip. ?What can I do in the kitchen? ?Clean up any spills right away. ?Avoid walking on wet floors. ?Keep items that you use a lot in easy-to-reach places. ?If you need to reach something above you, use a strong step stool that has a grab bar. ?Keep electrical cords out of the way. ?Do not use floor polish or wax that makes floors slippery. If you must use wax, use non-skid floor wax. ?Do not have throw rugs and other things on the floor that can make you trip. ?What can I do with my stairs? ?Do not leave any items on the stairs. ?Make sure that there are handrails on both sides of the stairs and use them. Fix handrails that are broken or loose. Make sure that handrails are as long as the stairways. ?Check any carpeting to make sure that it is firmly attached to the stairs. Fix any  carpet that is loose or worn. ?Avoid having throw rugs at the top or bottom of the stairs. If you do have throw rugs, attach them to the floor with carpet tape. ?Make sure that you have a light switch at the top of the stairs and the bottom of the stairs. If you do not have them, ask someone to add them for you. ?What else can I do to help prevent falls? ?Wear shoes that: ?Do not have high heels. ?Have rubber bottoms. ?Are comfortable and fit you well. ?Are closed at the toe. Do not wear sandals. ?If you use a stepladder: ?Make sure that it is fully opened. Do not climb a closed stepladder. ?Make sure that both sides of the stepladder are locked into place. ?Ask someone to hold it for you, if possible. ?Clearly  mark and make sure that you can see: ?Any grab bars or handrails. ?First and last steps. ?Where the edge of each step is. ?Use tools that help you move around (mobility aids) if they are needed. These in

## 2022-04-10 ENCOUNTER — Encounter: Payer: Self-pay | Admitting: Cardiology

## 2022-04-10 ENCOUNTER — Ambulatory Visit: Payer: Medicare HMO | Admitting: Cardiology

## 2022-04-10 VITALS — BP 114/71 | HR 61 | Temp 98.4°F | Resp 16 | Ht 70.0 in | Wt 203.6 lb

## 2022-04-10 DIAGNOSIS — R002 Palpitations: Secondary | ICD-10-CM | POA: Diagnosis not present

## 2022-04-10 DIAGNOSIS — I10 Essential (primary) hypertension: Secondary | ICD-10-CM

## 2022-04-10 DIAGNOSIS — E7800 Pure hypercholesterolemia, unspecified: Secondary | ICD-10-CM | POA: Diagnosis not present

## 2022-04-10 DIAGNOSIS — R931 Abnormal findings on diagnostic imaging of heart and coronary circulation: Secondary | ICD-10-CM | POA: Diagnosis not present

## 2022-04-10 NOTE — Progress Notes (Signed)
?Primary Physician/Referring:  Binnie Rail, MD ? ?Patient ID: Billy Fernandez, male    DOB: 21-Aug-1951, 71 y.o.   MRN: [id number] ? ?No chief complaint on file. ? ?HPI:   ? ?Billy Fernandez  is a 71 y.o. Caucasian male patient with hypertension, hyperlipidemia and type 2 diabetes mellitus, diet controlled and chronic palpitations.  ? ?Patient was last seen in our office 05/07/2022 with concerns of palpitations.  At that time patient was reassured regarding palpitations and no changes were made, patient was advised to follow-up on an as-needed basis. ? ?Patient now presents for office visit at his request with complaints of fatigue.  Symptoms lasted for for couple days, this happened last week.  He has had no recurrence.  He has returned back to full activity without any limitations. ? ?Past Medical History:  ?Diagnosis Date  ? Cervical radiculopathy at C6 04/14/2020  ? Essential hypertension, benign 05/28/2013  ? Hyperlipidemia 06/29/2013  ? Type 2 diabetes mellitus 07/01/2014  ? ?Family History  ?Problem Relation Age of Onset  ? Breast cancer Mother   ? Cancer Mother   ? Diabetes Mother   ? Heart disease Father   ? Hyperlipidemia Father   ? Hypertension Father   ? Diabetes Brother   ? Heart disease Brother   ?     cabg  ? Dementia Paternal Grandmother   ?     Possible Alzheimer's disease  ? Drug abuse Sister   ? Alcohol abuse Neg Hx   ? COPD Neg Hx   ? Depression Neg Hx   ? Early death Neg Hx   ? Hearing loss Neg Hx   ? Kidney disease Neg Hx   ? Stroke Neg Hx   ?  ?Social History  ? ?Tobacco Use  ? Smoking status: Former  ?  Packs/day: 0.50  ?  Years: 25.00  ?  Pack years: 12.50  ?  Types: Cigarettes  ?  Quit date: 05/07/1981  ?  Years since quitting: 40.9  ? Smokeless tobacco: Never  ?Substance Use Topics  ? Alcohol use: Yes  ?  Alcohol/week: 1.0 standard drink  ?  Types: 1 Cans of beer per week  ?  Comment: occasionally/rarely  ? ?Marital Status: Married  ?ROS  ?Review of Systems  ?Cardiovascular:  Positive for  palpitations. Negative for chest pain, dyspnea on exertion and leg swelling.  ?Gastrointestinal:  Negative for melena.  ? ?Objective  ?Blood pressure 114/71, pulse 61, temperature 98.4 ?F (36.9 ?C), temperature source Temporal, resp. rate 16, height '5\' 10"'$  (1.778 m), weight 203 lb 9.6 oz (92.4 kg), SpO2 96 %. Body mass index is 29.21 kg/m?.  ? ?  04/10/2022  ? 10:13 AM 04/05/2022  ?  3:09 PM 10/23/2021  ?  9:23 AM  ?Vitals with BMI  ?Height '5\' 10"'$   '5\' 10"'$   ?Weight 203 lbs 10 oz 196 lbs 196 lbs  ?BMI 29.21  28.12  ?Systolic 355  974  ?Diastolic 71  74  ?Pulse 61  60  ?  ? Physical Exam  ?Neck: No JVD present.  ?Cardiovascular: Regular rhythm, normal heart sounds, intact distal pulses and normal pulses. Exam reveals no gallop.  ?No murmur heard. ?Pulmonary/Chest: Effort normal and breath sounds normal.  ?Abdominal: Soft. Bowel sounds are normal.  ?Musculoskeletal:     ?   General: No edema.   ? ?Laboratory examination:  ? ? ?  Latest Ref Rng & Units 10/23/2021  ? 10:27 AM 03/19/2021  ? 10:26 AM 11/01/2020  ?  10:48 AM  ?CMP  ?Glucose 70 - 99 mg/dL 110   117   110    ?BUN 6 - 23 mg/dL '20   14   18    '$ ?Creatinine 0.40 - 1.50 mg/dL 1.09   1.09   1.00    ?Sodium 135 - 145 mEq/L 140   140   139    ?Potassium 3.5 - 5.1 mEq/L 4.0   4.5   4.8    ?Chloride 96 - 112 mEq/L 101   101   102    ?CO2 19 - 32 mEq/L 32   33   33    ?Calcium 8.4 - 10.5 mg/dL 10.1   10.1   9.9    ?Total Protein 6.0 - 8.3 g/dL 7.4   7.0   6.8    ?Total Bilirubin 0.2 - 1.2 mg/dL 1.2   1.2   0.7    ?Alkaline Phos 39 - 117 U/L 42   34   43    ?AST 0 - 37 U/L '21   20   26    '$ ?ALT 0 - 53 U/L '24   19   25    '$ ? ? ?  Latest Ref Rng & Units 04/16/2021  ? 10:54 AM 04/14/2020  ?  8:51 AM 12/25/2018  ?  9:13 AM  ?CBC  ?WBC 4.0 - 10.5 K/uL 5.9   6.7   7.0    ?Hemoglobin 13.0 - 17.0 g/dL 15.8   15.8   16.3    ?Hematocrit 39.0 - 52.0 % 45.2   46.5   47.7    ?Platelets 150.0 - 400.0 K/uL 202.0   214.0   241    ? ?Lab Results  ?Component Value Date  ? CHOL 127 10/23/2021  ? HDL  43.70 10/23/2021  ? [place] 55 10/23/2021  ? TRIG 139.0 10/23/2021  ? CHOLHDL 3 10/23/2021  ?  ? ?HEMOGLOBIN A1C ?Lab Results  ?Component Value Date  ? HGBA1C 7.0 (H) 10/23/2021  ? ?TSH ?Recent Labs  ?  04/16/21 ?1054  ?TSH 1.16  ? ?Allergies  ?No Known Allergies  ?  ?Final Medications at End of Visit   ? ?Current Outpatient Medications:  ?  atorvastatin (LIPITOR) 20 MG tablet, Take 1 tablet (20 mg total) by mouth daily., Disp: 90 tablet, Rfl: 0 ?  carvedilol (COREG) 6.25 MG tablet, TAKE 1 TABLET BY MOUTH TWICE A DAY, Disp: 180 tablet, Rfl: 0 ?  CINNAMON PO, Take 1,000 mg by mouth daily., Disp: , Rfl:  ?  fish oil-omega-3 fatty acids 1000 MG capsule, Take 2 g by mouth daily., Disp: , Rfl:  ?  Lancets (FREESTYLE) lancets, Use to check blood sugars. E11.9, Disp: 100 each, Rfl: 1 ?  losartan-hydrochlorothiazide (HYZAAR) 100-25 MG tablet, Take 1 tablet by mouth daily., Disp: 90 tablet, Rfl: 2 ?  Magnesium 250 MG TABS, Take by mouth daily., Disp: , Rfl:  ?  metFORMIN (GLUCOPHAGE) 1000 MG tablet, TAKE 0.5 TABLET (500 MG TOTAL) BY MOUTH TWICE TIMES DAILY WITH A MEAL., Disp: 90 tablet, Rfl: 2 ?  ONETOUCH VERIO test strip, use to check blood sugar once daily, Disp: 100 each, Rfl: 0  ?  ?Radiology:  ? ?Coronary calcium score 04/19/2021: ?Coronary calcium score of 129. This was 65 percentile for age-, race-, and sex-matched controls. ?LM: 0 ?LAD: 129 ?LCx: 0 ?RCA: 0. ?Visualized cardiac and noncardiac structures within normal limits. ? ?Cardiac Studies:  ? ?Outside echo 08/31/10: Hyperdynamic LV. Mild RV dilatation, normal  systolic function. ? ?EKG:  ? ?EKG 04/10/2022: Normal sinus rhythm at rate of 56 bpm, no evidence of ischemia. ? ?EKG 05/07/2021: Normal sinus rhythm at rate of 65 bpm, normal axis.  No evidence of ischemia, normal EKG.   ?  ?Assessment  ? ?  ICD-10-CM   ?1. Elevated coronary artery calcium score 04/19/21: Coronary calcium score of 129 in 49 percentile  R93.1   ?  ?2. Essential hypertension, benign  I10 EKG  12-Lead  ?  ?3. Pure hypercholesterolemia  E78.00   ?  ?4. Palpitations  R00.2   ?  ?  ? ?Medications Discontinued During This Encounter  ?Medication Reason  ? aspirin EC 81 MG tablet   ? zinc gluconate 50 MG tablet   ?  ?No orders of the defined types were placed in this encounter. ? ?Orders Placed This Encounter  ?Procedures  ? EKG 12-Lead  ? ?Recommendations:  ? ?Cejay Cambre is a 71 y.o.  Caucasian male patient with hypertension, hyperlipidemia and type 2 diabetes mellitus, diet controlled and chronic palpitations.  Patient made an appointment to see me as he had an episode of fatigue last week and he was concerned about coronary artery disease. ? ?Patient's symptoms of fatigue is completely resolved within a couple days.  He is returned back to doing all his physical chores without any limitations.  No clinical evidence of heart failure, physical examination is completely normal.  EKG is normal as well.  I discussed with him regarding presentation of ACS in a diabetic patient. ? ?I reviewed external labs, all labs are within normal limits.  In the absence of any other symptoms of angina pectoris, no persistent fatigue, well-controlled risk factors including diabetes mellitus, hypertension and hyperlipidemia, no further evaluation is indicated from cardiac standpoint.  He is at moderate coronary calcium percentile risk for his age, but on appropriate primary prevention strategy.  Hence no further evaluation is indicated. ? ?Chronic palpitations always happen at rest last a few seconds, these are indicated of PACs and PVCs and patient is not concerned about them.  I will see him back on a as needed basis.  Wife present and all questions answered.   ?

## 2022-04-16 ENCOUNTER — Encounter: Payer: Self-pay | Admitting: Internal Medicine

## 2022-04-16 NOTE — Progress Notes (Signed)
? ? ?Subjective:  ? ? Patient ID: Billy Fernandez, male    DOB: [birth year], 71 y.o.   MRN: [id number] ? ? ?This visit occurred during the SARS-CoV-2 public health emergency.  Safety protocols were in place, including screening questions prior to the visit, additional usage of staff PPE, and extensive cleaning of exam room while observing appropriate contact time as indicated for disinfecting solutions. ? ? ?HPI ?Billy Fernandez is here for  ?Chief Complaint  ?Patient presents with  ? Annual Exam  ? ? ? ?Ct scan 04/16/21 for CAC score - 2 mm RML nodule - likely benign - can repeat CT now ? ? ? ?Medications and allergies reviewed with patient and updated if appropriate. ? ? ?Current Outpatient Medications on File Prior to Visit  ?Medication Sig Dispense Refill  ? atorvastatin (LIPITOR) 20 MG tablet Take 1 tablet (20 mg total) by mouth daily. 90 tablet 0  ? carvedilol (COREG) 6.25 MG tablet TAKE 1 TABLET BY MOUTH TWICE A DAY 180 tablet 0  ? CINNAMON PO Take 1,000 mg by mouth daily.    ? fish oil-omega-3 fatty acids 1000 MG capsule Take 2 g by mouth daily.    ? Lancets (FREESTYLE) lancets Use to check blood sugars. E11.9 100 each 1  ? losartan-hydrochlorothiazide (HYZAAR) 100-25 MG tablet Take 1 tablet by mouth daily. 90 tablet 2  ? Magnesium 250 MG TABS Take by mouth daily.    ? metFORMIN (GLUCOPHAGE) 1000 MG tablet TAKE 0.5 TABLET (500 MG TOTAL) BY MOUTH TWICE TIMES DAILY WITH A MEAL. 90 tablet 2  ? ONETOUCH VERIO test strip use to check blood sugar once daily 100 each 0  ? ?No current facility-administered medications on file prior to visit.  ? ? ?Review of Systems  ?Constitutional:  Negative for chills and fever.  ?Eyes:  Negative for visual disturbance.  ?Respiratory:  Negative for cough, shortness of breath and wheezing.   ?Cardiovascular:  Positive for palpitations (occ, chronic - no change). Negative for chest pain and leg swelling.  ?Gastrointestinal:  Negative for abdominal pain, blood in stool, constipation, diarrhea  and nausea.  ?     No gerd  ?Genitourinary:  Negative for difficulty urinating, dysuria and hematuria.  ?Musculoskeletal:  Positive for arthralgias (mild thumb pain). Negative for back pain.  ?     Right finger locks  ?Skin:  Negative for rash.  ?Neurological:  Negative for light-headedness and headaches.  ?Psychiatric/Behavioral:  Negative for dysphoric mood. The patient is not nervous/anxious.   ? ?   ?Objective:  ? ?Vitals:  ? 04/17/22 0810  ?BP: 120/82  ?Pulse: 61  ?Temp: 98.1 ?F (36.7 ?C)  ?SpO2: 99%  ? ?Filed Weights  ? 04/17/22 0810  ?Weight: 203 lb (92.1 kg)  ? ?Body mass index is 29.13 kg/m?. ? ?BP Readings from Last 3 Encounters:  ?04/17/22 120/82  ?04/10/22 114/71  ?10/23/21 120/74  ? ? ?Wt Readings from Last 3 Encounters:  ?04/17/22 203 lb (92.1 kg)  ?04/10/22 203 lb 9.6 oz (92.4 kg)  ?04/05/22 196 lb (88.9 kg)  ? ? ?  ?Physical Exam ?Constitutional: He appears well-developed and well-nourished. No distress.  ?HENT:  ?Head: Normocephalic and atraumatic.  ?Right Ear: External ear normal.  ?Left Ear: External ear normal.  ?Mouth/Throat: Oropharynx is clear and moist.  ?Normal ear canals and TM b/l  ?Eyes: Conjunctivae and EOM are normal.  ?Neck: Neck supple. No tracheal deviation present. No thyromegaly present.  ?No carotid bruit  ?Cardiovascular: Normal rate, regular rhythm, normal  heart sounds and intact distal pulses.   ?No murmur heard. ?Pulmonary/Chest: Effort normal and breath sounds normal. No respiratory distress. He has no wheezes. He has no rales.  ?Abdominal: Soft. He exhibits no distension. There is no tenderness.  ?Genitourinary: deferred  ?Musculoskeletal: He exhibits no edema.  ?Lymphadenopathy:   He has no cervical adenopathy.  ?Skin: Skin is warm and dry. He is not diaphoretic.  ?Psychiatric: He has a normal mood and affect. His behavior is normal.  ? ? ?Diabetic Foot Exam - Simple   ?Simple Foot Form ?Diabetic Foot exam was performed with the following findings: Yes 04/17/2022  8:49 AM   ?Visual Inspection ?No deformities, no ulcerations, no other skin breakdown bilaterally: Yes ?Sensation Testing ?Intact to touch and monofilament testing bilaterally: Yes ?Pulse Check ?Posterior Tibialis and Dorsalis pulse intact bilaterally: Yes ?Comments ?  ?  ? ?   ?Assessment & Plan:  ? ?Physical exam: ?Screening blood work  ordered ?Exercise   walking ?Weight  encouraged weight loss ?Substance abuse   none ? ? ?Reviewed recommended immunizations. ? ? ?Health Maintenance  ?Topic Date Due  ? FOOT EXAM  04/14/2021  ? COVID-19 Vaccine (5 - Booster for Pfizer series) 09/04/2021  ? HEMOGLOBIN A1C  04/22/2022  ? OPHTHALMOLOGY EXAM  05/09/2022  ? COLONOSCOPY (Pts 45-75yr Insurance coverage will need to be confirmed)  05/30/2022  ? INFLUENZA VACCINE  07/23/2022  ? TETANUS/TDAP  07/24/2027  ? Pneumonia Vaccine 70 Years old  Completed  ? Hepatitis C Screening  Completed  ? Zoster Vaccines- Shingrix  Completed  ? HPV VACCINES  Aged Out  ? ? ? ?See Problem List for Assessment and Plan of chronic medical problems. ? ? ?

## 2022-04-16 NOTE — Patient Instructions (Addendum)
? ? ? ?Blood work was ordered.   ? ? ?Medications changes include : None ? ? ? ?A Ct scan of your lungs was ordered.     Someone will call you to schedule an appointment.  ? ? ?Return in about 6 months (around 10/17/2022) for 6 month f/u. ? ? ?Health Maintenance, Male ?Adopting a healthy lifestyle and getting preventive care are important in promoting health and wellness. Ask your health care provider about: ?The right schedule for you to have regular tests and exams. ?Things you can do on your own to prevent diseases and keep yourself healthy. ?What should I know about diet, weight, and exercise? ?Eat a healthy diet ? ?Eat a diet that includes plenty of vegetables, fruits, low-fat dairy products, and lean protein. ?Do not eat a lot of foods that are high in solid fats, added sugars, or sodium. ?Maintain a healthy weight ?Body mass index (BMI) is a measurement that can be used to identify possible weight problems. It estimates body fat based on height and weight. Your health care provider can help determine your BMI and help you achieve or maintain a healthy weight. ?Get regular exercise ?Get regular exercise. This is one of the most important things you can do for your health. Most adults should: ?Exercise for at least 150 minutes each week. The exercise should increase your heart rate and make you sweat (moderate-intensity exercise). ?Do strengthening exercises at least twice a week. This is in addition to the moderate-intensity exercise. ?Spend less time sitting. Even light physical activity can be beneficial. ?Watch cholesterol and blood lipids ?Have your blood tested for lipids and cholesterol at 70 years of age, then have this test every 5 years. ?You may need to have your cholesterol levels checked more often if: ?Your lipid or cholesterol levels are high. ?You are older than 70 years of age. ?You are at high risk for heart disease. ?What should I know about cancer screening? ?Many types of cancers can be  detected early and may often be prevented. Depending on your health history and family history, you may need to have cancer screening at various ages. This may include screening for: ?Colorectal cancer. ?Prostate cancer. ?Skin cancer. ?Lung cancer. ?What should I know about heart disease, diabetes, and high blood pressure? ?Blood pressure and heart disease ?High blood pressure causes heart disease and increases the risk of stroke. This is more likely to develop in people who have high blood pressure readings or are overweight. ?Talk with your health care provider about your target blood pressure readings. ?Have your blood pressure checked: ?Every 3-5 years if you are 47-39 years of age. ?Every year if you are 66 years old or older. ?If you are between the ages of 70 and 19 and are a current or former smoker, ask your health care provider if you should have a one-time screening for abdominal aortic aneurysm (AAA). ?Diabetes ?Have regular diabetes screenings. This checks your fasting blood sugar level. Have the screening done: ?Once every three years after age 20 if you are at a normal weight and have a low risk for diabetes. ?More often and at a younger age if you are overweight or have a high risk for diabetes. ?What should I know about preventing infection? ?Hepatitis B ?If you have a higher risk for hepatitis B, you should be screened for this virus. Talk with your health care provider to find out if you are at risk for hepatitis B infection. ?Hepatitis C ?Blood testing is  recommended for: ?Everyone born from [birth year] through 1964. ?Anyone with known risk factors for hepatitis C. ?Sexually transmitted infections (STIs) ?You should be screened each year for STIs, including gonorrhea and chlamydia, if: ?You are sexually active and are younger than 70 years of age. ?You are older than 70 years of age and your health care provider tells you that you are at risk for this type of infection. ?Your sexual activity has changed  since you were last screened, and you are at increased risk for chlamydia or gonorrhea. Ask your health care provider if you are at risk. ?Ask your health care provider about whether you are at high risk for HIV. Your health care provider may recommend a prescription medicine to help prevent HIV infection. If you choose to take medicine to prevent HIV, you should first get tested for HIV. You should then be tested every 3 months for as long as you are taking the medicine. ?Follow these instructions at home: ?Alcohol use ?Do not drink alcohol if your health care provider tells you not to drink. ?If you drink alcohol: ?Limit how much you have to 0-2 drinks a day. ?Know how much alcohol is in your drink. In the [place] one drink equals one 12 oz bottle of beer (355 mL), one 5 oz glass of wine (148 mL), or one 1? oz glass of hard liquor (44 mL). ?Lifestyle ?Do not use any products that contain nicotine or tobacco. These products include cigarettes, chewing tobacco, and vaping devices, such as e-cigarettes. If you need help quitting, ask your health care provider. ?Do not use street drugs. ?Do not share needles. ?Ask your health care provider for help if you need support or information about quitting drugs. ?General instructions ?Schedule regular health, dental, and eye exams. ?Stay current with your vaccines. ?Tell your health care provider if: ?You often feel depressed. ?You have ever been abused or do not feel safe at home. ?Summary ?Adopting a healthy lifestyle and getting preventive care are important in promoting health and wellness. ?Follow your health care provider's instructions about healthy diet, exercising, and getting tested or screened for diseases. ?Follow your health care provider's instructions on monitoring your cholesterol and blood pressure. ?This information is not intended to replace advice given to you by your health care provider. Make sure you discuss any questions you have with your health care  provider. ?Document Revised: 04/30/2021 Document Reviewed: 04/30/2021 ?Elsevier Patient Education ? [employer]. ? ?

## 2022-04-17 ENCOUNTER — Ambulatory Visit (INDEPENDENT_AMBULATORY_CARE_PROVIDER_SITE_OTHER): Payer: Medicare HMO | Admitting: Internal Medicine

## 2022-04-17 VITALS — BP 120/82 | HR 61 | Temp 98.1°F | Ht 70.0 in | Wt 203.0 lb

## 2022-04-17 DIAGNOSIS — R931 Abnormal findings on diagnostic imaging of heart and coronary circulation: Secondary | ICD-10-CM | POA: Diagnosis not present

## 2022-04-17 DIAGNOSIS — E119 Type 2 diabetes mellitus without complications: Secondary | ICD-10-CM | POA: Diagnosis not present

## 2022-04-17 DIAGNOSIS — R918 Other nonspecific abnormal finding of lung field: Secondary | ICD-10-CM | POA: Diagnosis not present

## 2022-04-17 DIAGNOSIS — I10 Essential (primary) hypertension: Secondary | ICD-10-CM

## 2022-04-17 DIAGNOSIS — Z0000 Encounter for general adult medical examination without abnormal findings: Secondary | ICD-10-CM | POA: Diagnosis not present

## 2022-04-17 DIAGNOSIS — J8410 Pulmonary fibrosis, unspecified: Secondary | ICD-10-CM | POA: Insufficient documentation

## 2022-04-17 DIAGNOSIS — E782 Mixed hyperlipidemia: Secondary | ICD-10-CM | POA: Diagnosis not present

## 2022-04-17 DIAGNOSIS — Z125 Encounter for screening for malignant neoplasm of prostate: Secondary | ICD-10-CM | POA: Diagnosis not present

## 2022-04-17 LAB — TSH: TSH: 1.74 u[IU]/mL (ref 0.35–5.50)

## 2022-04-17 LAB — COMPREHENSIVE METABOLIC PANEL
ALT: 25 U/L (ref 0–53)
AST: 24 U/L (ref 0–37)
Albumin: 4.3 g/dL (ref 3.5–5.2)
Alkaline Phosphatase: 39 U/L (ref 39–117)
BUN: 17 mg/dL (ref 6–23)
CO2: 30 mEq/L (ref 19–32)
Calcium: 10 mg/dL (ref 8.4–10.5)
Chloride: 101 mEq/L (ref 96–112)
Creatinine, Ser: 1.11 mg/dL (ref 0.40–1.50)
GFR: 67.41 mL/min (ref >60.00)
Glucose, Bld: 127 mg/dL — ABNORMAL HIGH (ref 70–99)
Potassium: 4.8 mEq/L (ref 3.5–5.1)
Sodium: 139 mEq/L (ref 135–145)
Total Bilirubin: 1.3 mg/dL — ABNORMAL HIGH (ref 0.2–1.2)
Total Protein: 6.8 g/dL (ref 6.0–8.3)

## 2022-04-17 LAB — CBC WITH DIFFERENTIAL/PLATELET
Basophils Absolute: 0 10*3/uL (ref 0.0–0.1)
Basophils Relative: 0.8 % (ref 0.0–3.0)
Eosinophils Absolute: 0.2 10*3/uL (ref 0.0–0.7)
Eosinophils Relative: 3.7 % (ref 0.0–5.0)
HCT: 44.5 % (ref 39.0–52.0)
Hemoglobin: 15.4 g/dL (ref 13.0–17.0)
Lymphocytes Relative: 31.4 % (ref 12.0–46.0)
Lymphs Abs: 1.8 10*3/uL (ref 0.7–4.0)
MCHC: 34.6 g/dL (ref 30.0–36.0)
MCV: 88.9 fl (ref 78.0–100.0)
Monocytes Absolute: 0.6 10*3/uL (ref 0.1–1.0)
Monocytes Relative: 10.9 % (ref 3.0–12.0)
Neutro Abs: 3.1 10*3/uL (ref 1.4–7.7)
Neutrophils Relative %: 53.2 % (ref 43.0–77.0)
Platelets: 218 10*3/uL (ref 150.0–400.0)
RBC: 5.01 Mil/uL (ref 4.22–5.81)
RDW: 13.3 % (ref 11.5–15.5)
WBC: 5.8 10*3/uL (ref 4.0–10.5)

## 2022-04-17 LAB — MICROALBUMIN / CREATININE URINE RATIO
Creatinine,U: 24.8 mg/dL
Microalb Creat Ratio: 2.8 mg/g (ref 0.0–30.0)
Microalb, Ur: <0.7 mg/dL (ref 0.0–1.9)

## 2022-04-17 LAB — LIPID PANEL
Cholesterol: 129 mg/dL (ref 0–200)
HDL: 44.7 mg/dL (ref >39.00)
LDL Cholesterol: 56 mg/dL (ref 0–99)
NonHDL: 84.43
Total CHOL/HDL Ratio: 3
Triglycerides: 140 mg/dL (ref 0.0–149.0)
VLDL: 28 mg/dL (ref 0.0–40.0)

## 2022-04-17 LAB — PSA, MEDICARE: PSA: 1.25 ng/ml (ref 0.10–4.00)

## 2022-04-17 LAB — HEMOGLOBIN A1C: Hgb A1c MFr Bld: 7.6 % — ABNORMAL HIGH (ref 4.6–6.5)

## 2022-04-17 NOTE — Assessment & Plan Note (Addendum)
Chronic ?Elevated coronary calcium score of 129 ?Continue atorvastatin 20 mg daily-LDL goal less than 70 ?Stopped ASA due to bruising ?Blood pressure well controlled ?Check A1c-on metformin for sugars ?Encourage regular exercise, heart healthy diet ?

## 2022-04-17 NOTE — Assessment & Plan Note (Signed)
Chronic °Regular exercise and healthy diet encouraged °Check lipid panel  °Continue atorvastatin 20 mg daily °

## 2022-04-17 NOTE — Assessment & Plan Note (Signed)
CT scan of the lungs 03/2021 showed lung nodule 2 mm right middle lung-likely benign ?Discussed repeat CT at this time ?

## 2022-04-17 NOTE — Assessment & Plan Note (Signed)
Chronic ?Lab Results  ?Component Value Date  ? HGBA1C 7.0 (H) 10/23/2021  ? ?Sugars not as well controlled as desired last fall ?Check A1c, urine microalbumin today ?Continue metformin 500 mg twice daily ?Stressed regular exercise, diabetic diet ?Will adjust medication as needed ? ?

## 2022-04-17 NOTE — Assessment & Plan Note (Signed)
Chronic ?Blood pressure well controlled ?CMP ?Continue Coreg 6.25 mg twice daily, losartan-HCTZ 100-25 mg daily ?

## 2022-04-20 ENCOUNTER — Encounter: Payer: Self-pay | Admitting: Internal Medicine

## 2022-04-24 ENCOUNTER — Encounter: Payer: Self-pay | Admitting: Internal Medicine

## 2022-04-24 ENCOUNTER — Ambulatory Visit: Payer: Medicare HMO | Admitting: Internal Medicine

## 2022-04-24 DIAGNOSIS — E119 Type 2 diabetes mellitus without complications: Secondary | ICD-10-CM

## 2022-04-24 MED ORDER — METFORMIN HCL 1000 MG PO TABS: 1000.0000 mg | ORAL_TABLET | Freq: Two times a day (BID) | ORAL | 2 refills | Status: DC

## 2022-04-27 ENCOUNTER — Encounter: Payer: Self-pay | Admitting: Internal Medicine

## 2022-04-29 MED ORDER — ONETOUCH DELICA LANCETS 30G MISC: 3 refills | Status: DC

## 2022-04-29 MED ORDER — ONETOUCH VERIO VI SOLN: 3 refills | Status: DC

## 2022-04-29 MED ORDER — ONETOUCH VERIO VI STRP: 1.0000 | ORAL_STRIP | Freq: Every day | 0 refills | Status: DC

## 2022-04-29 MED ORDER — ONETOUCH VERIO W/DEVICE KIT: PACK | 0 refills | Status: DC

## 2022-05-07 ENCOUNTER — Encounter: Payer: Self-pay | Admitting: Internal Medicine

## 2022-05-13 ENCOUNTER — Other Ambulatory Visit: Payer: Self-pay | Admitting: Internal Medicine

## 2022-05-14 ENCOUNTER — Inpatient Hospital Stay: Admission: RE | Admit: 2022-05-14 | Payer: Medicare HMO | Source: Ambulatory Visit

## 2022-05-14 DIAGNOSIS — K5730 Diverticulosis of large intestine without perforation or abscess without bleeding: Secondary | ICD-10-CM | POA: Diagnosis not present

## 2022-05-14 DIAGNOSIS — Z86010 Personal history of colonic polyps: Secondary | ICD-10-CM | POA: Diagnosis not present

## 2022-05-14 DIAGNOSIS — K439 Ventral hernia without obstruction or gangrene: Secondary | ICD-10-CM | POA: Diagnosis not present

## 2022-05-14 DIAGNOSIS — Z1211 Encounter for screening for malignant neoplasm of colon: Secondary | ICD-10-CM | POA: Diagnosis not present

## 2022-05-15 ENCOUNTER — Inpatient Hospital Stay: Admission: RE | Admit: 2022-05-15 | Payer: Medicare HMO | Source: Ambulatory Visit

## 2022-05-17 ENCOUNTER — Ambulatory Visit (INDEPENDENT_AMBULATORY_CARE_PROVIDER_SITE_OTHER)
Admission: RE | Admit: 2022-05-17 | Discharge: 2022-05-17 | Disposition: A | Discharge status: Home / Self Care | Payer: Medicare HMO | Source: Ambulatory Visit | Attending: Internal Medicine | Admitting: Internal Medicine

## 2022-05-17 DIAGNOSIS — R918 Other nonspecific abnormal finding of lung field: Secondary | ICD-10-CM | POA: Diagnosis not present

## 2022-05-17 DIAGNOSIS — J8410 Pulmonary fibrosis, unspecified: Secondary | ICD-10-CM | POA: Diagnosis not present

## 2022-05-27 ENCOUNTER — Other Ambulatory Visit: Payer: Self-pay | Admitting: Internal Medicine

## 2022-06-08 ENCOUNTER — Other Ambulatory Visit: Payer: Self-pay | Admitting: Internal Medicine

## 2022-07-02 DIAGNOSIS — L821 Other seborrheic keratosis: Secondary | ICD-10-CM | POA: Diagnosis not present

## 2022-07-02 DIAGNOSIS — L570 Actinic keratosis: Secondary | ICD-10-CM | POA: Diagnosis not present

## 2022-07-02 DIAGNOSIS — L578 Other skin changes due to chronic exposure to nonionizing radiation: Secondary | ICD-10-CM | POA: Diagnosis not present

## 2022-07-15 DIAGNOSIS — Z1211 Encounter for screening for malignant neoplasm of colon: Secondary | ICD-10-CM | POA: Diagnosis not present

## 2022-07-15 DIAGNOSIS — K648 Other hemorrhoids: Secondary | ICD-10-CM | POA: Diagnosis not present

## 2022-07-15 DIAGNOSIS — K5730 Diverticulosis of large intestine without perforation or abscess without bleeding: Secondary | ICD-10-CM | POA: Diagnosis not present

## 2022-07-15 LAB — HM COLONOSCOPY

## 2022-07-21 ENCOUNTER — Other Ambulatory Visit: Payer: Self-pay | Admitting: Internal Medicine

## 2022-07-22 ENCOUNTER — Encounter: Payer: Self-pay | Admitting: Internal Medicine

## 2022-08-08 ENCOUNTER — Encounter: Payer: Self-pay | Admitting: Internal Medicine

## 2022-08-08 NOTE — Progress Notes (Signed)
Outside notes received. Information abstracted. Notes sent to scan.  

## 2022-08-22 ENCOUNTER — Encounter: Payer: Self-pay | Admitting: Internal Medicine

## 2022-08-22 NOTE — Progress Notes (Signed)
Duplicate

## 2022-10-01 ENCOUNTER — Encounter: Payer: Self-pay | Admitting: Internal Medicine

## 2022-10-08 DIAGNOSIS — Z0100 Encounter for examination of eyes and vision without abnormal findings: Secondary | ICD-10-CM | POA: Diagnosis not present

## 2022-10-11 ENCOUNTER — Encounter: Payer: Self-pay | Admitting: Cardiology

## 2022-10-11 ENCOUNTER — Encounter: Payer: Self-pay | Admitting: Internal Medicine

## 2022-10-11 ENCOUNTER — Other Ambulatory Visit: Payer: Medicare HMO

## 2022-10-11 ENCOUNTER — Ambulatory Visit: Payer: Medicare HMO | Admitting: Cardiology

## 2022-10-11 VITALS — BP 124/79 | HR 61 | Temp 97.3°F | Resp 16 | Ht 70.0 in | Wt 202.6 lb

## 2022-10-11 DIAGNOSIS — E7800 Pure hypercholesterolemia, unspecified: Secondary | ICD-10-CM | POA: Diagnosis not present

## 2022-10-11 DIAGNOSIS — R931 Abnormal findings on diagnostic imaging of heart and coronary circulation: Secondary | ICD-10-CM

## 2022-10-11 DIAGNOSIS — I480 Paroxysmal atrial fibrillation: Secondary | ICD-10-CM

## 2022-10-11 DIAGNOSIS — R0609 Other forms of dyspnea: Secondary | ICD-10-CM

## 2022-10-11 DIAGNOSIS — I10 Essential (primary) hypertension: Secondary | ICD-10-CM

## 2022-10-11 DIAGNOSIS — E782 Mixed hyperlipidemia: Secondary | ICD-10-CM

## 2022-10-11 DIAGNOSIS — E119 Type 2 diabetes mellitus without complications: Secondary | ICD-10-CM

## 2022-10-11 MED ORDER — APIXABAN 5 MG PO TABS: 5.0000 mg | ORAL_TABLET | Freq: Two times a day (BID) | ORAL | 3 refills | Status: DC

## 2022-10-11 NOTE — Telephone Encounter (Signed)
From patient.

## 2022-10-11 NOTE — Progress Notes (Unsigned)
Primary Physician/Referring:  Binnie Rail, MD  Patient ID: Billy Fernandez, male    DOB: [birth year], 71 y.o.   MRN: [id number]  Chief Complaint  Patient presents with   Atrial Fibrillation   Follow-up    HPI:    Billy Fernandez  is a 71 y.o. Caucasian male patient with hypertension, hyperlipidemia and type 2 diabetes mellitus, diet controlled and chronic palpitations.   Patient was last seen in our office 05/07/2022 with concerns of palpitations.  At that time patient was reassured regarding palpitations and no changes were made, patient was advised to follow-up on an as-needed basis.  Patient now presents for office visit at his request with complaints of fatigue.  Symptoms lasted for for couple days, this happened last week.  He has had no recurrence.  He has returned back to full activity without any limitations.  Past Medical History:  Diagnosis Date   Cervical radiculopathy at C6 04/14/2020   Essential hypertension, benign 05/28/2013   Hyperlipidemia 06/29/2013   Type 2 diabetes mellitus 07/01/2014     Social History   Tobacco Use   Smoking status: Former    Packs/day: 0.50    Years: 25.00    Total pack years: 12.50    Types: Cigarettes    Quit date: 05/07/1981    Years since quitting: 41.4   Smokeless tobacco: Never  Substance Use Topics   Alcohol use: Yes    Alcohol/week: 1.0 standard drink of alcohol    Types: 1 Cans of beer per week    Comment: occasionally/rarely   Marital Status: Married  ROS  Review of Systems  Constitutional: Positive for malaise/fatigue.  Cardiovascular:  Positive for dyspnea on exertion and palpitations. Negative for chest pain and leg swelling.  Gastrointestinal:  Negative for melena.   Objective  Blood pressure 124/79, pulse 61, temperature (!) 97.3 F (36.3 C), temperature source Temporal, resp. rate 16, height _0  (1.778 m), weight 202 lb 9.6 oz (91.9 kg), SpO2 95 %. Body mass index is 29.07 kg/m.     10/11/2022   10:34 AM  04/17/2022    8:10 AM 04/10/2022   10:13 AM  Vitals with BMI  Height _1  _2  _3   Weight 202 lbs 10 oz 203 lbs 203 lbs 10 oz  BMI 29.07 10.62 69.48  Systolic 546 270 350  Diastolic 79 82 71  Pulse 61 61 61     Physical Exam  Neck: No JVD present.  Cardiovascular: Regular rhythm, normal heart sounds, intact distal pulses and normal pulses. Exam reveals no gallop.  No murmur heard. Pulmonary/Chest: Effort normal and breath sounds normal.  Abdominal: Soft. Bowel sounds are normal.  Musculoskeletal:        General: No edema.     Laboratory examination:   Lab Results  Component Value Date   NA 139 04/17/2022   K 4.8 04/17/2022   CO2 30 04/17/2022   GLUCOSE 127 (H) 04/17/2022   BUN 17 04/17/2022   CREATININE 1.11 04/17/2022   CALCIUM 10.0 04/17/2022   GFRNONAA 67 12/25/2018   Lab Results  Component Value Date   ALT 25 04/17/2022   AST 24 04/17/2022   ALKPHOS 39 04/17/2022   BILITOT 1.3 (H) 04/17/2022      Latest Ref Rng & Units 04/17/2022    8:53 AM 04/16/2021   10:54 AM 04/14/2020    8:51 AM  CBC  WBC 4.0 - 10.5 K/uL 5.8  5.9  6.7   Hemoglobin 13.0 - 17.0  g/dL 15.4  15.8  15.8   Hematocrit 39.0 - 52.0 % 44.5  45.2  46.5   Platelets 150.0 - 400.0 K/uL 218.0  202.0  214.0    Lab Results  Component Value Date   CHOL 129 04/17/2022   HDL 44.70 04/17/2022   LDLCALC 56 04/17/2022   TRIG 140.0 04/17/2022   CHOLHDL 3 04/17/2022    HEMOGLOBIN A1C Lab Results  Component Value Date   HGBA1C 7.6 (H) 04/17/2022   TSH Recent Labs    04/17/22 0853  TSH 1.74   Allergies  No Known Allergies    Final Medications at End of Visit    Current Outpatient Medications:    apixaban (ELIQUIS) 5 MG TABS tablet, Take 1 tablet (5 mg total) by mouth 2 (two) times daily., Disp: 60 tablet, Rfl: 3   atorvastatin (LIPITOR) 20 MG tablet, TAKE 1 TABLET BY MOUTH EVERY DAY, Disp: 90 tablet, Rfl: 2   Blood Glucose Calibration (ONETOUCH VERIO) SOLN, Use as directed, Disp: 3 each,  Rfl: 3   Blood Glucose Monitoring Suppl (ONETOUCH VERIO REFLECT) w/Device KIT, USE TO CHECK BLOOD GLUCOSE DAILY, Disp: 1 kit, Rfl: 0   carvedilol (COREG) 6.25 MG tablet, TAKE 1 TABLET BY MOUTH TWICE A DAY, Disp: 180 tablet, Rfl: 2   CINNAMON PO, Take 1,000 mg by mouth daily., Disp: , Rfl:    fish oil-omega-3 fatty acids 1000 MG capsule, Take 2 g by mouth daily., Disp: , Rfl:    Lancets (FREESTYLE) lancets, Use to check blood sugars. E11.9, Disp: 100 each, Rfl: 1   losartan-hydrochlorothiazide (HYZAAR) 100-25 MG tablet, TAKE 1 TABLET BY MOUTH EVERY DAY, Disp: 90 tablet, Rfl: 2   Magnesium 250 MG TABS, Take by mouth daily., Disp: , Rfl:    metFORMIN (GLUCOPHAGE) 1000 MG tablet, Take 1 tablet (1,000 mg total) by mouth 2 (two) times daily with a meal., Disp: 180 tablet, Rfl: 2   OneTouch Delica Lancets 38G MISC, Use to check blood sugars once a day, Disp: 100 each, Rfl: 3   ONETOUCH VERIO test strip, 1 EACH BY OTHER ROUTE DAILY., Disp: 100 strip, Rfl: 2    Radiology:   Coronary calcium score 04/19/2021: Coronary calcium score of 129. This was 54 percentile for age-, race-, and sex-matched controls. LM: 0 LAD: 129 LCx: 0 RCA: 0. Visualized cardiac and noncardiac structures within normal limits.  Cardiac Studies:   Outside echo 08/31/10: Hyperdynamic LV. Mild RV dilatation, normal systolic function.  EKG:   EKG 10/11/2022: Normal sinus rhythm with rate of 63 bpm, borderline left atrial abnormality, otherwise normal EKG.  Compared to 04/10/2022, no significant change.   Assessment     ICD-10-CM   1. Paroxysmal atrial fibrillation (HCC)  I48.0 PCV ECHOCARDIOGRAM COMPLETE    [facility] MYOCARDIAL PERFUSION WO LEXISCAN    LONG TERM MONITOR (3-14 DAYS)    Ambulatory referral to Sleep Studies    apixaban (ELIQUIS) 5 MG TABS tablet    2. Essential hypertension, benign  I10 EKG 12-Lead    3. Elevated coronary artery calcium score 04/19/21: Coronary calcium score of 129 in 49 percentile  R93.1 PCV  MYOCARDIAL PERFUSION WO LEXISCAN    4. Pure hypercholesterolemia  E78.00     5. Dyspnea on exertion  R06.09 PCV ECHOCARDIOGRAM COMPLETE    PCV MYOCARDIAL PERFUSION WO LEXISCAN      CHA2DS2-VASc Score is 4.  Yearly risk of stroke: 5% (A, HTN, DM, Vasc Dz).  Score of 1=0.6; 2=2.2; 3=3.2; 4=4.8; 5=7.2; 6=9.8; 7=>9.8) -(  CHF; HTN; vasc disease DM,  Male = 1; Age <65 =0; 65-74 = 1,  >75 =2; stroke/embolism= 2).    There are no discontinued medications.   Meds ordered this encounter  Medications   apixaban (ELIQUIS) 5 MG TABS tablet    Sig: Take 1 tablet (5 mg total) by mouth 2 (two) times daily.    Dispense:  60 tablet    Refill:  3   Orders Placed This Encounter  Procedures   Ambulatory referral to Sleep Studies    Referral Priority:   Routine    Referral Type:   Consultation    Referral Reason:   Specialty Services Required    Referred to Provider:   Star Age, MD    Number of Visits Requested:   1   PCV MYOCARDIAL PERFUSION WO LEXISCAN    Standing Status:   Future    Standing Expiration Date:   12/11/2022   LONG TERM MONITOR (3-14 DAYS)    Standing Status:   Future    Standing Expiration Date:   10/12/2023    Order Specific Question:   Where should this test be performed?    Answer:   [facility]    Order Specific Question:   Does the patient have an implanted cardiac device?    Answer:   No    Order Specific Question:   Prescribed days of wear    Answer:   28    Order Specific Question:   Type of enrollment    Answer:   Clinic Enrollment    Order Specific Question:   Release to patient    Answer:   Immediate   EKG 12-Lead   PCV ECHOCARDIOGRAM COMPLETE    Standing Status:   Future    Standing Expiration Date:   10/12/2023   Recommendations:   Billy Fernandez is a 71 y.o.  Caucasian male patient with hypertension, hyperlipidemia and type 2 diabetes mellitus, diet controlled and chronic palpitations.  Patient made an appointment to see me as he had an  episode of fatigue last week and he was concerned about coronary artery disease.  Patient's symptoms of fatigue is completely resolved within a couple days.  He is returned back to doing all his physical chores without any limitations.  No clinical evidence of heart failure, physical examination is completely normal.  EKG is normal as well.  I discussed with him regarding presentation of ACS in a diabetic patient.  I reviewed external labs, all labs are within normal limits.  In the absence of any other symptoms of angina pectoris, no persistent fatigue, well-controlled risk factors including diabetes mellitus, hypertension and hyperlipidemia, no further evaluation is indicated from cardiac standpoint.  He is at moderate coronary calcium percentile risk for his age, but on appropriate primary prevention strategy.  Hence no further evaluation is indicated.  Chronic palpitations always happen at rest last a few seconds, these are indicated of PACs and PVCs and patient is not concerned about them.  I will see him back on a as needed basis.  Wife present and all questions answered.

## 2022-10-13 MED ORDER — DAPAGLIFLOZIN PROPANEDIOL 10 MG PO TABS: 10.0000 mg | ORAL_TABLET | Freq: Every day | ORAL | 1 refills | Status: DC

## 2022-10-14 ENCOUNTER — Other Ambulatory Visit (INDEPENDENT_AMBULATORY_CARE_PROVIDER_SITE_OTHER): Payer: Medicare HMO

## 2022-10-14 ENCOUNTER — Ambulatory Visit: Payer: Medicare HMO

## 2022-10-14 ENCOUNTER — Encounter: Payer: Self-pay | Admitting: Cardiology

## 2022-10-14 DIAGNOSIS — I480 Paroxysmal atrial fibrillation: Secondary | ICD-10-CM | POA: Diagnosis not present

## 2022-10-14 DIAGNOSIS — R931 Abnormal findings on diagnostic imaging of heart and coronary circulation: Secondary | ICD-10-CM | POA: Diagnosis not present

## 2022-10-14 DIAGNOSIS — E119 Type 2 diabetes mellitus without complications: Secondary | ICD-10-CM

## 2022-10-14 DIAGNOSIS — E782 Mixed hyperlipidemia: Secondary | ICD-10-CM

## 2022-10-14 DIAGNOSIS — I10 Essential (primary) hypertension: Secondary | ICD-10-CM | POA: Diagnosis not present

## 2022-10-14 LAB — LIPID PANEL
Cholesterol: 120 mg/dL (ref 0–200)
HDL: 45.7 mg/dL (ref >39.00)
LDL Cholesterol: 50 mg/dL (ref 0–99)
NonHDL: 74.61
Total CHOL/HDL Ratio: 3
Triglycerides: 125 mg/dL (ref 0.0–149.0)
VLDL: 25 mg/dL (ref 0.0–40.0)

## 2022-10-14 LAB — COMPREHENSIVE METABOLIC PANEL
ALT: 21 U/L (ref 0–53)
AST: 21 U/L (ref 0–37)
Albumin: 4.3 g/dL (ref 3.5–5.2)
Alkaline Phosphatase: 38 U/L — ABNORMAL LOW (ref 39–117)
BUN: 22 mg/dL (ref 6–23)
CO2: 29 mEq/L (ref 19–32)
Calcium: 9.5 mg/dL (ref 8.4–10.5)
Chloride: 101 mEq/L (ref 96–112)
Creatinine, Ser: 1.08 mg/dL (ref 0.40–1.50)
GFR: 69.42 mL/min (ref >60.00)
Glucose, Bld: 117 mg/dL — ABNORMAL HIGH (ref 70–99)
Potassium: 3.9 mEq/L (ref 3.5–5.1)
Sodium: 137 mEq/L (ref 135–145)
Total Bilirubin: 1 mg/dL (ref 0.2–1.2)
Total Protein: 7.5 g/dL (ref 6.0–8.3)

## 2022-10-14 LAB — HEMOGLOBIN A1C: Hgb A1c MFr Bld: 7.4 % — ABNORMAL HIGH (ref 4.6–6.5)

## 2022-10-14 LAB — CBC WITH DIFFERENTIAL/PLATELET
Basophils Absolute: 0 10*3/uL (ref 0.0–0.1)
Basophils Relative: 0.7 % (ref 0.0–3.0)
Eosinophils Absolute: 0.1 10*3/uL (ref 0.0–0.7)
Eosinophils Relative: 2.3 % (ref 0.0–5.0)
HCT: 46.5 % (ref 39.0–52.0)
Hemoglobin: 15.9 g/dL (ref 13.0–17.0)
Lymphocytes Relative: 30.9 % (ref 12.0–46.0)
Lymphs Abs: 1.9 10*3/uL (ref 0.7–4.0)
MCHC: 34.1 g/dL (ref 30.0–36.0)
MCV: 88.3 fl (ref 78.0–100.0)
Monocytes Absolute: 0.6 10*3/uL (ref 0.1–1.0)
Monocytes Relative: 9.8 % (ref 3.0–12.0)
Neutro Abs: 3.5 10*3/uL (ref 1.4–7.7)
Neutrophils Relative %: 56.3 % (ref 43.0–77.0)
Platelets: 227 10*3/uL (ref 150.0–400.0)
RBC: 5.27 Mil/uL (ref 4.22–5.81)
RDW: 13.5 % (ref 11.5–15.5)
WBC: 6.3 10*3/uL (ref 4.0–10.5)

## 2022-10-14 LAB — TSH: TSH: 0.99 u[IU]/mL (ref 0.35–5.50)

## 2022-10-14 NOTE — Telephone Encounter (Signed)
From pt

## 2022-10-17 ENCOUNTER — Encounter: Payer: Self-pay | Admitting: Internal Medicine

## 2022-10-17 NOTE — Patient Instructions (Addendum)
      Medications changes include :   none      Return in about 6 months (around 04/19/2023) for follow up.

## 2022-10-17 NOTE — Progress Notes (Signed)
++    Subjective:    Patient ID: Billy Fernandez, male    DOB: 10-01-1951, 71 y.o.   MRN: [id number]     HPI Billy Fernandez is here for follow up of his chronic medical problems, including mild CAD, htn, DM, hld  He is taking all of his medications as prescribed.  Saw Cardio recently and had labs.  His a1c is 7.4%.   Afib is new since he was here last - on eliquis.   Prescribed him farxiga 10 mg daily - he has not started it-its very expensive and wanted to know if he truly needed it.   Once he was prescribed with A-fib he made significant changes in his lifestyle.  Losing weight.  Sugars are better controlled at home.  Eating no sugars and eating more healthy.  He is walking 2 miles every day.  He feels better overall.  Has decreased coffee intake.    Medications and allergies reviewed with patient and updated if appropriate.  Current Outpatient Medications on File Prior to Visit  Medication Sig Dispense Refill   apixaban (ELIQUIS) 5 MG TABS tablet Take 1 tablet (5 mg total) by mouth 2 (two) times daily. 60 tablet 3   atorvastatin (LIPITOR) 20 MG tablet TAKE 1 TABLET BY MOUTH EVERY DAY 90 tablet 2   Blood Glucose Calibration (ONETOUCH VERIO) SOLN Use as directed 3 each 3   Blood Glucose Monitoring Suppl (ONETOUCH VERIO REFLECT) w/Device KIT USE TO CHECK BLOOD GLUCOSE DAILY 1 kit 0   calcium carbonate (TUMS) 500 MG chewable tablet      carvedilol (COREG) 6.25 MG tablet TAKE 1 TABLET BY MOUTH TWICE A DAY 180 tablet 2   CINNAMON PO Take 1,000 mg by mouth daily.     dapagliflozin propanediol (FARXIGA) 10 MG TABS tablet Take 1 tablet (10 mg total) by mouth daily before breakfast. 90 tablet 1   fish oil-omega-3 fatty acids 1000 MG capsule Take 2 g by mouth daily.     Lancets (FREESTYLE) lancets Use to check blood sugars. E11.9 100 each 1   losartan-hydrochlorothiazide (HYZAAR) 100-25 MG tablet TAKE 1 TABLET BY MOUTH EVERY DAY 90 tablet 2   Magnesium 250 MG TABS Take by mouth daily.      metFORMIN (GLUCOPHAGE) 1000 MG tablet Take 1 tablet (1,000 mg total) by mouth 2 (two) times daily with a meal. 180 tablet 2   OneTouch Delica Lancets 12Y MISC Use to check blood sugars once a day 100 each 3   ONETOUCH VERIO test strip 1 EACH BY OTHER ROUTE DAILY. 100 strip 2   No current facility-administered medications on file prior to visit.     Review of Systems  Constitutional:  Negative for fever.  Respiratory:  Negative for cough, shortness of breath and wheezing.   Cardiovascular:  Positive for palpitations. Negative for chest pain and leg swelling.  Neurological:  Negative for light-headedness and headaches.       Objective:   Vitals:   10/18/22 0904  BP: 110/72  Pulse: 60  Temp: 98 F (36.7 C)  SpO2: 98%   BP Readings from Last 3 Encounters:  10/18/22 110/72  10/11/22 124/79  04/17/22 120/82   Wt Readings from Last 3 Encounters:  10/18/22 193 lb (87.5 kg)  10/11/22 202 lb 9.6 oz (91.9 kg)  04/17/22 203 lb (92.1 kg)   Body mass index is 27.69 kg/m.    Physical Exam Constitutional:      General: He is not in acute distress.  Appearance: Normal appearance. He is not ill-appearing.  HENT:     Head: Normocephalic and atraumatic.  Eyes:     Conjunctiva/sclera: Conjunctivae normal.  Cardiovascular:     Rate and Rhythm: Normal rate and regular rhythm.     Heart sounds: Normal heart sounds. No murmur heard. Pulmonary:     Effort: Pulmonary effort is normal. No respiratory distress.     Breath sounds: Normal breath sounds. No wheezing or rales.  Musculoskeletal:     Right lower leg: No edema.     Left lower leg: No edema.  Skin:    General: Skin is warm and dry.     Findings: No rash.  Neurological:     Mental Status: He is alert. Mental status is at baseline.  Psychiatric:        Mood and Affect: Mood normal.        Lab Results  Component Value Date   WBC 6.3 10/14/2022   HGB 15.9 10/14/2022   HCT 46.5 10/14/2022   PLT 227.0 10/14/2022    GLUCOSE 117 (H) 10/14/2022   CHOL 120 10/14/2022   TRIG 125.0 10/14/2022   HDL 45.70 10/14/2022   LDLCALC 50 10/14/2022   ALT 21 10/14/2022   AST 21 10/14/2022   NA 137 10/14/2022   K 3.9 10/14/2022   CL 101 10/14/2022   CREATININE 1.08 10/14/2022   BUN 22 10/14/2022   CO2 29 10/14/2022   TSH 0.99 10/14/2022   PSA 1.25 04/17/2022   HGBA1C 7.4 (H) 10/14/2022   MICROALBUR <0.7 04/17/2022     Assessment & Plan:    See Problem List for Assessment and Plan of chronic medical problems.

## 2022-10-18 ENCOUNTER — Ambulatory Visit (INDEPENDENT_AMBULATORY_CARE_PROVIDER_SITE_OTHER): Payer: Medicare HMO | Admitting: Internal Medicine

## 2022-10-18 VITALS — BP 110/72 | HR 60 | Temp 98.0°F | Ht 70.0 in | Wt 193.0 lb

## 2022-10-18 DIAGNOSIS — E782 Mixed hyperlipidemia: Secondary | ICD-10-CM

## 2022-10-18 DIAGNOSIS — R931 Abnormal findings on diagnostic imaging of heart and coronary circulation: Secondary | ICD-10-CM

## 2022-10-18 DIAGNOSIS — E119 Type 2 diabetes mellitus without complications: Secondary | ICD-10-CM

## 2022-10-18 DIAGNOSIS — I480 Paroxysmal atrial fibrillation: Secondary | ICD-10-CM

## 2022-10-18 DIAGNOSIS — I10 Essential (primary) hypertension: Secondary | ICD-10-CM | POA: Diagnosis not present

## 2022-10-18 DIAGNOSIS — J8410 Pulmonary fibrosis, unspecified: Secondary | ICD-10-CM

## 2022-10-18 DIAGNOSIS — J984 Other disorders of lung: Secondary | ICD-10-CM

## 2022-10-18 NOTE — Assessment & Plan Note (Signed)
Chronic Stable Benign No follow-up needed

## 2022-10-18 NOTE — Assessment & Plan Note (Signed)
Chronic Regular exercise and healthy diet encouraged Reviewed recent lipid panel Continue atorvastatin 20 mg daily  Lab Results  Component Value Date   LDLCALC 50 10/14/2022

## 2022-10-18 NOTE — Assessment & Plan Note (Signed)
Chronic CT coronary calcium score of 129 Blood pressure well controlled Continue atorvastatin 20 mg daily On Eliquis 5 mg twice daily for A-fib

## 2022-10-18 NOTE — Assessment & Plan Note (Addendum)
Chronic Lab Results  Component Value Date   HGBA1C 7.4 (H) 10/14/2022   Sugars not ideally controlled Continue metformin 1000 mg twice daily Prescribed Farxiga, but co-pay is high so he has not taken it or started it-would like to hold off given cost, which I think is acceptable.  I think his lifestyle changes will make a big difference and get his sugars controlled. Can reevaluate next year to see if medication is less expensive because of the benefit of the medication Stressed diabetic diet, regular exercise Weight loss encouraged

## 2022-10-18 NOTE — Assessment & Plan Note (Addendum)
Chronic Blood pressure well controlled CMP reviewed Continue Coreg 6.25 mg twice daily, losartan-HCT 100-25 mg daily Discussed that with his continued lifestyle changes we may be able to decrease his losartan-HCTZ

## 2022-10-18 NOTE — Assessment & Plan Note (Signed)
New since his last visit with me Following with cardiology On Eliquis 5 mg twice daily, Coreg 6.25 mg twice daily

## 2022-10-22 IMAGING — CT CT CHEST W/O CM
2 of 4 series · 15 of 36 positions shown, 18 images · non-contrast
Comparison: 04/19/2021

CLINICAL DATA: 2 mm right middle lobe pulmonary nodule



[Series 2: thorax · axial · 0.79mm/px · z∈[-376,-90]mm · 12 of 169 slices shown, 15 images]
[im 13/169  mediastinal]
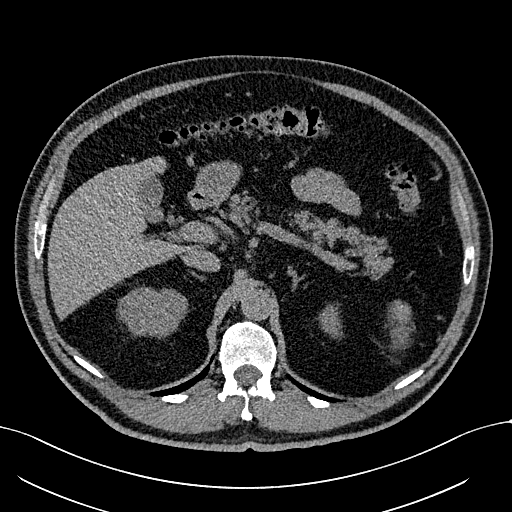
[im 13/169  lung]
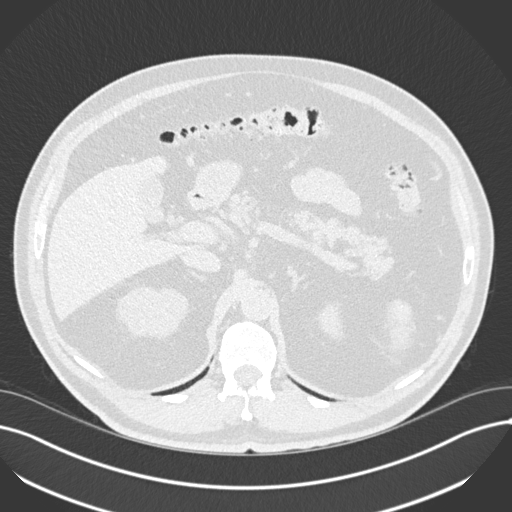
[im 26/169  lung]
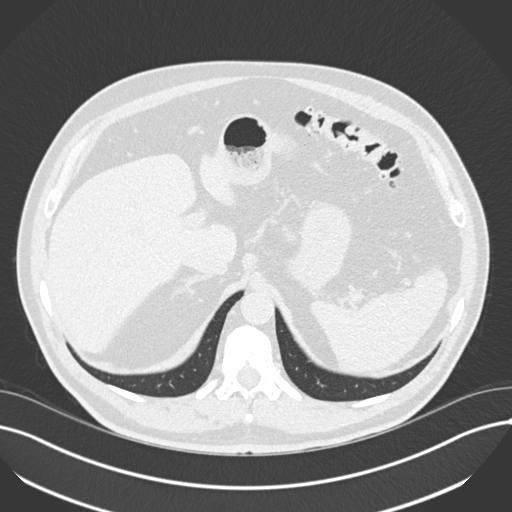
[im 39/169  lung]
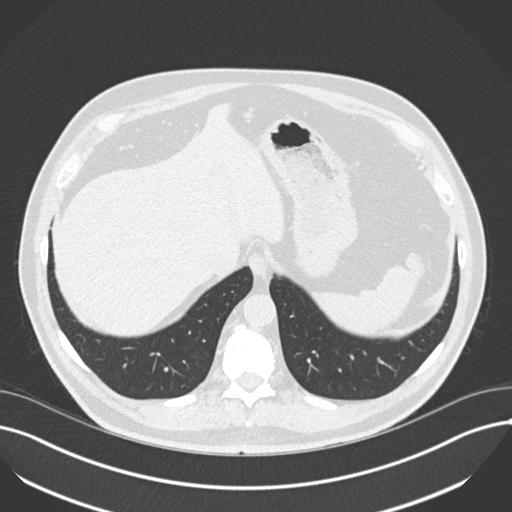
[im 52/169  lung]
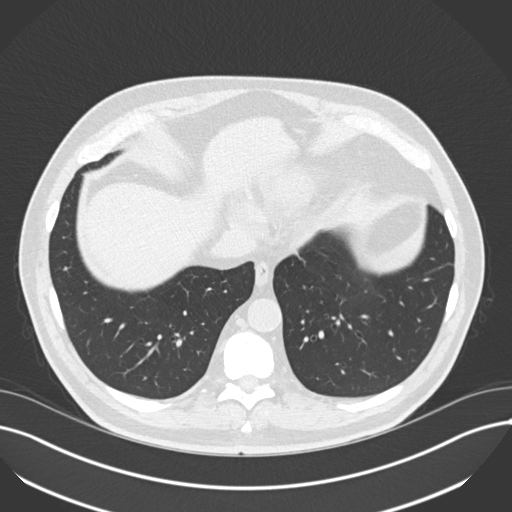
[im 65/169  mediastinal]
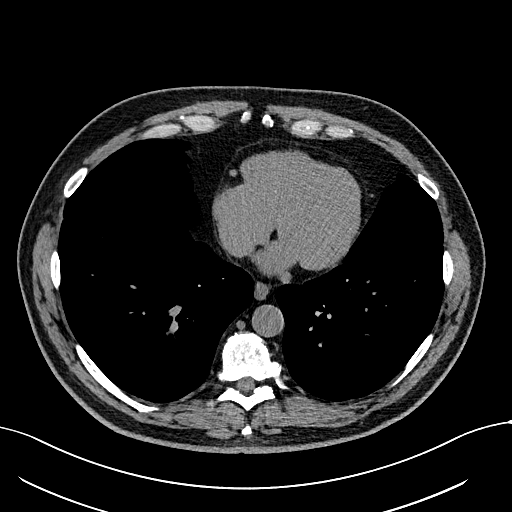
[im 65/169  lung]
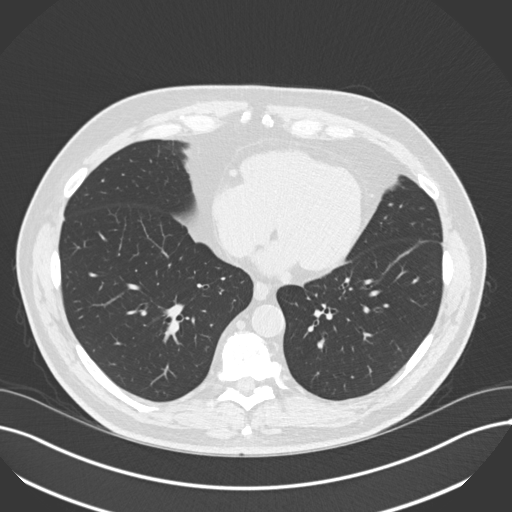
[im 78/169  lung]
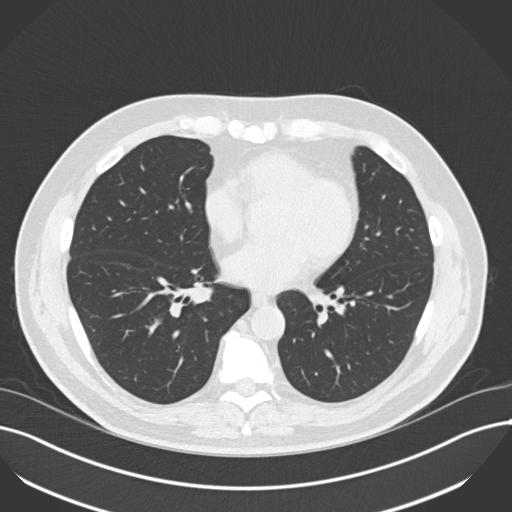
[im 91/169  lung]
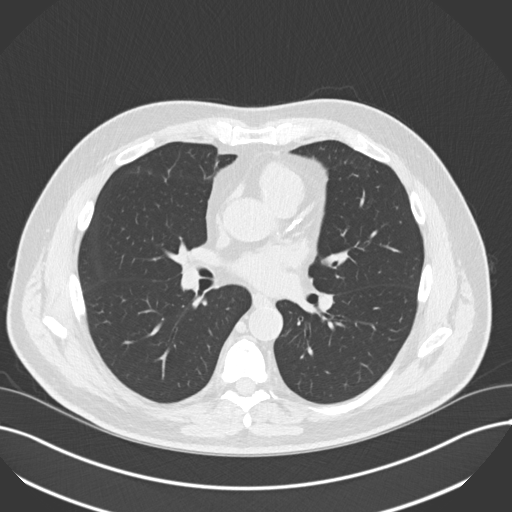
[im 104/169  lung]
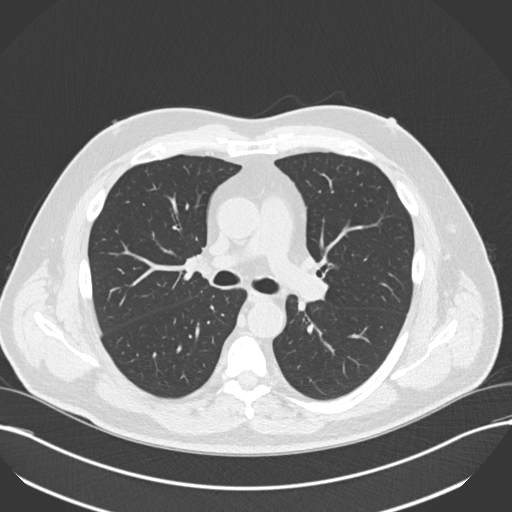
[im 117/169  mediastinal]
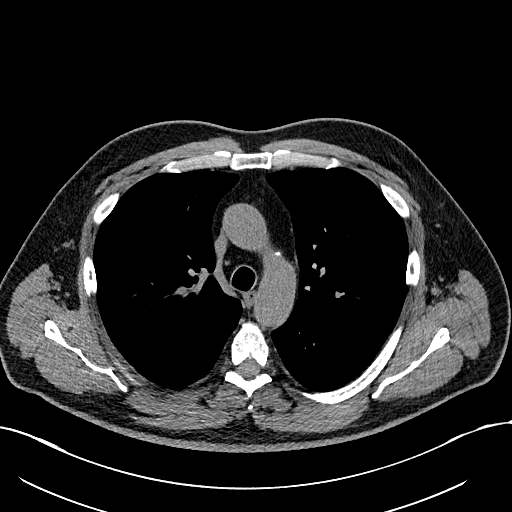
[im 117/169  lung]
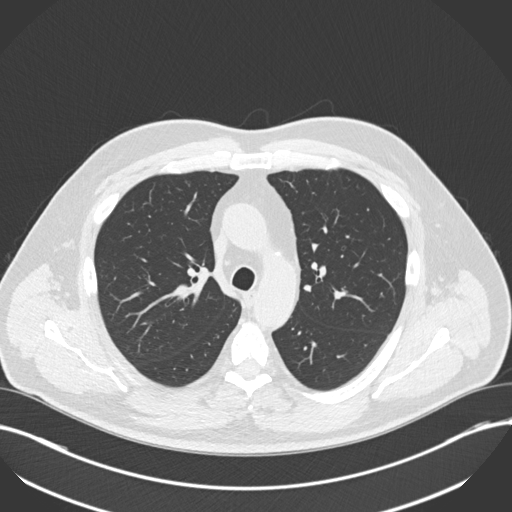
[im 130/169  lung]
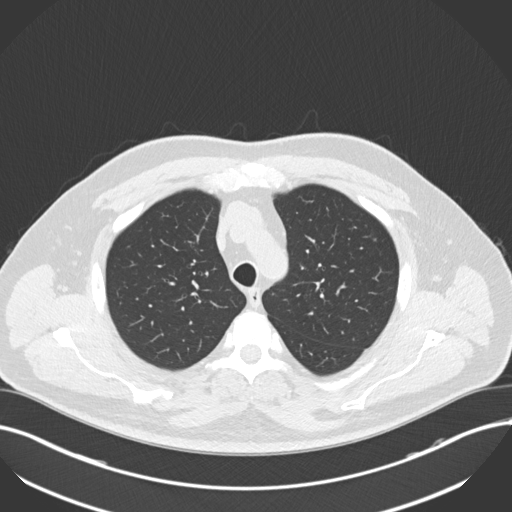
[im 143/169  lung]
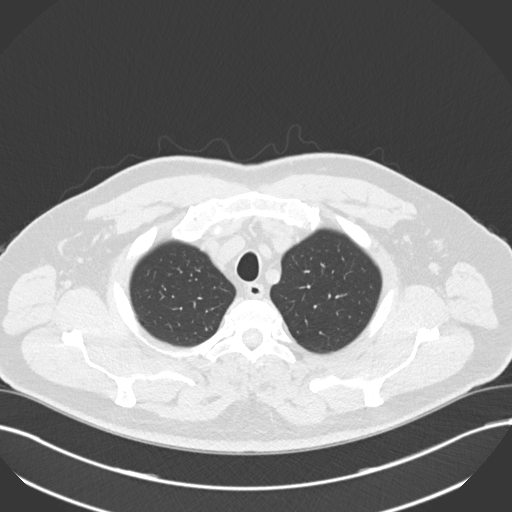
[im 156/169  lung]
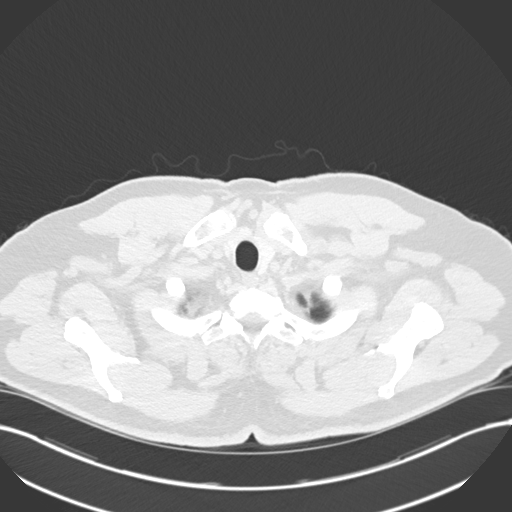

[Series 602: cor2 · coronal · 0.79mm/px · 3 of 136 slices shown]
[im 28/136  lung]
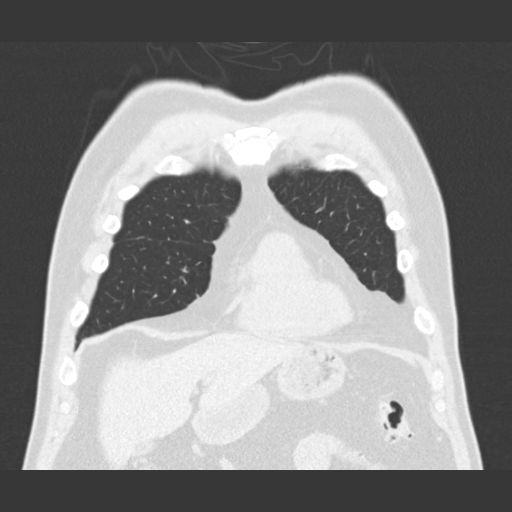
[im 55/136  lung]
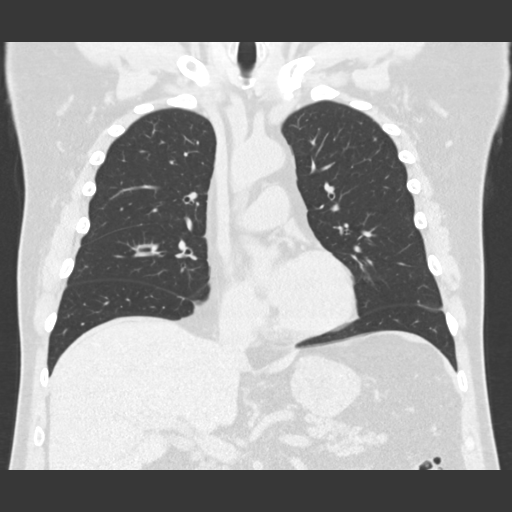
[im 82/136  lung]
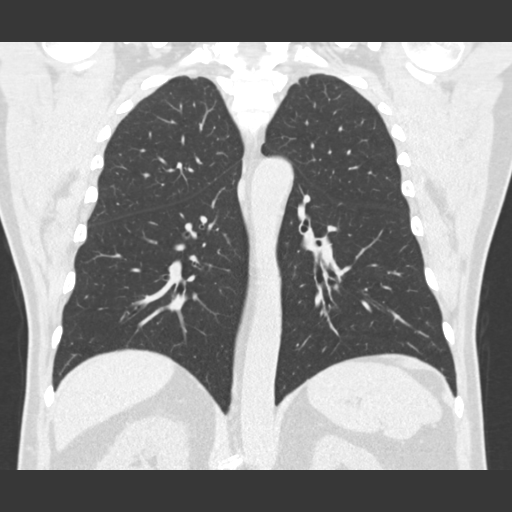

[15 of 36 positions shown; findings below may reference images not displayed]

FINDINGS: Cardiovascular: Unenhanced imaging of the heart is unremarkable
without pericardial effusion. Normal caliber of the thoracic aorta.
Stable atherosclerosis of the aorta and coronary vasculature.
Evaluation of the vascular lumen is limited without IV contrast.

Mediastinum/Nodes: No enlarged mediastinal or axillary lymph nodes.
Thyroid gland, trachea, and esophagus demonstrate no significant
findings.

Lungs/Pleura: No acute airspace disease, effusion, or pneumothorax.
The central airways are widely patent. The 2 mm right middle lobe
pulmonary nodule seen previously is calcified, consistent with
benign granuloma, reference image 89/3. There is also a 2 mm benign
granuloma within the left lower lobe image 94/3, stable since prior
study. No further imaging workup is required. No new pulmonary
nodules.

Upper Abdomen: No acute abnormality.

Musculoskeletal: No acute or destructive bony lesions. Reconstructed
images demonstrate no additional findings.
IMPRESSION: 1. Benign 2 mm calcified granulomas within the right middle and left
lower lobe as above. No imaging follow-up is required.
2. No acute intrathoracic process.
3. Aortic Atherosclerosis ([id number]). Coronary artery
atherosclerosis.

## 2022-10-27 ENCOUNTER — Encounter: Payer: Self-pay | Admitting: Cardiology

## 2022-10-28 DIAGNOSIS — Z0100 Encounter for examination of eyes and vision without abnormal findings: Secondary | ICD-10-CM | POA: Diagnosis not present

## 2022-10-28 NOTE — Telephone Encounter (Signed)
From pt

## 2022-11-01 ENCOUNTER — Ambulatory Visit: Payer: Medicare HMO

## 2022-11-01 DIAGNOSIS — R0609 Other forms of dyspnea: Secondary | ICD-10-CM

## 2022-11-01 DIAGNOSIS — I480 Paroxysmal atrial fibrillation: Secondary | ICD-10-CM | POA: Diagnosis not present

## 2022-11-01 NOTE — Telephone Encounter (Signed)
From pt

## 2022-11-04 ENCOUNTER — Ambulatory Visit: Payer: Medicare HMO

## 2022-11-04 ENCOUNTER — Other Ambulatory Visit: Payer: Medicare HMO

## 2022-11-04 DIAGNOSIS — R931 Abnormal findings on diagnostic imaging of heart and coronary circulation: Secondary | ICD-10-CM | POA: Diagnosis not present

## 2022-11-04 DIAGNOSIS — R0609 Other forms of dyspnea: Secondary | ICD-10-CM

## 2022-11-04 DIAGNOSIS — I480 Paroxysmal atrial fibrillation: Secondary | ICD-10-CM

## 2022-11-04 LAB — PCV MYOCARDIAL PERFUSION WO LEXISCAN
Angina Index: 0
ST Depression (mm): 2 mm

## 2022-11-05 ENCOUNTER — Encounter: Payer: Self-pay | Admitting: Cardiology

## 2022-11-05 NOTE — Telephone Encounter (Signed)
From pt

## 2022-11-06 DIAGNOSIS — I480 Paroxysmal atrial fibrillation: Secondary | ICD-10-CM | POA: Diagnosis not present

## 2022-11-08 ENCOUNTER — Telehealth: Payer: Self-pay | Admitting: Internal Medicine

## 2022-11-08 ENCOUNTER — Encounter: Payer: Self-pay | Admitting: Internal Medicine

## 2022-11-08 DIAGNOSIS — H524 Presbyopia: Secondary | ICD-10-CM | POA: Diagnosis not present

## 2022-11-08 DIAGNOSIS — E119 Type 2 diabetes mellitus without complications: Secondary | ICD-10-CM | POA: Diagnosis not present

## 2022-11-08 LAB — HM DIABETES EYE EXAM

## 2022-11-08 MED ORDER — LOSARTAN POTASSIUM 100 MG PO TABS: 100.0000 mg | ORAL_TABLET | Freq: Every day | ORAL | 1 refills | Status: DC

## 2022-11-08 NOTE — Telephone Encounter (Signed)
Spoke with spouse and info given.

## 2022-11-08 NOTE — Telephone Encounter (Signed)
Will change losartan-hydrochlorothiazide to just losartan  -   losartan '100mg'$  daily - sent to pharmacy

## 2022-11-08 NOTE — Telephone Encounter (Signed)
Triage nurse called for the patient and the patient just wanted to let Dr. Quay Burow know that since being dx with Afib, he has changed his lifestyle and is eating better and exercising so his blood pressure is staying low (normal) on its on. But when taking the blood pressure medication it makes his blood pressure even lower. Patient just wanted to make sure Dr. Quay Burow was aware.

## 2022-11-08 NOTE — Telephone Encounter (Signed)
Pt and wife called concerning MyChart message they sent to PCP; copy/pasted below:  Good Morning  My blood Pressures have been low since I've totally changed my eating. Blood sugars much better, weight reducing, and blood pressure in morning prior to taking my morning meds are lowering.  This morning 3 hours after I took my meds I felt like my BP was low so I retook my pressure. At first it was 88/63 with pulse of 103 ( I  feeling palpitations this morning) and I retook it and it was 109/53 with pulse of 99.  This weeks morning vitals before pills 104/69. BS 103 111/66 BS 98 116/70 BS 95 109/72 BS 101 118/75 BS 98 109/53 BS 102 Weight is Down to 189.6 My question is what are the parameters I should follow if my morning blood pressure is low Is there any change in my hypertension meds? Thank You Billy Fernandez   Pt asking if pt medications should be altered whenever this occurrs.  TRANSFERRED TO [facility].

## 2022-11-12 ENCOUNTER — Encounter: Payer: Self-pay | Admitting: Neurology

## 2022-11-12 ENCOUNTER — Ambulatory Visit (INDEPENDENT_AMBULATORY_CARE_PROVIDER_SITE_OTHER): Payer: Medicare HMO | Admitting: Neurology

## 2022-11-12 ENCOUNTER — Encounter: Payer: Self-pay | Admitting: Internal Medicine

## 2022-11-12 VITALS — BP 126/78 | HR 52 | Ht 70.0 in | Wt 197.6 lb

## 2022-11-12 DIAGNOSIS — R001 Bradycardia, unspecified: Secondary | ICD-10-CM

## 2022-11-12 DIAGNOSIS — R002 Palpitations: Secondary | ICD-10-CM

## 2022-11-12 DIAGNOSIS — R351 Nocturia: Secondary | ICD-10-CM

## 2022-11-12 DIAGNOSIS — Z9189 Other specified personal risk factors, not elsewhere classified: Secondary | ICD-10-CM | POA: Diagnosis not present

## 2022-11-12 DIAGNOSIS — I480 Paroxysmal atrial fibrillation: Secondary | ICD-10-CM | POA: Diagnosis not present

## 2022-11-12 NOTE — Patient Instructions (Signed)

## 2022-11-12 NOTE — Progress Notes (Signed)
Subjective:    Patient ID: Billy Fernandez is a 71 y.o. male.  HPI    Star Age, MD, PhD [facility] [address] [place], [place] [address]  Dear Billy Fernandez,  I saw your patient, Billy Fernandez, upon your kind request, in my sleep clinic today for initial consultation of his sleep disorder, in particular, concern for underlying obstructive sleep apnea.  The patient is accompanied by his wife today.  As you know, Mr. [name] is a 71 year old male with an underlying medical history of hypertension, hyperlipidemia, type 2 diabetes, cervical radiculopathy, coronary calcification, paroxysmal A-fib and overweight state, who reports no significant sleep related symptoms other than occasional sleep disruption from nocturia, he is noted to have mild, intermittent snoring per wife's report.  He has been diagnosed recently with A-fib and blood pressure has been fluctuating but has improved since he was started on new medications.  He has had palpitations in the past, recalls that at times of stress he has had palpitations, particularly when he went through his divorce.  He has a follow-up with you soon, has had cardiac work-up including Zio patch, echocardiogram, Lexiscan.  I reviewed your office note from 10/11/2022.  His Epworth sleepiness score is 4 out of 24, fatigue severity score is 13 out of 63.  He wakes up fairly well rested, he has a bedtime of around 9 PM and rise time around 5 or 6 AM.  He is retired, he used to work as a Chief Strategy Officer.  He lives with his wife, they have no pets in the household.  He has reduced his caffeine intake from 3 to 4 cups of coffee per day to 1 cup of coffee per day.  He drinks tea occasionally with a meal but not daily.  He denies recurrent nocturnal or morning headaches.  He has nocturia 1 time or less than that on some nights.  He quit smoking in the late 90s.  He drinks alcohol occasionally, once or twice a month.  They do  have a TV in the bedroom but watches very little, turn it off before falling asleep.  He is pursuing some lifestyle changes, he has lost about 10 pounds in the past month.  He has no family history of sleep apnea.  His Past Medical History Is Significant For: Past Medical History:  Diagnosis Date   Cervical radiculopathy at C6 04/14/2020   Essential hypertension, benign 05/28/2013   Hyperlipidemia 06/29/2013   Type 2 diabetes mellitus 07/01/2014    His Past Surgical History Is Significant For: History reviewed. No pertinent surgical history.  His Family History Is Significant For: Family History  Problem Relation Age of Onset   Breast cancer Mother    Cancer Mother    Diabetes Mother    Heart disease Father    Hyperlipidemia Father    Hypertension Father    Drug abuse Sister    Diabetes Brother    Heart disease Brother        cabg   Dementia Paternal Grandmother        Possible Alzheimer's disease   Alcohol abuse Neg Hx    COPD Neg Hx    Depression Neg Hx    Early death Neg Hx    Hearing loss Neg Hx    Kidney disease Neg Hx    Stroke Neg Hx     His Social History Is Significant For: Social History   Socioeconomic History   Marital status: Married  Spouse name: Not on file   Number of children: 2   Years of education: 63   Highest education level: High school graduate  Occupational History   Occupation: Retired  Tobacco Use   Smoking status: Former    Packs/day: 0.50    Years: 25.00    Total pack years: 12.50    Types: Cigarettes    Quit date: 05/07/1981    Years since quitting: 41.5   Smokeless tobacco: Never  Vaping Use   Vaping Use: Never used  Substance and Sexual Activity   Alcohol use: Yes    Alcohol/week: 1.0 standard drink of alcohol    Types: 1 Cans of beer per week    Comment: occasionally/rarely   Drug use: Not Currently   Sexual activity: Yes  Other Topics Concern   Not on file  Social History Narrative   Lives in 2 story home with wife    They enjoy travelling and exercising - very independent and active   Social Determinants of Health   Financial Resource Strain: Low Risk  (04/05/2022)   Overall Financial Resource Strain (CARDIA)    Difficulty of Paying Living Expenses: Not hard at all  Food Insecurity: No Food Insecurity (04/05/2022)   Hunger Vital Sign    Worried About Running Out of Food in the Last Year: Never true    Ran Out of Food in the Last Year: Never true  Transportation Needs: No Transportation Needs (04/05/2022)   PRAPARE - Hydrologist (Medical): No    Lack of Transportation (Non-Medical): No  Physical Activity: Sufficiently Active (04/05/2022)   Exercise Vital Sign    Days of Exercise per Week: 7 days    Minutes of Exercise per Session: 30 min  Stress: No Stress Concern Present (04/05/2022)   [name]    Feeling of Stress : Not at all  Social Connections: [place] (04/05/2022)   Social Connection and Isolation Panel [NHANES]    Frequency of Communication with Friends and Family: More than three times a week    Frequency of Social Gatherings with Friends and Family: More than three times a week    Attends Religious Services: More than 4 times per year    Active Member of [employer] or Organizations: Yes    Attends Music therapist: More than 4 times per year    Marital Status: Married    His Allergies Are:  No Known Allergies:   His Current Medications Are:  Outpatient Encounter Medications as of 11/12/2022  Medication Sig   apixaban (ELIQUIS) 5 MG TABS tablet Take 1 tablet (5 mg total) by mouth 2 (two) times daily.   atorvastatin (LIPITOR) 20 MG tablet TAKE 1 TABLET BY MOUTH EVERY DAY   Blood Glucose Calibration (ONETOUCH VERIO) SOLN Use as directed   Blood Glucose Monitoring Suppl (ONETOUCH VERIO REFLECT) w/Device KIT USE TO CHECK BLOOD GLUCOSE DAILY   calcium carbonate (TUMS) 500 MG  chewable tablet    carvedilol (COREG) 6.25 MG tablet TAKE 1 TABLET BY MOUTH TWICE A DAY   CINNAMON PO Take 1,000 mg by mouth daily.   fish oil-omega-3 fatty acids 1000 MG capsule Take 2 g by mouth daily.   Lancets (FREESTYLE) lancets Use to check blood sugars. E11.9   losartan (COZAAR) 100 MG tablet Take 1 tablet (100 mg total) by mouth daily.   Magnesium 250 MG TABS Take by mouth daily.   metFORMIN (GLUCOPHAGE) 1000  MG tablet Take 1 tablet (1,000 mg total) by mouth 2 (two) times daily with a meal.   OneTouch Delica Lancets 73Z MISC Use to check blood sugars once a day   ONETOUCH VERIO test strip 1 EACH BY OTHER ROUTE DAILY.   No facility-administered encounter medications on file as of 11/12/2022.  :   Review of Systems:  Out of a complete 14 point review of systems, all are reviewed and negative with the exception of these symptoms as listed below:  Review of Systems  Neurological:        Rm 9, Pt is here with Wife. pt was feeling fatigue. No snoring. BP controlled with medication.Pt said that bp is doing better with new medication.  ESS 4 & 13    Objective:  Neurological Exam  Physical Exam Physical Examination:   Vitals:   11/12/22 1028  BP: 126/78  Pulse: (!) 52    General Examination: The patient is a very pleasant 71 y.o. male in no acute distress. He appears well-developed and well-nourished and well groomed.   HEENT: Normocephalic, atraumatic, pupils are equal, round and reactive to light, corrective eyeglasses in place.  Extraocular tracking is good without limitation to gaze excursion or nystagmus noted. Hearing is grossly intact. Face is symmetric with normal facial animation. Speech is clear with no dysarthria noted. There is no hypophonia. There is no lip, neck/head, jaw or voice tremor. Neck is supple with full range of passive and active motion. There are no carotid bruits on auscultation. Oropharynx exam reveals: No significant mouth dryness, good dental hygiene,  moderate airway crowding secondary to tonsillar size of about 1+, wider uvula.  Mallampati class II.  Neck circumference of 16-3/8 inches, mild overbite.  Tongue protrudes centrally and palate elevates symmetrically.   Chest: Clear to auscultation without wheezing, rhonchi or crackles noted.  Heart: S1+S2+0, regular and normal without murmurs, rubs or gallops noted.   Abdomen: Soft, non-tender and non-distended.  Extremities: There is no pitting edema in the distal lower extremities bilaterally.   Skin: Warm and dry without trophic changes noted.   Musculoskeletal: exam reveals no obvious joint deformities.   Neurologically:  Mental status: The patient is awake, alert and oriented in all 4 spheres. His immediate and remote memory, attention, language skills and fund of knowledge are appropriate. There is no evidence of aphasia, agnosia, apraxia or anomia. Speech is clear with normal prosody and enunciation. Thought process is linear. Mood is normal and affect is normal.  Cranial nerves II - XII are as described above under HEENT exam.  Motor exam: Normal bulk, strength and tone is noted. There is no obvious action or resting tremor.  Fine motor skills and coordination: grossly intact.  Cerebellar testing: No dysmetria or intention tremor. There is no truncal or gait ataxia.  Sensory exam: intact to light touch in the upper and lower extremities.  Gait, station and balance: He stands easily. No veering to one side is noted. No leaning to one side is noted. Posture is age-appropriate and stance is narrow based. Gait shows normal stride length and normal pace. No problems turning are noted.   Assessment and Plan:  In summary, Romelo Sciandra is a very pleasant 71 y.o.-year old male with an underlying medical history of hypertension, hyperlipidemia, type 2 diabetes, cervical radiculopathy, coronary calcification, paroxysmal A-fib and overweight state, whose history and physical exam are  concerning for sleep disordered breathing, particularly underlying obstructive sleep apnea.   I had a long chat with  the patient and his wife about my findings and the diagnosis of sleep apnea, particularly OSA, its prognosis and treatment options. We talked about medical/conservative treatments, surgical interventions and non-pharmacological approaches for symptom control. I explained, in particular, the risks and ramifications of untreated moderate to severe OSA, especially with respect to developing cardiovascular disease down the road, including congestive heart failure (CHF), difficult to treat hypertension, cardiac arrhythmias (particularly A-fib), neurovascular complications including TIA, stroke and dementia. Even type 2 diabetes has, in part, been linked to untreated OSA. Symptoms of untreated OSA may include (but may not be limited to) daytime sleepiness, nocturia (i.e. frequent nighttime urination), memory problems, mood irritability and suboptimally controlled or worsening mood disorder such as depression and/or anxiety, lack of energy, lack of motivation, physical discomfort, as well as recurrent headaches, especially morning or nocturnal headaches. We talked about the importance of maintaining a healthy lifestyle and striving for healthy weight. In addition, we talked about the importance of striving for and maintaining good sleep hygiene. I recommended the following at this time: sleep study.  I outlined the differences between a laboratory attended sleep study which is considered more comprehensive and accurate over the option of a home sleep test (HST); the latter may lead to underestimation of sleep disordered breathing in some instances and does not help with diagnosing upper airway resistance syndrome and is not accurate enough to diagnose primary central sleep apnea typically. I explained the different sleep test procedures to the patient in detail and also outlined possible surgical and  non-surgical treatment options of OSA, including the use of a pressure airway pressure (PAP) device (ie CPAP, AutoPAP/APAP or BiPAP in certain circumstances), a custom-made dental device (aka oral appliance, which would require a referral to a specialist dentist or orthodontist typically, and is generally speaking not considered a good choice for patients with full dentures or edentulous state), upper airway surgical options, such as traditional UPPP (which is not considered a first-line treatment) or the Inspire device (hypoglossal nerve stimulator, which would involve a referral for consultation with an ENT surgeon, after careful selection, following inclusion criteria). I explained the PAP treatment option to the patient in detail, as this is generally considered first-line treatment.  The patient indicated that he would be willing to try PAP therapy, if the need arises. I explained the importance of being compliant with PAP treatment, not only for insurance purposes but primarily to improve patient's symptoms symptoms, and for the patient's long term health benefit, including to reduce His cardiovascular risks longer-term.    We will pick up our discussion about the next steps and treatment options after testing.  We will keep them posted as to the test results by phone call and/or MyChart messaging where possible.  We will plan to follow-up in sleep clinic accordingly as well.  I answered all their questions today and the patient and his wife were in agreement.   I encouraged them to call with any interim questions, concerns, problems or updates or email [place] through [place].  Generally speaking, sleep test authorizations may take up to 2 weeks, sometimes less, sometimes longer, the patient is encouraged to get in touch with [place] if they do not hear back from the sleep lab staff directly within the next 2 weeks.  Thank you very much for allowing me to participate in the care of this nice patient. If I can be  of any further assistance to you please do not hesitate to call me at [phone].  Sincerely,  Star Age, MD, PhD

## 2022-11-12 NOTE — Progress Notes (Deleted)
Subjective:    Patient ID: Billy Fernandez is a 71 y.o. male.  HPI {Common ambulatory SmartLinks:[id number]}  Review of Systems  Objective:  Neurological Exam  Physical Exam  Assessment:   ***  Plan:   ***

## 2022-11-15 ENCOUNTER — Encounter: Payer: Self-pay | Admitting: Neurology

## 2022-11-17 DIAGNOSIS — I480 Paroxysmal atrial fibrillation: Secondary | ICD-10-CM | POA: Diagnosis not present

## 2022-11-18 ENCOUNTER — Telehealth: Payer: Self-pay | Admitting: Neurology

## 2022-11-18 NOTE — Telephone Encounter (Signed)
[employer] medicare pending uploaded notes

## 2022-11-25 ENCOUNTER — Ambulatory Visit: Payer: Medicare HMO | Admitting: Cardiology

## 2022-11-25 ENCOUNTER — Encounter: Payer: Self-pay | Admitting: Cardiology

## 2022-11-25 VITALS — BP 132/86 | HR 57 | Resp 16 | Ht 70.0 in | Wt 196.6 lb

## 2022-11-25 DIAGNOSIS — R0609 Other forms of dyspnea: Secondary | ICD-10-CM

## 2022-11-25 DIAGNOSIS — E7800 Pure hypercholesterolemia, unspecified: Secondary | ICD-10-CM

## 2022-11-25 DIAGNOSIS — I480 Paroxysmal atrial fibrillation: Secondary | ICD-10-CM | POA: Diagnosis not present

## 2022-11-25 DIAGNOSIS — I10 Essential (primary) hypertension: Secondary | ICD-10-CM

## 2022-11-25 MED ORDER — HYDROCHLOROTHIAZIDE 12.5 MG PO CAPS: 12.5000 mg | ORAL_CAPSULE | ORAL | 0 refills | Status: DC

## 2022-11-25 MED ORDER — LOSARTAN POTASSIUM-HCTZ 100-12.5 MG PO TABS: 1.0000 | ORAL_TABLET | Freq: Every day | ORAL | 3 refills | Status: DC

## 2022-11-25 NOTE — Progress Notes (Signed)
Primary Physician/Referring:  Binnie Rail, MD  Patient ID: Billy Fernandez, male    DOB: 12/11/1951, 71 y.o.   MRN: [id number]  Chief Complaint  Patient presents with   Paroxysmal atrial fibrillation   DOE   Hyperlipidemia   Follow-up    6 weeks    HPI:    Billy Fernandez  is a 71 y.o. Caucasian male patient with hypertension, hyperlipidemia and type 2 diabetes mellitus and chronic palpitations, seen by me in September 2023 when he presented with fatigue and palpitations and his smart watch revealing atrial fibrillation.  Patient states that since last office visit, he has had brief episodes of atrial fibrillation, longest lasted about 12 hours greater than a month ago and has not had any recurrence.  States that his fatigue and dyspnea has resolved.  His wife present.  Past Medical History:  Diagnosis Date   Cervical radiculopathy at C6 04/14/2020   Essential hypertension, benign 05/28/2013   Hyperlipidemia 06/29/2013   Type 2 diabetes mellitus 07/01/2014     Social History   Tobacco Use   Smoking status: Former    Packs/day: 0.50    Years: 25.00    Total pack years: 12.50    Types: Cigarettes    Quit date: 05/07/1981    Years since quitting: 41.5   Smokeless tobacco: Never  Substance Use Topics   Alcohol use: Yes    Alcohol/week: 1.0 standard drink of alcohol    Types: 1 Cans of beer per week    Comment: occasionally/rarely   Marital Status: Married  ROS  Review of Systems  Constitutional: Negative for malaise/fatigue.  Cardiovascular:  Positive for palpitations. Negative for chest pain, dyspnea on exertion and leg swelling.  Gastrointestinal:  Negative for melena.   Objective  Blood pressure 132/86, pulse (!) 57, resp. rate 16, height _0  (1.778 m), weight 196 lb 9.6 oz (89.2 kg), SpO2 97 %. Body mass index is 28.21 kg/m.     11/25/2022   11:00 AM 11/12/2022   10:28 AM 10/18/2022    9:04 AM  Vitals with BMI  Height _1  _2  _3   Weight 196 lbs  10 oz 197 lbs 10 oz 193 lbs  BMI 28.21 71.06 26.94  Systolic 854 627 035  Diastolic 86 78 72  Pulse 57 52 60     Physical Exam  Neck: No JVD present.  Cardiovascular: Regular rhythm, normal heart sounds, intact distal pulses and normal pulses. Exam reveals no gallop.  No murmur heard. Pulmonary/Chest: Effort normal and breath sounds normal.  Abdominal: Soft. Bowel sounds are normal.  Musculoskeletal:        General: No edema.     Laboratory examination:   Lab Results  Component Value Date   NA 137 10/14/2022   K 3.9 10/14/2022   CO2 29 10/14/2022   GLUCOSE 117 (H) 10/14/2022   BUN 22 10/14/2022   CREATININE 1.08 10/14/2022   CALCIUM 9.5 10/14/2022   GFRNONAA 67 12/25/2018   Lab Results  Component Value Date   ALT 21 10/14/2022   AST 21 10/14/2022   ALKPHOS 38 (L) 10/14/2022   BILITOT 1.0 10/14/2022      Latest Ref Rng & Units 10/14/2022    9:19 AM 04/17/2022    8:53 AM 04/16/2021   10:54 AM  CBC  WBC 4.0 - 10.5 K/uL 6.3  5.8  5.9   Hemoglobin 13.0 - 17.0 g/dL 15.9  15.4  15.8   Hematocrit 39.0 - 52.0 %  46.5  44.5  45.2   Platelets 150.0 - 400.0 K/uL 227.0  218.0  202.0    Lab Results  Component Value Date   CHOL 120 10/14/2022   HDL 45.70 10/14/2022   LDLCALC 50 10/14/2022   TRIG 125.0 10/14/2022   CHOLHDL 3 10/14/2022    HEMOGLOBIN A1C Lab Results  Component Value Date   HGBA1C 7.4 (H) 10/14/2022   TSH Recent Labs    04/17/22 0853 10/14/22 0919  TSH 1.74 0.99   Allergies  No Known Allergies    Final Medications at End of Visit    Current Outpatient Medications:    apixaban (ELIQUIS) 5 MG TABS tablet, Take 1 tablet (5 mg total) by mouth 2 (two) times daily., Disp: 60 tablet, Rfl: 3   atorvastatin (LIPITOR) 20 MG tablet, TAKE 1 TABLET BY MOUTH EVERY DAY, Disp: 90 tablet, Rfl: 2   Blood Glucose Calibration (ONETOUCH VERIO) SOLN, Use as directed, Disp: 3 each, Rfl: 3   Blood Glucose Monitoring Suppl (ONETOUCH VERIO REFLECT) w/Device KIT, USE TO  CHECK BLOOD GLUCOSE DAILY, Disp: 1 kit, Rfl: 0   calcium carbonate (TUMS) 500 MG chewable tablet, Chew 2 tablets by mouth at bedtime., Disp: , Rfl:    carvedilol (COREG) 6.25 MG tablet, TAKE 1 TABLET BY MOUTH TWICE A DAY, Disp: 180 tablet, Rfl: 2   CINNAMON PO, Take 1,000 mg by mouth daily., Disp: , Rfl:    fish oil-omega-3 fatty acids 1000 MG capsule, Take 2 g by mouth daily., Disp: , Rfl:    hydrochlorothiazide (MICROZIDE) 12.5 MG capsule, Take 1 capsule (12.5 mg total) by mouth every morning., Disp: 90 capsule, Rfl: 0   Lancets (FREESTYLE) lancets, Use to check blood sugars. E11.9, Disp: 100 each, Rfl: 1   losartan-hydrochlorothiazide (HYZAAR) 100-12.5 MG tablet, Take 1 tablet by mouth daily., Disp: 90 tablet, Rfl: 3   Magnesium 250 MG TABS, Take by mouth daily., Disp: , Rfl:    metFORMIN (GLUCOPHAGE) 1000 MG tablet, Take 1 tablet (1,000 mg total) by mouth 2 (two) times daily with a meal., Disp: 180 tablet, Rfl: 2   OneTouch Delica Lancets 29V MISC, Use to check blood sugars once a day, Disp: 100 each, Rfl: 3   ONETOUCH VERIO test strip, 1 EACH BY OTHER ROUTE DAILY., Disp: 100 strip, Rfl: 2    Radiology:   Coronary calcium score 04/19/2021: Coronary calcium score of 129. This was 18 percentile for age-, race-, and sex-matched controls. LM: 0 LAD: 129 LCx: 0 RCA: 0. Visualized cardiac and noncardiac structures within normal limits.  Cardiac Studies:   Zio Patch Extended out patient EKG monitoring 13 days starting 10/14/2022: Predominant Rhythm :        Normal sinus rhythm min HR: 43. Max HR 12/23/2026 Atrial arrhythmias:               Brief atrial tachycardia, sustained atrial fibrillation for 3 hours and 29 minutes. Atrial fibrillation:                  2% atrial fibrillation burden, symptomatic with palpitations. Ventricular arrhythmias:      Occasional PVCs. PVC Burden <1 Heart Block:                        None Symptoms:                          Correlated with atrial fibrillation,  brief atrial tachycardia and  PVCs and PACs   PCV ECHOCARDIOGRAM COMPLETE 11/01/2022  Narrative Echocardiogram 11/01/2022: Left ventricle cavity is normal in size. Moderate concentric hypertrophy of the left ventricle. Normal global wall motion. Normal LV systolic function with EF 65%. Normal diastolic filling pattern. Mild tricuspid regurgitation. No evidence of pulmonary hypertension.    PCV MYOCARDIAL PERFUSION WO LEXISCAN 11/04/2022  Narrative Exercise nuclear stress test 11/04/2022: Myocardial perfusion is normal. Overall LV systolic function is normal without regional wall motion abnormalities. Stress LV EF: 63%. Abnormal ECG stress. The patient exercised for 6 minutes and 3 seconds of a Bruce protocol, achieving approximately 7.1 METs & 86% MPHR. Reduced exercise tolerance. Resting EKG demonstrated normal sinus rhythm. No ST-T wave abnormalities. Peak EKG revealed 2 mm horizontal ST depression and occasional premature ventricular contractions with ST changes persisting for >2 min into recovery. The blood pressure response was normal. The baseline blood pressure was 150/100 mmHg and increased to 160/98 mmHg. No previous exam available for comparison. Low risk.    EKG:   EKG 11/25/2022: Sinus bradycardia at the rate of 55 bpm, normal axis, poor R wave progression, low-voltage complexes.  Nonspecific T abnormality.  Single PAC.  Compared to 10/11/2022, no change.  Assessment     ICD-10-CM   1. Paroxysmal atrial fibrillation (HCC)  I48.0 EKG 12-Lead    2. Pure hypercholesterolemia  E78.00     3. Essential hypertension, benign  I10 losartan-hydrochlorothiazide (HYZAAR) 100-12.5 MG tablet    hydrochlorothiazide (MICROZIDE) 12.5 MG capsule      CHA2DS2-VASc Score is 4.  Yearly risk of stroke: 5% (A, HTN, DM, Vasc Dz).  Score of 1=0.6; 2=2.2; 3=3.2; 4=4.8; 5=7.2; 6=9.8; 7=>9.8) -(CHF; HTN; vasc disease DM,  Male = 1; Age <65 =0; 65-74 = 1,  >75 =2; stroke/embolism= 2).     Medications Discontinued During This Encounter  Medication Reason   losartan (COZAAR) 100 MG tablet Change in therapy     Meds ordered this encounter  Medications   losartan-hydrochlorothiazide (HYZAAR) 100-12.5 MG tablet    Sig: Take 1 tablet by mouth daily.    Dispense:  90 tablet    Refill:  3    Do not fill until patient calls (has Losartan 90 at home) will Rx HCTZ   hydrochlorothiazide (MICROZIDE) 12.5 MG capsule    Sig: Take 1 capsule (12.5 mg total) by mouth every morning.    Dispense:  90 capsule    Refill:  0    No refills as he will be converting to Losartan H   Orders Placed This Encounter  Procedures   EKG 12-Lead   Recommendations:   Billy Fernandez is a 71 y.o.  Caucasian male patient with hypertension, hyperlipidemia and type 2 diabetes mellitus and chronic palpitations, seen by me in September 2023 when he presented with fatigue and palpitations and his smart watch revealing atrial fibrillation.  1. Paroxysmal atrial fibrillation (HCC) Patient's atrial fibrillation burden is about 2% on the Zio patch.  However in view of markedly elevated CHA2DS2-VASc risk score, long-term anticoagulation is indicated.  Patient is willing to continue Eliquis.  Interactions with NSAIDs discussed. Sleep study has been scheduled for next week.  2. Pure hypercholesterolemia Lipids under excellent control.  This is especially in view of elevated coronary calcium score and diabetes status.  He is also made significant lifestyle changes and has been losing weight and blood sugars have been under excellent control and expect his A1c to improve.  3. Essential hypertension, benign Blood pressure was well-controlled when he  was on losartan HCT 100/25 mg, however due to low blood pressure it was changed over to plain losartan.  He wishes to go back on the losartan HCT but at 100/12.5 mg dose.  Rx sent for the same.    Adrian Prows, MD, [facility] 11/25/2022, 11:50 AM Office: [phone] Fax:  [phone] Pager: [phone]

## 2022-11-25 NOTE — Telephone Encounter (Signed)
[employer]- [employer] medicare Josem Kaufmann: [id number] (exp. 11/18/22 to 05/17/23)   Spoke to the patient Billy Fernandez and he is scheduled at [facility] for 11/27/22 at 8 pm.

## 2022-11-27 ENCOUNTER — Ambulatory Visit (INDEPENDENT_AMBULATORY_CARE_PROVIDER_SITE_OTHER): Payer: Medicare HMO | Admitting: Neurology

## 2022-11-27 DIAGNOSIS — Z9189 Other specified personal risk factors, not elsewhere classified: Secondary | ICD-10-CM

## 2022-11-27 DIAGNOSIS — R001 Bradycardia, unspecified: Secondary | ICD-10-CM

## 2022-11-27 DIAGNOSIS — G4733 Obstructive sleep apnea (adult) (pediatric): Secondary | ICD-10-CM | POA: Diagnosis not present

## 2022-11-27 DIAGNOSIS — R002 Palpitations: Secondary | ICD-10-CM

## 2022-11-27 DIAGNOSIS — G4720 Circadian rhythm sleep disorder, unspecified type: Secondary | ICD-10-CM

## 2022-11-27 DIAGNOSIS — I480 Paroxysmal atrial fibrillation: Secondary | ICD-10-CM

## 2022-11-27 DIAGNOSIS — R351 Nocturia: Secondary | ICD-10-CM

## 2022-11-27 DIAGNOSIS — G478 Other sleep disorders: Secondary | ICD-10-CM

## 2022-11-28 ENCOUNTER — Encounter: Payer: Self-pay | Admitting: Internal Medicine

## 2022-11-28 NOTE — Progress Notes (Signed)
Outside notes received. Information abstracted. Notes sent to scan.  

## 2022-12-04 NOTE — Procedures (Signed)
Physician Interpretation:     [facility] POLYSOMNOGRAPHY  INTERPRETATION REPORT   STUDY DATE:  11/27/2022     PATIENT NAME:  Billy Fernandez         DATE OF BIRTH:  1951/11/15  PATIENT ID:  [id number]    TYPE OF STUDY:    READING PHYSICIAN: Star Age, MD, PhD REFERRED BY: Dr. Einar Gip SCORING TECHNICIAN: Gaylyn Cheers, RPSGT  History: 71 year old male with an underlying medical history of hypertension, hyperlipidemia, type 2 diabetes, cervical radiculopathy, coronary calcification, paroxysmal A-fib and overweight state, who reports no significant sleep related symptoms other than occasional sleep disruption from nocturia, he is noted to have mild, intermittent snoring per wife's report. The Epworth Sleepiness Score is 4/24 points.  Height: 70 in Weight: 197 lb (BMI 28) Neck Size: 16 in  MEDICATIONS: Eliquis, Lipitor, Tums, Coreg, Cinnamon, fish oil, Cozaar, Magnesium, Glucophage TECHNICAL DESCRIPTION: A registered sleep technologist was in attendance for the duration of the recording.  Data collection, scoring, video monitoring, and reporting were performed in compliance with the AASM Manual for the Scoring of Sleep and Associated Events; (Hypopnea is scored based on the criteria listed in Section VIII D. 1b in the AASM Manual V2.6 using a 4% oxygen desaturation rule or Hypopnea is scored based on the criteria listed in Section VIII D. 1a in the AASM Manual V2.6 using 3% oxygen desaturation and /or arousal rule).   SLEEP CONTINUITY AND SLEEP ARCHITECTURE:  Lights-out was at 20:41: and lights-on at  04:49:, with a total recording time of 8 hours, 4 min. Total sleep time ( TST) was 191.5 minutes with a markedly decreased sleep efficiency at 39.6%.   BODY POSITION:  TST was divided  between the following sleep positions: 53.5% supine;  46.5% lateral;  0% prone. Duration of total sleep and percent of total sleep in their respective position is as follows: supine  102 minutes (54%), non-supine 89 minutes (46%); right 66 minutes (35%), left 22 minutes (12%), and prone 00 minutes (0%).  Total supine REM sleep time was 09 minutes (100% of total REM sleep).  Sleep latency was increased at 135.5 minutes.  REM sleep latency was markedly delayed at 277.0 minutes. Of the total sleep time, the percentage of stage N1 sleep was 28.5%, which is highly increased, stage N2 sleep was 67%, which is increased, stage N3 sleep was absent, and REM sleep was 4.7%, which is markedly reduced. Wake after sleep onset (WASO) time accounted for 157 minutes with moderate to severe sleep fragmentation noted.  RESPIRATORY MONITORING:   Based on CMS criteria (using a 4% oxygen desaturation rule for scoring hypopneas), there were 74 apneas (73 obstructive; 0 central; 1 mixed), and 22 hypopneas.  Apnea index was 23.2. Hypopnea index was 6.9. The apnea-hypopnea index was 30.1/hour overall (54.4 supine, 0 non-supine; 73.3 REM, 73.3 supine REM).  There were 0 respiratory effort-related arousals (RERAs).  The RERA index was 0 events/h. Total respiratory disturbance index (RDI) was 30.1 events/h. RDI results showed: supine RDI  54.4 /h; non-supine RDI 2.0 /h; REM RDI 73.3 /h, supine REM RDI 73.3 /h.   Based on AASM criteria (using a 3% oxygen desaturation and /or arousal rule for scoring hypopneas), there were 74 apneas (73 obstructive; 0 central; 1 mixed), and 29 hypopneas. Apnea index was 23.2. Hypopnea index was 9.1. The apnea-hypopnea index was 32.3 overall (58.5 supine, 0 non-supine; 73.3 REM, 73.3 supine REM).  There were 0 respiratory effort-related arousals (RERAs).  The RERA index  was 0 events/h. Total respiratory disturbance index (RDI) was 32.3 events/h. RDI results showed: supine RDI  58.5 /h; non-supine RDI 2.0 /h; REM RDI 73.3 /h, supine REM RDI 73.3 /h.  OXIMETRY: Oxyhemoglobin Saturation Nadir during sleep was at  79%) from a mean of 95%.  Of the Total sleep time (TST)   hypoxemia (=<88%)  was present for  1.6 minutes, or 0.9% of total sleep time.  LIMB MOVEMENTS: There were 0 periodic limb movements of sleep (0.0/hr), of which 0 (0.0/hr) were associated with an arousal. AROUSAL: There were 55 arousals in total, for an arousal index of 17 arousals/hour.  Of these, 37 were identified as respiratory-related arousals (12 /h), 0 were PLM-related arousals (0 /h), and 39 were non-specific arousals (12 /h). EEG: Review of the EEG showed no abnormal electrical discharges and symmetrical bihemispheric findings.   EKG: The EKG revealed normal sinus rhythm (NSR). Very rare PVCs were noted. The average heart rate during sleep was 48 bpm. AUDIO/VIDEO REVIEW: The audio and video review did not show any abnormal or unusual behaviors, movements, phonations or vocalizations. The patient took 2 restroom breaks.  Mild intermittent snoring was noted. POST-STUDY QUESTIONNAIRE: Post study, the patient indicated, that sleep was worse than usual. IMPRESSION:  1. Obstructive sleep apnea (OSA) 2. Dysfunctions associated with sleep stages or arousal from sleep 3. Poor sleep pattern RECOMMENDATIONS:  1. This study demonstrates evidence of severe obstructive sleep apnea with an AHI of 30.1/h, O2 nadir 79%.  The study was limited secondary to low sleep efficiency and poor sleep consolidation, decreased REM sleep, possibly underestimating his sleep disordered breathing.  The patient will be advised to proceed with home AutoPAP therapy. Alternatively, a full-night CPAP titration study would allow optimization of therapy if needed. Other treatment options may be limited, but may include the use of an oral appliance in selected patients or surgical treatment options.  Concomitant weight loss is recommended (where clinically appropriate). Please note, that untreated obstructive sleep apnea may carry additional perioperative morbidity. Patients with significant obstructive sleep apnea should receive perioperative PAP therapy  and the surgeons and particularly the anesthesiologist should be informed of the diagnosis and the severity of the sleep disordered breathing. 2. This study shows significant sleep fragmentation and abnormal sleep stage percentages; these are nonspecific findings and per se do not signify an intrinsic sleep disorder or a cause for the patient's sleep-related symptoms. Causes include (but are not limited to) the first night effect of the sleep study, circadian rhythm disturbances, medication effect or an underlying mood disorder or medical problem. 3. The patient should be cautioned not to drive, work at heights, or operate dangerous or heavy equipment when tired or sleepy. Review and reiteration of good sleep hygiene measures should be pursued with any patient. 4. The patient will be seen in follow-up by Dr. Rexene Alberts at [facility] for discussion of the test results and further management strategies. The referring provider will be notified of the test results.   I certify that I have reviewed the entire raw data recording prior to the issuance of this report in accordance with the Standards of Accreditation of the American Academy of Sleep Medicine (AASM). Star Age, MD, PhD Medical Director, [facility]) [place], [place] (Neurology and Sleep)              Technical Report:   General Information  Name: Sanad, [place] BMI: 29.92 Physician: Star Age, MD  ID: [id number] Height: 70.0 in Technician: Gaylyn Cheers,  RPSGT  Sex: Male Weight: 197.0 lb Record: [id number]  Age: 76 [12-03-1951] Date: 11/27/2022    Medical & Medication History    Mr. Cullen is a 71 year old male with an underlying medical history of hypertension, hyperlipidemia, type 2 diabetes, cervical radiculopathy, coronary calcification, paroxysmal A-fib and overweight state, who reports no significant sleep related symptoms other than occasional sleep disruption from nocturia, he is  noted to have mild, intermittent snoring per wife's report. He has been diagnosed recently with A-fib and blood pressure has been fluctuating but has improved since he was started on new medications. He has had palpitations in the past, recalls that at times of stress he has had palpitations, particularly when he went through his divorce. His Epworth sleepiness score is 4 out of 24, fatigue severity score is 13 out of 63. He wakes up fairly well rested, he has a bedtime of around 9 PM and rise time around 5 or 6 AM.  Eliquis, Lipitor, Tums, Coreg, Cinnamon, fish oil, Cozaar, Magnesium, Glucophage   Sleep Disorder      Comments   Patient arrived for a diagnostic polysomnogram. Procedure explained and all questions answered. Standard paste setup without complications. Patient slept supine, right, and left. Decreased sleep efficiency at 39.57%. Mild occasional snoring heard. Respiratory events observed, worse in REM sleep. Occasional PVC's observed. No significant PLMS observed. Two restroom visits.     Lights out: 08:41:25 PM Lights on: 04:49:13 AM   Time Total Supine Side Prone Upright  Recording (TRT) 8h 4.33m5h 20.5533mh 43.33m56m 0.58m 51m0.58m  12mep (TST) 3h 11.33m 1h49m.33m 1h 61m58m 0h 0533m 0h 0.733m  Late59m N1 N2 N3 REM Onset Per. Slp. Eff.  Actual 0h 0.58m 0h 16.08mh 0.58m 633m37.58m 56m15.33m 251m9.33m 3913m%   Stg31mr Wake N1 N2 N3 REM  Total 292.5 54.5 128.0 0.0 9.0  Supine 218.0 35.5 58.0 0.0 9.0  Side 74.5 19.0 70.0 0.0 0.0  Prone 0.0 0.0 0.0 0.0 0.0  Upright 0.0 0.0 0.0 0.0 0.0   Stg % Wake N1 N2 N3 REM  Total 60.4 28.5 66.8 0.0 4.7  Supine 45.0 18.5 30.3 0.0 4.7  Side 15.4 9.9 36.6 0.0 0.0  Prone 0.0 0.0 0.0 0.0 0.0  Upright 0.0 0.0 0.0 0.0 0.0     Apnea Summary Sub Supine Side Prone Upright  Total 74 Total 74 73 1 0 0    REM 11 11 0 0 0    NREM 63 62 1 0 0  Obs 73 REM 11 11 0 0 0    NREM 62 61 1 0 0  Mix 1 REM 0 0 0 0 0    NREM 1 1 0 0 0  Cen 0 REM 0 0 0 0 0    NREM 0 0 0 0  0   Rera Summary Sub Supine Side Prone Upright  Total 0 Total 0 0 0 0 0    REM 0 0 0 0 0    NREM 0 0 0 0 0   Hypopnea Summary Sub Supine Side Prone Upright  Total 29 Total '29 27 2 '$ 0 0    REM 0 0 0 0 0    NREM '29 27 2 '$ 0 0   4% Hypopnea Summary Sub Supine Side Prone Upright  Total (4%) 22 Total '22 20 2 '$ 0 0    REM 0 0 0 0 0    NREM '22 20 2 '$ 0 0  AHI Total Obs Mix Cen  32.27 Apnea 23.19 22.87 0.31 0.00   Hypopnea 9.09 -- -- --  30.08 Hypopnea (4%) 6.89 -- -- --    Total Supine Side Prone Upright  Position AHI 32.27 58.54 2.02 0.00 0.00  REM AHI 73.33   NREM AHI 30.25   Position RDI 32.27 58.54 2.02 0.00 0.00  REM RDI 73.33   NREM RDI 30.25    4% Hypopnea Total Supine Side Prone Upright  Position AHI (4%) 30.08 54.44 2.02 0.00 0.00  REM AHI (4%) 73.33   NREM AHI (4%) 27.95   Position RDI (4%) 30.08 54.44 2.02 0.00 0.00  REM RDI (4%) 73.33   NREM RDI (4%) 27.95    Desaturation Information Threshold: 2% <100% <90% <80% <70% <60% <50% <40%  Supine 239.0 28.0 1.0 0.0 0.0 0.0 0.0  Side 34.0 1.0 1.0 0.0 0.0 0.0 0.0  Prone 0.0 0.0 0.0 0.0 0.0 0.0 0.0  Upright 0.0 0.0 0.0 0.0 0.0 0.0 0.0  Total 273.0 29.0 2.0 0.0 0.0 0.0 0.0  Index 47.1 5.0 0.3 0.0 0.0 0.0 0.0   Threshold: 3% <100% <90% <80% <70% <60% <50% <40%  Supine 172.0 28.0 1.0 0.0 0.0 0.0 0.0  Side 10.0 1.0 1.0 0.0 0.0 0.0 0.0  Prone 0.0 0.0 0.0 0.0 0.0 0.0 0.0  Upright 0.0 0.0 0.0 0.0 0.0 0.0 0.0  Total 182.0 29.0 2.0 0.0 0.0 0.0 0.0  Index 31.4 5.0 0.3 0.0 0.0 0.0 0.0   Threshold: 4% <100% <90% <80% <70% <60% <50% <40%  Supine 123.0 27.0 1.0 0.0 0.0 0.0 0.0  Side 7.0 1.0 1.0 0.0 0.0 0.0 0.0  Prone 0.0 0.0 0.0 0.0 0.0 0.0 0.0  Upright 0.0 0.0 0.0 0.0 0.0 0.0 0.0  Total 130.0 28.0 2.0 0.0 0.0 0.0 0.0  Index 22.4 4.8 0.3 0.0 0.0 0.0 0.0   Threshold: 3% <100% <90% <80% <70% <60% <50% <40%  Supine 172 28 1 0 0 0 0  Side '10 1 1 '$ 0 0 0 0  Prone 0 0 0 0 0 0 0  Upright 0 0 0 0 0 0 0  Total 182 29 2 0 0 0 0    Awakening/Arousal Information # of Awakenings 35  Wake after sleep onset 157.63m Wake after persistent sleep 150.040m Arousal Assoc. Arousals Index  Apneas 24 7.5  Hypopneas 13 4.1  Leg Movements 2 0.6  Snore 0 0.0  PTT Arousals 0 0.0  Spontaneous 39 12.2  Total 78 24.4  Leg Movement Information PLMS LMs Index  Total LMs during PLMS 0 0.0  LMs w/ Microarousals 0 0.0   LM LMs Index  w/ Microarousal 2 0.6  w/ Awakening 0 0.0  w/ Resp Event 0 0.0  Spontaneous 5 1.6  Total 7 2.2     Desaturation threshold setting: 3% Minimum desaturation setting: 10 seconds SaO2 nadir: 78% The longest event was a 38 sec obstructive Hypopnea with a minimum SaO2 of 90%. The lowest SaO2 was 79% associated with a 28 sec obstructive Apnea. EKG Rates EKG Avg Max Min  Awake 50 83 45  Asleep 48 73 45  EKG Events: N/A

## 2022-12-05 ENCOUNTER — Telehealth: Payer: Self-pay | Admitting: *Deleted

## 2022-12-05 DIAGNOSIS — E663 Overweight: Secondary | ICD-10-CM | POA: Diagnosis not present

## 2022-12-05 DIAGNOSIS — L570 Actinic keratosis: Secondary | ICD-10-CM | POA: Diagnosis not present

## 2022-12-05 NOTE — Telephone Encounter (Signed)
-----   Message from Star Age, MD sent at 12/05/2022  8:50 AM EST ----- Patient referred by Dr. Einar Gip, seen by me on 11/12/2022, diagnostic PSG on 11/27/2022.    Please call and notify the patient that the recent sleep showed severe sleep apnea.  He did not sleep very well.  He did not achieve any dream sleep.  I would like for him to start treatment at home in the form of AutoPap therapy.  He may do better with treatment at home with an AutoPap machine rather than coming in for another sleep study for CPAP titration.  We will send in an order to a local DME company (of his choice, or as per insurance requirement). The DME representative will educate him on how to use the machine, how to put the mask on, etc. I have placed an order in the chart. Please send referral, talk to patient, send report to referring MD. We will need a FU in sleep clinic for 10 weeks post-PAP set up, please arrange that with me or one of our NPs. Thanks,   Star Age, MD, PhD [facility])

## 2022-12-05 NOTE — Addendum Note (Signed)
Addended by: Star Age on: 12/05/2022 08:50 AM   Modules accepted: Orders

## 2022-12-05 NOTE — Telephone Encounter (Signed)
I called pt. I advised pt that Dr. Rexene Alberts reviewed their sleep study results and found that pt has OSA SEVERE. Dr.  Rexene Alberts recommends that pt start autopap. I reviewed PAP compliance expectations with the pt. Pt is agreeable to starting an auto-PAP. I advised pt that an order will be sent to a DME, [facility], and they will call the pt within about one week after they file with the pt's insurance. They will show the pt how to use the machine, fit for masks, and troubleshoot the auto-PAP if needed. A follow up appt was made for insurance purposes with Dr. Rexene Alberts on 02-24-2023 at 1045. Pt verbalized understanding to arrive 15 minutes early and bring their auto-PAP. A letter with all of this information in it will be mailed to the pt as a reminder. I verified with the pt that the address we have on file is correct. Pt verbalized understanding of results. Pt had no questions at this time but was encouraged to call back if questions arise. I have sent the order to [facility] and have received confirmation that they have received the order.   Mailed copy of  sleep study to patient.

## 2022-12-09 NOTE — Telephone Encounter (Addendum)
Pt wife is calling. Stated she called [facility] and was told they don't have a order for pt. She is requesting a call back from nurse.

## 2022-12-09 NOTE — Telephone Encounter (Signed)
New, Willodean Rosenthal, RN; Alonna Minium; Minus Liberty; Nash Shearer Received, thank you!     Previous Messages    ----- Message ----- From: Brandon Melnick, RN Sent: 12/05/2022   1:20 PM EST To: Darlina Guys; Miquel Dunn; Nash Shearer; * Subject: new autopap user                              Good afternoon,  New order in EPIC for autopap user (severe OSA).  Lives in [place], [place] Male, 71 y.o., March 23, 1951 MRN: [id number] Phone: [place]

## 2022-12-09 NOTE — Telephone Encounter (Signed)
We have confirmation from [facility] w/ [facility] that order has been received. I called the local office and spoke with customer service. They cannot see the order on their end yet but said it takes time and to give it 3-5 days for order to process. I called the wife, Billy Fernandez (on [place]) and updated her. She was very appreciative for the call.

## 2022-12-18 DIAGNOSIS — G4733 Obstructive sleep apnea (adult) (pediatric): Secondary | ICD-10-CM | POA: Diagnosis not present

## 2022-12-23 ENCOUNTER — Encounter: Payer: Self-pay | Admitting: Cardiology

## 2022-12-23 DIAGNOSIS — I10 Essential (primary) hypertension: Secondary | ICD-10-CM | POA: Diagnosis not present

## 2022-12-23 DIAGNOSIS — U071 COVID-19: Secondary | ICD-10-CM | POA: Diagnosis not present

## 2022-12-24 ENCOUNTER — Telehealth: Payer: Self-pay | Admitting: Cardiology

## 2022-12-24 NOTE — Telephone Encounter (Signed)
From patient.

## 2022-12-24 NOTE — Telephone Encounter (Signed)
Patient's wife called in regards to a MyChart message she sent yesterday. Patient has covid, wants to confirm if paxlovid is okay to take if necessary. Please see MyChart message.

## 2022-12-25 ENCOUNTER — Telehealth: Payer: Self-pay | Admitting: Neurology

## 2022-12-25 NOTE — Telephone Encounter (Signed)
Wife has called to report after using the APAP 2-3 days pt contracted covid-19, wife states they have had no success in being able to speak with DME.  Wife is concerned that as a result of pt not being able to use the APAP he will look as if he is not being compliant, wife would like a call from [employer]

## 2022-12-25 NOTE — Telephone Encounter (Signed)
I called wife of pt.  She explained that pt has covid, and not able to use the PAP machine at this time. Has been in touch with [employer], [employer], and [place] to let us know that this is reason not using machine right now.  I relayed that set up 12-18-2022, he has 90 days for compliance.  Should be ok.  Appreciated call to let us know.  He is feeling better.

## 2022-12-30 DIAGNOSIS — Z0100 Encounter for examination of eyes and vision without abnormal findings: Secondary | ICD-10-CM | POA: Diagnosis not present

## 2022-12-31 ENCOUNTER — Other Ambulatory Visit: Payer: Self-pay | Admitting: Internal Medicine

## 2023-01-18 DIAGNOSIS — G4733 Obstructive sleep apnea (adult) (pediatric): Secondary | ICD-10-CM | POA: Diagnosis not present

## 2023-01-21 ENCOUNTER — Other Ambulatory Visit: Payer: Self-pay | Admitting: Cardiology

## 2023-01-21 DIAGNOSIS — I480 Paroxysmal atrial fibrillation: Secondary | ICD-10-CM

## 2023-02-11 ENCOUNTER — Telehealth: Payer: Self-pay | Admitting: Neurology

## 2023-02-11 NOTE — Telephone Encounter (Signed)
AutoPAP or CPAP does not cause bad breath. Acid reflux can cause bad breath. Recommend eval for reflux with PCP and agree with eval with dentist.

## 2023-02-11 NOTE — Telephone Encounter (Signed)
I called pts wife.  She said that in the last 2 wks pt has had halitosis.  She is asking if using APAP would cause that.  He is using FFM, does not necessarily sleep with mouth open. Has humidity and does not complain of dry mouth.  He has appt with dentist coming up as well.  I relayed that will see in March for APAP followup.   I will send message to Dr. Rexene Alberts and if anything more to be done will let her know.  She appreciated call back.

## 2023-02-11 NOTE — Telephone Encounter (Signed)
Billy Fernandez is calling stated she would like to talk to nurse about Auto Pap. Requesting a call back from nurse.

## 2023-02-16 ENCOUNTER — Other Ambulatory Visit: Payer: Self-pay | Admitting: Cardiology

## 2023-02-16 DIAGNOSIS — I10 Essential (primary) hypertension: Secondary | ICD-10-CM

## 2023-02-18 DIAGNOSIS — G4733 Obstructive sleep apnea (adult) (pediatric): Secondary | ICD-10-CM | POA: Diagnosis not present

## 2023-02-20 DIAGNOSIS — R69 Illness, unspecified: Secondary | ICD-10-CM | POA: Diagnosis not present

## 2023-02-24 ENCOUNTER — Other Ambulatory Visit: Payer: Self-pay | Admitting: *Deleted

## 2023-02-24 ENCOUNTER — Encounter: Payer: Self-pay | Admitting: Neurology

## 2023-02-24 ENCOUNTER — Ambulatory Visit: Payer: Medicare HMO | Admitting: Neurology

## 2023-02-24 VITALS — BP 137/83 | HR 56 | Ht 70.0 in | Wt 197.0 lb

## 2023-02-24 DIAGNOSIS — R69 Illness, unspecified: Secondary | ICD-10-CM | POA: Diagnosis not present

## 2023-02-24 DIAGNOSIS — G4733 Obstructive sleep apnea (adult) (pediatric): Secondary | ICD-10-CM

## 2023-02-24 NOTE — Patient Instructions (Signed)
It was nice to see you again today. I am glad to hear, things are going well with your autoPAP therapy. You have adjusted well to treatment with your new machine, and you are compliant with it. You have also fulfilled the insurance-mandated compliance percentage, which is reassuring, so you can get ongoing supplies through your insurance. Please talk to your DME provider about getting replacement supplies on a regular basis. Please be sure to change your filter every month, your mask about every 3 months, hose about every 6 months, humidifier chamber about yearly. Some restrictions are imposed by your insurance carrier with regard to how frequently you can get certain supplies.  Your DME company can provide further details if necessary.   Please continue using your autoPAP regularly. While your insurance requires that you use PAP at least 4 hours each night on 70% of the nights, I recommend, that you not skip any nights and use it throughout the night if you can. Getting used to PAP and staying with the treatment long term does take time and patience and discipline. Untreated obstructive sleep apnea when it is moderate to severe can have an adverse impact on cardiovascular health and raise her risk for heart disease, arrhythmias, hypertension, congestive heart failure, stroke and diabetes. Untreated obstructive sleep apnea causes sleep disruption, nonrestorative sleep, and sleep deprivation. This can have an impact on your day to day functioning and cause daytime sleepiness and impairment of cognitive function, memory loss, mood disturbance, and problems focussing. Using PAP regularly can improve these symptoms.  We can see you in 1 year, you can see one of our nurse practitioners as you are stable.

## 2023-02-24 NOTE — Progress Notes (Signed)
Subjective:    Patient ID: Billy Fernandez is a 72 y.o. male.  HPI    Interim history:   Mr. Brando is a 72 year old male with an underlying medical history of hypertension, hyperlipidemia, type 2 diabetes, cervical radiculopathy, coronary calcification, paroxysmal A-fib and overweight state, who presents for follow-up consultation of his Obstructive sleep apnea after interim testing and starting home AutoPap therapy.  The patient is accompanied by his wife today.  I first met him at the request of his cardiologist on 11/12/2022, at which time he reported intermittent snoring and a recent diagnosis of A-fib.  He was advised to proceed with a sleep study.  He had a baseline sleep study on 11/27/2022 which indicated severe sleep apnea with an AHI of 30.1/h, O2 nadir 79%.  The study was limited however secondary to poor sleep consolidation and low sleep efficiency as well as decreased REM sleep.  He was advised to proceed with home AutoPap therapy.  His set up date was 12/18/2022.  He has a ResMed air sense 10 AutoSet machine.  His DME company is [employer].  Today, 02/24/2023: I reviewed his AutoPap compliance data from 01/21/2023 through 02/19/2023, which is a total of 30 days, during which time he used his machine every night with percent use days greater than 4 hours at 97%, indicating excellent compliance with an average usage of 7 hours and 39 minutes, residual AHI at goal at 2.2/h, leak acceptable with the 95th percentile at 3.1 L/min, 95th percentile of pressure of 13.9 cm with a range of 7 to 14 cm.  He reports doing rather well, feeling better rested. Wife reports that he has no snoring and sleeps more quieter, machine is also very quiet, which was very surprising to her. He is very pleased and highly motivated to continue with treatment. Per his smart watch, he had rare episodes of A fib. He has a FU with cardiology scheduled for June. He had some halitosis, per wife, but this is better, had a  checkup with dentistry. He has some mouth dryness and intends to try a level higher than 4 with the humidity. He uses the F20 Airtouch FFM from [facility] and is happy with it.   The patient's allergies, current medications, family history, past medical history, past social history, past surgical history and problem list were reviewed and updated as appropriate.   Previously:   11/12/22: (He) reports no significant sleep related symptoms other than occasional sleep disruption from nocturia, he is noted to have mild, intermittent snoring per wife's report.  He has been diagnosed recently with A-fib and blood pressure has been fluctuating but has improved since he was started on new medications.  He has had palpitations in the past, recalls that at times of stress he has had palpitations, particularly when he went through his divorce.  He has a follow-up with you soon, has had cardiac work-up including Zio patch, echocardiogram, Lexiscan.  I reviewed your office note from 10/11/2022.  His Epworth sleepiness score is 4 out of 24, fatigue severity score is 13 out of 63.  He wakes up fairly well rested, he has a bedtime of around 9 PM and rise time around 5 or 6 AM.  He is retired, he used to work as a Chief Strategy Officer.  He lives with his wife, they have no pets in the household.  He has reduced his caffeine intake from 3 to 4 cups of coffee per day to 1 cup of coffee per day.  He drinks  tea occasionally with a meal but not daily.  He denies recurrent nocturnal or morning headaches.  He has nocturia 1 time or less than that on some nights.  He quit smoking in the late 90s.  He drinks alcohol occasionally, once or twice a month.  They do have a TV in the bedroom but watches very little, turn it off before falling asleep.  He is pursuing some lifestyle changes, he has lost about 10 pounds in the past month.  He has no family history of sleep apnea.   His Past Medical History Is Significant For: Past Medical History:   Diagnosis Date   Cervical radiculopathy at C6 04/14/2020   Essential hypertension, benign 05/28/2013   Hyperlipidemia 06/29/2013   Type 2 diabetes mellitus 07/01/2014    His Past Surgical History Is Significant For: History reviewed. No pertinent surgical history.  His Family History Is Significant For: Family History  Problem Relation Age of Onset   Breast cancer Mother    Cancer Mother    Diabetes Mother    Heart disease Father    Hyperlipidemia Father    Hypertension Father    Drug abuse Sister    Diabetes Brother    Heart disease Brother        cabg   Dementia Paternal Grandmother        Possible Alzheimer's disease   Alcohol abuse Neg Hx    COPD Neg Hx    Depression Neg Hx    Early death Neg Hx    Hearing loss Neg Hx    Kidney disease Neg Hx    Stroke Neg Hx     His Social History Is Significant For: Social History   Socioeconomic History   Marital status: Married    Spouse name: Not on file   Number of children: 2   Years of education: 12   Highest education level: High school graduate  Occupational History   Occupation: Retired  Tobacco Use   Smoking status: Former    Packs/day: 0.50    Years: 25.00    Total pack years: 12.50    Types: Cigarettes    Quit date: 05/07/1981    Years since quitting: 41.8   Smokeless tobacco: Never  Vaping Use   Vaping Use: Never used  Substance and Sexual Activity   Alcohol use: Yes    Alcohol/week: 1.0 standard drink of alcohol    Types: 1 Cans of beer per week    Comment: occasionally/rarely   Drug use: Not Currently   Sexual activity: Yes  Other Topics Concern   Not on file  Social History Narrative   Lives in 2 story home with wife   They enjoy travelling and exercising - very independent and active   Social Determinants of Health   Financial Resource Strain: Low Risk  (04/05/2022)   Overall Financial Resource Strain (CARDIA)    Difficulty of Paying Living Expenses: Not hard at all  Food Insecurity: No Food  Insecurity (04/05/2022)   Hunger Vital Sign    Worried About Running Out of Food in the Last Year: Never true    Ran Out of Food in the Last Year: Never true  Transportation Needs: No Transportation Needs (04/05/2022)   PRAPARE - Hydrologist (Medical): No    Lack of Transportation (Non-Medical): No  Physical Activity: Sufficiently Active (04/05/2022)   Exercise Vital Sign    Days of Exercise per Week: 7 days    Minutes of  Exercise per Session: 30 min  Stress: No Stress Concern Present (04/05/2022)   [place]    Feeling of Stress : Not at all  Social Connections: [place] (04/05/2022)   Social Connection and Isolation Panel [NHANES]    Frequency of Communication with Friends and Family: More than three times a week    Frequency of Social Gatherings with Friends and Family: More than three times a week    Attends Religious Services: More than 4 times per year    Active Member of [employer] or Organizations: Yes    Attends Music therapist: More than 4 times per year    Marital Status: Married    His Allergies Are:  No Known Allergies:   His Current Medications Are:  Outpatient Encounter Medications as of 02/24/2023  Medication Sig   atorvastatin (LIPITOR) 20 MG tablet TAKE 1 TABLET BY MOUTH EVERY DAY   Blood Glucose Calibration (ONETOUCH VERIO) SOLN Use as directed   Blood Glucose Monitoring Suppl (ONETOUCH VERIO REFLECT) w/Device KIT USE TO CHECK BLOOD GLUCOSE DAILY   calcium carbonate (TUMS) 500 MG chewable tablet Chew 2 tablets by mouth at bedtime.   carvedilol (COREG) 6.25 MG tablet TAKE 1 TABLET BY MOUTH TWICE A DAY   CINNAMON PO Take 1,000 mg by mouth daily.   ELIQUIS 5 MG TABS tablet TAKE 1 TABLET BY MOUTH TWICE A DAY   fish oil-omega-3 fatty acids 1000 MG capsule Take 2 g by mouth daily.   hydrochlorothiazide (MICROZIDE) 12.5 MG capsule TAKE 1 CAPSULE BY MOUTH  EVERY DAY IN THE MORNING   Lancets (FREESTYLE) lancets Use to check blood sugars. E11.9   losartan-hydrochlorothiazide (HYZAAR) 100-12.5 MG tablet Take 1 tablet by mouth daily.   Magnesium 250 MG TABS Take by mouth daily.   metFORMIN (GLUCOPHAGE) 1000 MG tablet TAKE 1 TABLET (1,000 MG TOTAL) BY MOUTH TWICE A DAY WITH FOOD   OneTouch Delica Lancets 99991111 MISC Use to check blood sugars once a day   ONETOUCH VERIO test strip 1 EACH BY OTHER ROUTE DAILY.   No facility-administered encounter medications on file as of 02/24/2023.  :  Review of Systems:  Out of a complete 14 point review of systems, all are reviewed and negative with the exception of these symptoms as listed below:  Review of Systems  Neurological:        Doing well on cpap.  ESS 3      Objective:  Neurological Exam  Physical Exam Physical Examination:   Vitals:   02/24/23 1052  BP: 137/83  Pulse: (!) 56    General Examination: The patient is a very pleasant 72 y.o. male in no acute distress. He appears well-developed and well-nourished and well groomed.   HEENT: Normocephalic, atraumatic, pupils are equal, round and reactive to light, corrective eyeglasses in place.  Extraocular tracking is good without limitation to gaze excursion or nystagmus noted. Hearing is grossly intact. Face is symmetric with normal facial animation. Speech is clear with no dysarthria noted. There is no hypophonia. There is no lip, neck/head, jaw or voice tremor. Neck is supple with full range of passive and active motion. There are no carotid bruits on auscultation. Oropharynx exam reveals: No significant mouth dryness, good dental hygiene, moderate airway crowding.  Tongue protrudes centrally and palate elevates symmetrically.    Chest: Clear to auscultation without wheezing, rhonchi or crackles noted.   Heart: S1+S2+0, regular and normal without murmurs, rubs or gallops  noted.  Mildly bradycardic.   Abdomen: Soft, non-tender and  non-distended.   Extremities: There is no pitting edema in the distal lower extremities bilaterally.    Skin: Warm and dry without trophic changes noted.    Musculoskeletal: exam reveals no obvious joint deformities.    Neurologically:  Mental status: The patient is awake, alert and oriented in all 4 spheres. His immediate and remote memory, attention, language skills and fund of knowledge are appropriate. There is no evidence of aphasia, agnosia, apraxia or anomia. Speech is clear with normal prosody and enunciation. Thought process is linear. Mood is normal and affect is normal.  Cranial nerves II - XII are as described above under HEENT exam.  Motor exam: Normal bulk, strength and tone is noted. There is no obvious action or resting tremor.  Fine motor skills and coordination: grossly intact.  Cerebellar testing: No dysmetria or intention tremor. There is no truncal or gait ataxia.  Sensory exam: intact to light touch in the upper and lower extremities.  Gait, station and balance: He stands easily. No veering to one side is noted. No leaning to one side is noted. Posture is age-appropriate and stance is narrow based. Gait shows normal stride length and normal pace. No problems turning are noted.    Assessment and Plan:  In summary, Taji Hrncir is a very pleasant 72 year old male with an underlying medical history of hypertension, hyperlipidemia, type 2 diabetes, cervical radiculopathy, coronary calcification, paroxysmal A-fib and overweight state, who presents for follow-up consultation of his obstructive sleep apnea (OSA) after interim testing and starting home AutoPap therapy.  He had a baseline sleep study on 11/27/2022 which indicated severe sleep apnea with an AHI of 30.1/h, O2 nadir 79%.  The study was limited however secondary to poor sleep consolidation and low sleep efficiency as well as decreased REM sleep.  He was advised to proceed with home AutoPap therapy.  His set up date was  12/18/2022.  He has a ResMed air sense 10 AutoSet machine.  His DME company is [employer]. He is fully compliant with treatment with excellent apnea control and good results.  He uses a AirTouch fullface mask from [employer]	[place].  His leak is on the low side.  He is commended for his treatment adherence and advised to continue with full compliance with his AutoPap machine.  We talked about his sleep study results and reviewed his compliance data in detail today.  He is advised that halitosis can be seen in the context of dry mouth and also reflux symptoms.  He has not had any severe reflux in about a year but has had regurgitation and heartburn before.  Triggers can be red sauce for example.  He is very pleased with his AutoPap machine.  His wife is also very pleased with the outcome.  At this juncture, he can follow-up in sleep clinic to see one of our nurse practitioners routinely in 1 year.  I answered all their questions today and the patient and his wife are in agreement.   I spent 30 minutes in total face-to-face time and in reviewing records during pre-charting, more than 50% of which was spent in counseling and coordination of care, reviewing test results, reviewing medications and treatment regimen and/or in discussing or reviewing the diagnosis of OSA, the prognosis and treatment options. Pertinent laboratory and imaging test results that were available during this visit with the patient were reviewed by me and considered in my medical decision making (see chart for details).

## 2023-03-03 DIAGNOSIS — G4733 Obstructive sleep apnea (adult) (pediatric): Secondary | ICD-10-CM | POA: Diagnosis not present

## 2023-03-16 ENCOUNTER — Other Ambulatory Visit: Payer: Self-pay | Admitting: Internal Medicine

## 2023-03-19 ENCOUNTER — Ambulatory Visit: Payer: Medicare HMO | Admitting: Podiatry

## 2023-03-19 ENCOUNTER — Encounter: Payer: Self-pay | Admitting: Podiatry

## 2023-03-19 DIAGNOSIS — B351 Tinea unguium: Secondary | ICD-10-CM

## 2023-03-19 DIAGNOSIS — G4733 Obstructive sleep apnea (adult) (pediatric): Secondary | ICD-10-CM | POA: Diagnosis not present

## 2023-03-19 NOTE — Progress Notes (Signed)
Subjective:   Patient ID: Billy Fernandez, male   DOB: 72 y.o.   MRN: [id number]   HPI Patient presents discoloration of the big toenails of both feet concerned about the look of them and whether or not there may be pathology.  Patient does not smoke tries to be active   Review of Systems  All other systems reviewed and are negative.       Objective:  Physical Exam Vitals and nursing note reviewed.  Constitutional:      Appearance: He is well-developed.  Pulmonary:     Effort: Pulmonary effort is normal.  Musculoskeletal:        General: Normal range of motion.  Skin:    General: Skin is warm.  Neurological:     Mental Status: He is alert.     Neurovascular status found to be intact muscle strength was found to be adequate range of motion adequate with patient having a slight area of yellow discoloration hallux left over right within the middle of the nailbed.  No other pathology was noted and patient has good digital perfusion      Assessment:  Possibility for fungus versus trauma of nailbeds secondary to overall condition     Plan:  Reviewed condition recommended that he continue with conservative treatment let them grow out and that I do not recommend oral medicines currently.  Patient will be seen back to recheck all questions answered may require oral medicine or topical possibly

## 2023-04-03 DIAGNOSIS — G4733 Obstructive sleep apnea (adult) (pediatric): Secondary | ICD-10-CM | POA: Diagnosis not present

## 2023-04-07 DIAGNOSIS — G4733 Obstructive sleep apnea (adult) (pediatric): Secondary | ICD-10-CM | POA: Diagnosis not present

## 2023-04-10 ENCOUNTER — Other Ambulatory Visit: Payer: Self-pay | Admitting: Cardiology

## 2023-04-10 DIAGNOSIS — I10 Essential (primary) hypertension: Secondary | ICD-10-CM

## 2023-04-14 ENCOUNTER — Telehealth: Payer: Self-pay | Admitting: Internal Medicine

## 2023-04-14 NOTE — Telephone Encounter (Signed)
Contacted Billy Fernandez to schedule their annual wellness visit. Patient declined to schedule AWV at this time.  [facility] Care Guide [facility] AWV TEAM Direct Dial: [phone]

## 2023-04-19 DIAGNOSIS — G4733 Obstructive sleep apnea (adult) (pediatric): Secondary | ICD-10-CM | POA: Diagnosis not present

## 2023-04-20 ENCOUNTER — Encounter: Payer: Self-pay | Admitting: Internal Medicine

## 2023-04-20 NOTE — Patient Instructions (Addendum)
      Blood work was ordered.   The lab is on the first floor.    Medications changes include :   None     Return in about 6 months (around 10/21/2023) for Physical Exam.

## 2023-04-20 NOTE — Progress Notes (Unsigned)
Subjective:    Patient ID: Billy Fernandez, male    DOB: 04/27/1951, 72 y.o.   MRN: [id number]     HPI Xavien is here for follow up of his chronic medical problems.  A few more palpitations -  now almost daily -- can last minutes or longer.   HR 140's.    He is walking a lot.   Eating well.    Medications and allergies reviewed with patient and updated if appropriate.  Current Outpatient Medications on File Prior to Visit  Medication Sig Dispense Refill   atorvastatin (LIPITOR) 20 MG tablet TAKE 1 TABLET BY MOUTH EVERY DAY 90 tablet 2   Blood Glucose Calibration (ONETOUCH VERIO) SOLN Use as directed 3 each 3   Blood Glucose Monitoring Suppl (ONETOUCH VERIO REFLECT) w/Device KIT USE TO CHECK BLOOD GLUCOSE DAILY 1 kit 0   calcium carbonate (TUMS) 500 MG chewable tablet Chew 2 tablets by mouth at bedtime.     carvedilol (COREG) 6.25 MG tablet TAKE 1 TABLET BY MOUTH TWICE A DAY 180 tablet 2   CINNAMON PO Take 1,000 mg by mouth daily.     ELIQUIS 5 MG TABS tablet TAKE 1 TABLET BY MOUTH TWICE A DAY 60 tablet 3   fish oil-omega-3 fatty acids 1000 MG capsule Take 2 g by mouth daily.     hydrochlorothiazide (MICROZIDE) 12.5 MG capsule TAKE 1 CAPSULE BY MOUTH EVERY DAY IN THE MORNING 90 capsule 0   Lancets (FREESTYLE) lancets Use to check blood sugars. E11.9 100 each 1   losartan-hydrochlorothiazide (HYZAAR) 100-12.5 MG tablet Take 1 tablet by mouth daily. 90 tablet 3   Magnesium 250 MG TABS Take by mouth daily.     metFORMIN (GLUCOPHAGE) 1000 MG tablet TAKE 1 TABLET (1,000 MG TOTAL) BY MOUTH TWICE A DAY WITH FOOD 180 tablet 2   OneTouch Delica Lancets 30G MISC Use to check blood sugars once a day 100 each 3   ONETOUCH VERIO test strip 1 EACH BY OTHER ROUTE DAILY. 100 strip 2   No current facility-administered medications on file prior to visit.     Review of Systems  Constitutional:  Negative for fever.  Respiratory:  Positive for cough. Negative for shortness of breath and  wheezing.   Cardiovascular:  Positive for palpitations. Negative for chest pain and leg swelling.  Neurological:  Negative for dizziness, light-headedness and headaches.       Objective:   Vitals:   04/21/23 0921  BP: 126/80  Pulse: (!) 50  Temp: 98.6 F (37 C)  SpO2: 97%   BP Readings from Last 3 Encounters:  04/21/23 126/80  02/24/23 137/83  11/25/22 132/86   Wt Readings from Last 3 Encounters:  04/21/23 194 lb (88 kg)  02/24/23 197 lb (89.4 kg)  11/25/22 196 lb 9.6 oz (89.2 kg)   Body mass index is 27.84 kg/m.    Physical Exam Constitutional:      General: He is not in acute distress.    Appearance: Normal appearance. He is not ill-appearing.  HENT:     Head: Normocephalic and atraumatic.  Eyes:     Conjunctiva/sclera: Conjunctivae normal.  Cardiovascular:     Rate and Rhythm: Normal rate and regular rhythm.     Heart sounds: Normal heart sounds.  Pulmonary:     Effort: Pulmonary effort is normal. No respiratory distress.     Breath sounds: Normal breath sounds. No wheezing or rales.  Musculoskeletal:     Right lower  leg: No edema.     Left lower leg: No edema.  Skin:    General: Skin is warm and dry.     Findings: No rash.  Neurological:     Mental Status: He is alert. Mental status is at baseline.  Psychiatric:        Mood and Affect: Mood normal.        Lab Results  Component Value Date   WBC 6.3 10/14/2022   HGB 15.9 10/14/2022   HCT 46.5 10/14/2022   PLT 227.0 10/14/2022   GLUCOSE 117 (H) 10/14/2022   CHOL 120 10/14/2022   TRIG 125.0 10/14/2022   HDL 45.70 10/14/2022   LDLCALC 50 10/14/2022   ALT 21 10/14/2022   AST 21 10/14/2022   NA 137 10/14/2022   K 3.9 10/14/2022   CL 101 10/14/2022   CREATININE 1.08 10/14/2022   BUN 22 10/14/2022   CO2 29 10/14/2022   TSH 0.99 10/14/2022   PSA 1.25 04/17/2022   HGBA1C 7.4 (H) 10/14/2022   MICROALBUR <0.7 04/17/2022     Assessment & Plan:    See Problem List for Assessment and Plan of  chronic medical problems.

## 2023-04-21 ENCOUNTER — Ambulatory Visit (INDEPENDENT_AMBULATORY_CARE_PROVIDER_SITE_OTHER): Payer: Medicare HMO | Admitting: Internal Medicine

## 2023-04-21 VITALS — BP 126/80 | HR 50 | Temp 98.6°F | Ht 70.0 in | Wt 194.0 lb

## 2023-04-21 DIAGNOSIS — I480 Paroxysmal atrial fibrillation: Secondary | ICD-10-CM | POA: Diagnosis not present

## 2023-04-21 DIAGNOSIS — R931 Abnormal findings on diagnostic imaging of heart and coronary circulation: Secondary | ICD-10-CM | POA: Diagnosis not present

## 2023-04-21 DIAGNOSIS — E782 Mixed hyperlipidemia: Secondary | ICD-10-CM

## 2023-04-21 DIAGNOSIS — Z125 Encounter for screening for malignant neoplasm of prostate: Secondary | ICD-10-CM | POA: Diagnosis not present

## 2023-04-21 DIAGNOSIS — E119 Type 2 diabetes mellitus without complications: Secondary | ICD-10-CM | POA: Diagnosis not present

## 2023-04-21 DIAGNOSIS — I10 Essential (primary) hypertension: Secondary | ICD-10-CM | POA: Diagnosis not present

## 2023-04-21 LAB — CBC WITH DIFFERENTIAL/PLATELET
Basophils Absolute: 0 10*3/uL (ref 0.0–0.1)
Basophils Relative: 0.6 % (ref 0.0–3.0)
Eosinophils Absolute: 0.2 10*3/uL (ref 0.0–0.7)
Eosinophils Relative: 3.8 % (ref 0.0–5.0)
HCT: 46.4 % (ref 39.0–52.0)
Hemoglobin: 15.8 g/dL (ref 13.0–17.0)
Lymphocytes Relative: 28.8 % (ref 12.0–46.0)
Lymphs Abs: 1.8 10*3/uL (ref 0.7–4.0)
MCHC: 34 g/dL (ref 30.0–36.0)
MCV: 89 fl (ref 78.0–100.0)
Monocytes Absolute: 0.6 10*3/uL (ref 0.1–1.0)
Monocytes Relative: 9.4 % (ref 3.0–12.0)
Neutro Abs: 3.5 10*3/uL (ref 1.4–7.7)
Neutrophils Relative %: 57.4 % (ref 43.0–77.0)
Platelets: 242 10*3/uL (ref 150.0–400.0)
RBC: 5.21 Mil/uL (ref 4.22–5.81)
RDW: 14.4 % (ref 11.5–15.5)
WBC: 6.1 10*3/uL (ref 4.0–10.5)

## 2023-04-21 LAB — COMPREHENSIVE METABOLIC PANEL
ALT: 21 U/L (ref 0–53)
AST: 18 U/L (ref 0–37)
Albumin: 4.2 g/dL (ref 3.5–5.2)
Alkaline Phosphatase: 34 U/L — ABNORMAL LOW (ref 39–117)
BUN: 18 mg/dL (ref 6–23)
CO2: 29 mEq/L (ref 19–32)
Calcium: 10.2 mg/dL (ref 8.4–10.5)
Chloride: 103 mEq/L (ref 96–112)
Creatinine, Ser: 1.04 mg/dL (ref 0.40–1.50)
GFR: 72.38 mL/min (ref >60.00)
Glucose, Bld: 104 mg/dL — ABNORMAL HIGH (ref 70–99)
Potassium: 5 mEq/L (ref 3.5–5.1)
Sodium: 141 mEq/L (ref 135–145)
Total Bilirubin: 1 mg/dL (ref 0.2–1.2)
Total Protein: 6.8 g/dL (ref 6.0–8.3)

## 2023-04-21 LAB — LIPID PANEL
Cholesterol: 125 mg/dL (ref 0–200)
HDL: 48.6 mg/dL (ref >39.00)
LDL Cholesterol: 57 mg/dL (ref 0–99)
NonHDL: 76.09
Total CHOL/HDL Ratio: 3
Triglycerides: 97 mg/dL (ref 0.0–149.0)
VLDL: 19.4 mg/dL (ref 0.0–40.0)

## 2023-04-21 LAB — HEMOGLOBIN A1C: Hgb A1c MFr Bld: 6.7 % — ABNORMAL HIGH (ref 4.6–6.5)

## 2023-04-21 LAB — PSA, MEDICARE: PSA: 1.48 ng/ml (ref 0.10–4.00)

## 2023-04-21 LAB — MICROALBUMIN / CREATININE URINE RATIO
Creatinine,U: 57.4 mg/dL
Microalb Creat Ratio: 1.2 mg/g (ref 0.0–30.0)
Microalb, Ur: <0.7 mg/dL (ref 0.0–1.9)

## 2023-04-21 LAB — TSH: TSH: 1.12 u[IU]/mL (ref 0.35–5.50)

## 2023-04-21 NOTE — Assessment & Plan Note (Signed)
Chronic CT coronary calcium score of 129 Blood pressure well controlled Continue atorvastatin 20 mg daily, Coreg 6.25 mg twice daily On Eliquis 5 mg twice daily for A-fib

## 2023-04-21 NOTE — Assessment & Plan Note (Addendum)
Chronic Following with cardiology Having intermittent afib On Eliquis 5 mg twice daily, Coreg 6.25 mg twice daily CBC, CMP

## 2023-04-21 NOTE — Assessment & Plan Note (Addendum)
Chronic Lab Results  Component Value Date   HGBA1C 7.4 (H) 10/14/2022   Sugars not ideally controlled Continue metformin 1000 mg twice daily Stressed diabetic diet, regular exercise He has significantly improved his lifestyle Would like to ideally start him on an SGLT2 inhibitor, but co-pay is too high

## 2023-04-21 NOTE — Assessment & Plan Note (Signed)
Chronic Regular exercise and healthy diet encouraged Check lipid panel  Continue atorvastatin 20 mg daily  Lab Results  Component Value Date   LDLCALC 50 10/14/2022

## 2023-04-21 NOTE — Assessment & Plan Note (Signed)
Chronic Blood pressure well controlled CMP  Continue Coreg 6.25 mg twice daily, losartan-HCT 100-25 mg daily

## 2023-04-30 DIAGNOSIS — Z833 Family history of diabetes mellitus: Secondary | ICD-10-CM | POA: Diagnosis not present

## 2023-04-30 DIAGNOSIS — I10 Essential (primary) hypertension: Secondary | ICD-10-CM | POA: Diagnosis not present

## 2023-04-30 DIAGNOSIS — Z7984 Long term (current) use of oral hypoglycemic drugs: Secondary | ICD-10-CM | POA: Diagnosis not present

## 2023-04-30 DIAGNOSIS — E119 Type 2 diabetes mellitus without complications: Secondary | ICD-10-CM | POA: Diagnosis not present

## 2023-04-30 DIAGNOSIS — Z7901 Long term (current) use of anticoagulants: Secondary | ICD-10-CM | POA: Diagnosis not present

## 2023-04-30 DIAGNOSIS — I4891 Unspecified atrial fibrillation: Secondary | ICD-10-CM | POA: Diagnosis not present

## 2023-04-30 DIAGNOSIS — Z809 Family history of malignant neoplasm, unspecified: Secondary | ICD-10-CM | POA: Diagnosis not present

## 2023-04-30 DIAGNOSIS — M1990 Unspecified osteoarthritis, unspecified site: Secondary | ICD-10-CM | POA: Diagnosis not present

## 2023-04-30 DIAGNOSIS — D6869 Other thrombophilia: Secondary | ICD-10-CM | POA: Diagnosis not present

## 2023-04-30 DIAGNOSIS — E785 Hyperlipidemia, unspecified: Secondary | ICD-10-CM | POA: Diagnosis not present

## 2023-05-03 DIAGNOSIS — G4733 Obstructive sleep apnea (adult) (pediatric): Secondary | ICD-10-CM | POA: Diagnosis not present

## 2023-05-06 ENCOUNTER — Encounter: Payer: Self-pay | Admitting: Cardiology

## 2023-05-06 ENCOUNTER — Other Ambulatory Visit: Payer: Self-pay | Admitting: Cardiology

## 2023-05-06 DIAGNOSIS — I480 Paroxysmal atrial fibrillation: Secondary | ICD-10-CM

## 2023-05-07 NOTE — Telephone Encounter (Signed)
From patient.

## 2023-05-08 ENCOUNTER — Encounter: Payer: Self-pay | Admitting: Cardiology

## 2023-05-12 NOTE — Telephone Encounter (Signed)
From patient.

## 2023-05-19 DIAGNOSIS — G4733 Obstructive sleep apnea (adult) (pediatric): Secondary | ICD-10-CM | POA: Diagnosis not present

## 2023-05-27 ENCOUNTER — Encounter: Payer: Self-pay | Admitting: Cardiology

## 2023-05-27 ENCOUNTER — Ambulatory Visit: Payer: Medicare HMO | Admitting: Cardiology

## 2023-05-27 VITALS — BP 118/76 | HR 56 | Resp 16 | Ht 70.0 in | Wt 196.0 lb

## 2023-05-27 DIAGNOSIS — E7800 Pure hypercholesterolemia, unspecified: Secondary | ICD-10-CM

## 2023-05-27 DIAGNOSIS — I10 Essential (primary) hypertension: Secondary | ICD-10-CM | POA: Diagnosis not present

## 2023-05-27 DIAGNOSIS — G4733 Obstructive sleep apnea (adult) (pediatric): Secondary | ICD-10-CM

## 2023-05-27 DIAGNOSIS — R931 Abnormal findings on diagnostic imaging of heart and coronary circulation: Secondary | ICD-10-CM | POA: Diagnosis not present

## 2023-05-27 DIAGNOSIS — I480 Paroxysmal atrial fibrillation: Secondary | ICD-10-CM | POA: Diagnosis not present

## 2023-05-27 NOTE — Progress Notes (Signed)
Primary Physician/Referring:  Pincus Sanes, MD  Patient ID: Billy Fernandez, male    DOB: 23-May-1951, 72 y.o.   MRN: [id number]  Chief Complaint  Patient presents with   Paroxysmal atrial fibrillation   Hypertension   Hyperlipidemia   Follow-up    6 months    HPI:    Billy Fernandez  is a 72 y.o. Caucasian male patient with hypertension, hyperlipidemia and type 2 diabetes mellitus and chronic palpitations, seen by me in September 2023 when he presented with fatigue and palpitations and his smart watch revealing atrial fibrillation.  He has now been diagnosed with severe sleep apnea since December 2023 and is presently on AutoPap.  He has not had any further episodes of palpitation.  He is presently asymptomatic.   Past Medical History:  Diagnosis Date   Cervical radiculopathy at C6 04/14/2020   Essential hypertension, benign 05/28/2013   Hyperlipidemia 06/29/2013   Type 2 diabetes mellitus 07/01/2014     Social History   Tobacco Use   Smoking status: Former    Packs/day: 0.50    Years: 25.00    Additional pack years: 0.00    Total pack years: 12.50    Types: Cigarettes    Quit date: 05/07/1981    Years since quitting: 42.0   Smokeless tobacco: Never  Substance Use Topics   Alcohol use: Yes    Alcohol/week: 1.0 standard drink of alcohol    Types: 1 Cans of beer per week    Comment: occasionally/rarely   Marital Status: Married  ROS  Review of Systems  Cardiovascular:  Negative for chest pain, dyspnea on exertion, leg swelling and palpitations.  Gastrointestinal:  Negative for melena.   Objective  Blood pressure 118/76, pulse (!) 56, resp. rate 16, height 5\' 10"  (1.778 m), weight 196 lb (88.9 kg), SpO2 97 %. Body mass index is 28.12 kg/m.     05/27/2023   10:41 AM 04/21/2023    9:21 AM 02/24/2023   10:52 AM  Vitals with BMI  Height 5\' 10"  5\' 10"  5\' 10"   Weight 196 lbs 194 lbs 197 lbs  BMI 28.12 27.84 28.27  Systolic 118 126 409  Diastolic 76 80 83  Pulse 56 50  56     Physical Exam  Neck: No JVD present.  Cardiovascular: Regular rhythm, normal heart sounds, intact distal pulses and normal pulses. Exam reveals no gallop.  No murmur heard. Pulmonary/Chest: Effort normal and breath sounds normal.  Abdominal: Soft. Bowel sounds are normal.  Musculoskeletal:        General: No edema.     Laboratory examination:   Lab Results  Component Value Date   NA 141 04/21/2023   K 5.0 04/21/2023   CO2 29 04/21/2023   GLUCOSE 104 (H) 04/21/2023   BUN 18 04/21/2023   CREATININE 1.04 04/21/2023   CALCIUM 10.2 04/21/2023   GFRNONAA 67 12/25/2018   Lab Results  Component Value Date   ALT 21 04/21/2023   AST 18 04/21/2023   ALKPHOS 34 (L) 04/21/2023   BILITOT 1.0 04/21/2023      Latest Ref Rng & Units 04/21/2023   10:21 AM 10/14/2022    9:19 AM 04/17/2022    8:53 AM  CBC  WBC 4.0 - 10.5 K/uL 6.1  6.3  5.8   Hemoglobin 13.0 - 17.0 g/dL 81.1  91.4  78.2   Hematocrit 39.0 - 52.0 % 46.4  46.5  44.5   Platelets 150.0 - 400.0 K/uL 242.0  227.0  218.0  Lab Results  Component Value Date   CHOL 125 04/21/2023   HDL 48.60 04/21/2023   LDLCALC 57 04/21/2023   TRIG 97.0 04/21/2023   CHOLHDL 3 04/21/2023    HEMOGLOBIN A1C Lab Results  Component Value Date   HGBA1C 6.7 (H) 04/21/2023   TSH Recent Labs    10/14/22 0919 04/21/23 1021  TSH 0.99 1.12    Radiology:   Coronary calcium score 04/19/2021: Coronary calcium score of 129. This was 45 percentile for age-, race-, and sex-matched controls. LM: 0 LAD: 129 LCx: 0 RCA: 0. Visualized cardiac and noncardiac structures within normal limits.  Cardiac Studies:   Zio Patch Extended out patient EKG monitoring 13 days starting 10/14/2022: Predominant Rhythm :        Normal sinus rhythm min HR: 43. Max HR 12/23/2026 Atrial arrhythmias:               Brief atrial tachycardia, sustained atrial fibrillation for 3 hours and 29 minutes. Atrial fibrillation:                  2% atrial fibrillation  burden, symptomatic with palpitations. Ventricular arrhythmias:      Occasional PVCs. PVC Burden <1 Heart Block:                        None Symptoms:                          Correlated with atrial fibrillation, brief atrial tachycardia and PVCs and PACs   PCV ECHOCARDIOGRAM COMPLETE 11/01/2022  Narrative Echocardiogram 11/01/2022: Left ventricle cavity is normal in size. Moderate concentric hypertrophy of the left ventricle. Normal global wall motion. Normal LV systolic function with EF 65%. Normal diastolic filling pattern. Mild tricuspid regurgitation. No evidence of pulmonary hypertension.    PCV MYOCARDIAL PERFUSION WO LEXISCAN 11/04/2022  Narrative Exercise nuclear stress test 11/04/2022: Myocardial perfusion is normal. Overall LV systolic function is normal without regional wall motion abnormalities. Stress LV EF: 63%. Abnormal ECG stress. The patient exercised for 6 minutes and 3 seconds of a Bruce protocol, achieving approximately 7.1 METs & 86% MPHR. Reduced exercise tolerance. Resting EKG demonstrated normal sinus rhythm. No ST-T wave abnormalities. Peak EKG revealed 2 mm horizontal ST depression and occasional premature ventricular contractions with ST changes persisting for >2 min into recovery. The blood pressure response was normal. The baseline blood pressure was 150/100 mmHg and increased to 160/98 mmHg. No previous exam available for comparison. Low risk.   Sleep study 11/27/2022:  Severe obstructive sleep apnea,  treated with AutoPap machine.   EKG:   EKG 11/25/2022: Sinus bradycardia at the rate of 55 bpm, normal axis, poor R wave progression, low-voltage complexes.  Nonspecific T abnormality.  Single PAC.  Compared to 10/11/2022, no change.   Allergies  No Known Allergies  Current Outpatient Medications:    atorvastatin (LIPITOR) 20 MG tablet, TAKE 1 TABLET BY MOUTH EVERY DAY, Disp: 90 tablet, Rfl: 2   Blood Glucose Calibration (ONETOUCH VERIO) SOLN, Use as  directed, Disp: 3 each, Rfl: 3   Blood Glucose Monitoring Suppl (ONETOUCH VERIO REFLECT) w/Device KIT, USE TO CHECK BLOOD GLUCOSE DAILY, Disp: 1 kit, Rfl: 0   calcium carbonate (TUMS) 500 MG chewable tablet, Chew 2 tablets by mouth at bedtime., Disp: , Rfl:    carvedilol (COREG) 6.25 MG tablet, TAKE 1 TABLET BY MOUTH TWICE A DAY, Disp: 180 tablet, Rfl: 2   CINNAMON  PO, Take 1,000 mg by mouth daily., Disp: , Rfl:    ELIQUIS 5 MG TABS tablet, TAKE 1 TABLET BY MOUTH TWICE A DAY, Disp: 60 tablet, Rfl: 3   fish oil-omega-3 fatty acids 1000 MG capsule, Take 2 g by mouth daily., Disp: , Rfl:    Lancets (FREESTYLE) lancets, Use to check blood sugars. E11.9, Disp: 100 each, Rfl: 1   losartan-hydrochlorothiazide (HYZAAR) 100-12.5 MG tablet, Take 1 tablet by mouth daily., Disp: 90 tablet, Rfl: 3   Magnesium 250 MG TABS, Take by mouth daily., Disp: , Rfl:    metFORMIN (GLUCOPHAGE) 1000 MG tablet, TAKE 1 TABLET (1,000 MG TOTAL) BY MOUTH TWICE A DAY WITH FOOD, Disp: 180 tablet, Rfl: 2   OneTouch Delica Lancets 30G MISC, Use to check blood sugars once a day, Disp: 100 each, Rfl: 3   ONETOUCH VERIO test strip, 1 EACH BY OTHER ROUTE DAILY., Disp: 100 strip, Rfl: 2   Assessment     ICD-10-CM   1. Paroxysmal atrial fibrillation (HCC)  I48.0 EKG 12-Lead    2. Pure hypercholesterolemia  E78.00     3. Essential hypertension, benign  I10     4. Elevated coronary artery calcium score 04/19/21: Coronary calcium score of 129 in 49 percentile  R93.1     5. Severe OSA on Auto PAP Dec 2023  G47.33       CHA2DS2-VASc Score is 4.  Yearly risk of stroke: 5% (A, HTN, DM, Vasc Dz).  Score of 1=0.6; 2=2.2; 3=3.2; 4=4.8; 5=7.2; 6=9.8; 7=>9.8) -(CHF; HTN; vasc disease DM,  Male = 1; Age <65 =0; 65-74 = 1,  >75 =2; stroke/embolism= 2).    Medications Discontinued During This Encounter  Medication Reason   hydrochlorothiazide (MICROZIDE) 12.5 MG capsule       No orders of the defined types were placed in this  encounter.  Orders Placed This Encounter  Procedures   EKG 12-Lead   Recommendations:   Billy Fernandez is a 72 y.o.  Caucasian male patient with hypertension, hyperlipidemia and type 2 diabetes mellitus and chronic palpitations, seen by me in September 2023 when he presented with fatigue and palpitations and his smart watch revealing atrial fibrillation.  1. Paroxysmal atrial fibrillation (HCC) Patient is maintaining sinus rhythm, in view of high CHA2DS2-VASc rescore, he is also on Eliquis, continue the same.  He is tolerating the medication well without bleeding diathesis.  His wife is present at the bedside. - EKG 12-Lead  2. Pure hypercholesterolemia Patient has moderately elevated coronary calcium score in the 50th percentile, presently on a statin and lipids are well-controlled.  3. Essential hypertension, benign Blood pressure under excellent control, no changes in the medications were done today.  He is on appropriately losartan in view of diabetes and hypertension.  4. Elevated coronary artery calcium score 04/19/21: Coronary calcium score of 129 in 49 percentile Stable, on statin, continue same.  No indication for aspirin as he is on Eliquis.  5. Severe OSA on Auto PAP Dec 2023 Now diagnosed with severe sleep apnea and presently on AutoPap since December 2023 with marked improvement in overall wellbeing.  Compliance discussed.  Office visit in a year or sooner if problems.    Billy Decamp, MD, [facility] 05/27/2023, 11:19 AM Office: [phone] Fax: [phone] Pager: [phone]

## 2023-06-07 DIAGNOSIS — R69 Illness, unspecified: Secondary | ICD-10-CM | POA: Diagnosis not present

## 2023-06-19 DIAGNOSIS — G4733 Obstructive sleep apnea (adult) (pediatric): Secondary | ICD-10-CM | POA: Diagnosis not present

## 2023-06-24 DIAGNOSIS — L821 Other seborrheic keratosis: Secondary | ICD-10-CM | POA: Diagnosis not present

## 2023-06-24 DIAGNOSIS — X32XXXS Exposure to sunlight, sequela: Secondary | ICD-10-CM | POA: Diagnosis not present

## 2023-06-24 DIAGNOSIS — L578 Other skin changes due to chronic exposure to nonionizing radiation: Secondary | ICD-10-CM | POA: Diagnosis not present

## 2023-06-24 DIAGNOSIS — L814 Other melanin hyperpigmentation: Secondary | ICD-10-CM | POA: Diagnosis not present

## 2023-06-24 DIAGNOSIS — L570 Actinic keratosis: Secondary | ICD-10-CM | POA: Diagnosis not present

## 2023-06-24 DIAGNOSIS — D1801 Hemangioma of skin and subcutaneous tissue: Secondary | ICD-10-CM | POA: Diagnosis not present

## 2023-07-01 ENCOUNTER — Ambulatory Visit (INDEPENDENT_AMBULATORY_CARE_PROVIDER_SITE_OTHER): Payer: Medicare HMO

## 2023-07-01 VITALS — Ht 70.0 in | Wt 191.0 lb

## 2023-07-01 DIAGNOSIS — Z0000 Encounter for general adult medical examination without abnormal findings: Secondary | ICD-10-CM

## 2023-07-01 NOTE — Progress Notes (Signed)
Subjective:   Billy Fernandez is a 72 y.o. male who presents for Medicare Annual/Subsequent preventive examination.  Visit Complete: Virtual  I connected with  Durwin Glaze on 07/01/23 by a audio enabled telemedicine application and verified that I am speaking with the correct person using two identifiers.  Patient Location: Home  Provider Location: Home Office  I discussed the limitations of evaluation and management by telemedicine. The patient expressed understanding and agreed to proceed.  Patient Medicare AWV questionnaire was completed by the patient on 06/24/2023; I have confirmed that all information answered by patient is correct and no changes since this date.  Review of Systems           Objective:    Today's Vitals   07/01/23 0849  Weight: 191 lb (86.6 kg)  Height: 5\' 10"  (1.778 m)   Body mass index is 27.41 kg/m.     07/01/2023    8:58 AM 04/05/2022    3:17 PM  Advanced Directives  Does Patient Have a Medical Advance Directive? Yes Yes  Type of Estate agent of [place];Living will Healthcare Power of [place];Living will  Does patient want to make changes to medical advance directive? No - Patient declined   Copy of Healthcare Power of Attorney in Chart? Yes - validated most recent copy scanned in chart (See row information) Yes - validated most recent copy scanned in chart (See row information)    Current Medications (verified) Outpatient Encounter Medications as of 07/01/2023  Medication Sig   atorvastatin (LIPITOR) 20 MG tablet TAKE 1 TABLET BY MOUTH EVERY DAY   Blood Glucose Calibration (ONETOUCH VERIO) SOLN Use as directed   Blood Glucose Monitoring Suppl (ONETOUCH VERIO REFLECT) w/Device KIT USE TO CHECK BLOOD GLUCOSE DAILY   calcium carbonate (TUMS) 500 MG chewable tablet Chew 2 tablets by mouth at bedtime.   carvedilol (COREG) 6.25 MG tablet TAKE 1 TABLET BY MOUTH TWICE A DAY   CINNAMON PO Take 1,000 mg by mouth daily.    ELIQUIS 5 MG TABS tablet TAKE 1 TABLET BY MOUTH TWICE A DAY   fish oil-omega-3 fatty acids 1000 MG capsule Take 2 g by mouth daily.   Lancets (FREESTYLE) lancets Use to check blood sugars. E11.9   losartan-hydrochlorothiazide (HYZAAR) 100-12.5 MG tablet Take 1 tablet by mouth daily.   metFORMIN (GLUCOPHAGE) 1000 MG tablet TAKE 1 TABLET (1,000 MG TOTAL) BY MOUTH TWICE A DAY WITH FOOD   OneTouch Delica Lancets 30G MISC Use to check blood sugars once a day   ONETOUCH VERIO test strip 1 EACH BY OTHER ROUTE DAILY.   Magnesium 250 MG TABS Take by mouth daily. (Patient not taking: Reported on 07/01/2023)   No facility-administered encounter medications on file as of 07/01/2023.    Allergies (verified) Patient has no known allergies.   History: Past Medical History:  Diagnosis Date   Cervical radiculopathy at C6 04/14/2020   Essential hypertension, benign 05/28/2013   Hyperlipidemia 06/29/2013   Type 2 diabetes mellitus 07/01/2014   History reviewed. No pertinent surgical history. Family History  Problem Relation Age of Onset   Breast cancer Mother    Cancer Mother    Diabetes Mother    Heart disease Father    Hyperlipidemia Father    Hypertension Father    Drug abuse Sister    Diabetes Brother    Heart disease Brother        cabg   Dementia Paternal Grandmother        Possible Alzheimer's  disease   Alcohol abuse Neg Hx    COPD Neg Hx    Depression Neg Hx    Early death Neg Hx    Hearing loss Neg Hx    Kidney disease Neg Hx    Stroke Neg Hx    Social History   Socioeconomic History   Marital status: Married    Spouse name: Not on file   Number of children: 2   Years of education: 12   Highest education level: 12th grade  Occupational History   Occupation: Retired   Occupation: retired Theatre stage manager  Tobacco Use   Smoking status: Former    Packs/day: 0.50    Years: 25.00    Additional pack years: 0.00    Total pack years: 12.50    Types: Cigarettes    Quit date: 05/07/1981     Years since quitting: 42.1   Smokeless tobacco: Never  Vaping Use   Vaping Use: Never used  Substance and Sexual Activity   Alcohol use: Yes    Alcohol/week: 1.0 standard drink of alcohol    Types: 1 Cans of beer per week    Comment: occasionally/rarely   Drug use: Not Currently   Sexual activity: Yes  Other Topics Concern   Not on file  Social History Narrative   Lives in 2 story home with wife   They enjoy travelling and exercising - very independent and active   Social Determinants of Health   Financial Resource Strain: Low Risk  (06/24/2023)   Overall Financial Resource Strain (CARDIA)    Difficulty of Paying Living Expenses: Not hard at all  Food Insecurity: No Food Insecurity (06/24/2023)   Hunger Vital Sign    Worried About Running Out of Food in the Last Year: Never true    Ran Out of Food in the Last Year: Never true  Transportation Needs: No Transportation Needs (06/24/2023)   PRAPARE - Administrator, Civil Service (Medical): No    Lack of Transportation (Non-Medical): No  Physical Activity: Insufficiently Active (06/24/2023)   Exercise Vital Sign    Days of Exercise per Week: 4 days    Minutes of Exercise per Session: 30 min  Stress: No Stress Concern Present (06/24/2023)   [employer] of Occupational Health - Occupational Stress Questionnaire    Feeling of Stress : Not at all  Social Connections: Socially Integrated (06/24/2023)   Social Connection and Isolation Panel [NHANES]    Frequency of Communication with Friends and Family: Once a week    Frequency of Social Gatherings with Friends and Family: Twice a week    Attends Religious Services: More than 4 times per year    Active Member of [employer] or Organizations: Yes    Attends Engineer, structural: More than 4 times per year    Marital Status: Married    Tobacco Counseling Counseling given: Not Answered   Clinical Intake:           BMI - recorded: 27.41 Nutritional Status:  BMI 25 -29 Overweight Nutritional Risks: None Diabetes: Yes CBG done?: No (Per patient-fasting bs 100) Did pt. bring in CBG monitor from home?: No  How often do you need to have someone help you when you read instructions, pamphlets, or other written materials from your doctor or pharmacy?: 1 - Never  Interpreter Needed?: No  Information entered by :: Ermalinda Joubert, RMA   Activities of Daily Living    06/24/2023    9:12 AM  In  your present state of health, do you have any difficulty performing the following activities:  Hearing? 0  Vision? 0  Difficulty concentrating or making decisions? 0  Walking or climbing stairs? 0  Dressing or bathing? 0  Doing errands, shopping? 0  Preparing Food and eating ? N  Using the Toilet? N  In the past six months, have you accidently leaked urine? N  Do you have problems with loss of bowel control? N  Managing your Medications? N  Managing your Finances? N  Housekeeping or managing your Housekeeping? N    Patient Care Team: Pincus Sanes, MD as PCP - General (Internal Medicine) Kathyrn Sheriff, [facility] (Inactive) as Pharmacist (Pharmacist) Yates Decamp, MD as Consulting Physician (Cardiology) Lucinda Dell, MD (Dermatology)  Indicate any recent Medical Services you may have received from other than [facility] providers in the past year (date may be approximate).     Assessment:   This is a routine wellness examination for Norris.  Hearing/Vision screen Hearing Screening - Comments:: Denies any hearing issues.  Dietary issues and exercise activities discussed:     Goals Addressed             This Visit's Progress    Patient Stated   On track    Stay healthy and active - travel more      Depression Screen    07/01/2023    9:02 AM 04/21/2023    9:26 AM 10/18/2022    9:56 AM 04/17/2022    9:06 AM 04/05/2022    3:15 PM 04/16/2021   10:05 AM 04/14/2020    7:55 AM  PHQ 2/9 Scores  PHQ - 2 Score 0 0 0 0 0 0 0  PHQ- 9 Score 0 0          Fall Risk    06/24/2023    9:12 AM 04/21/2023    9:26 AM 10/18/2022    9:56 AM 04/17/2022    9:06 AM 04/05/2022    3:11 PM  Fall Risk   Falls in the past year? 0 0 0 0 0  Number falls in past yr:  0 0 0 0  Injury with Fall?  0 0 0 0  Risk for fall due to : No Fall Risks No Fall Risks No Fall Risks No Fall Risks Orthopedic patient  Follow up Falls prevention discussed;Falls evaluation completed Falls evaluation completed Falls evaluation completed Falls evaluation completed Falls prevention discussed    MEDICARE RISK AT HOME:   TIMED UP AND GO:  Was the test performed?  No    Cognitive Function:        07/01/2023    8:59 AM 04/05/2022    3:12 PM  6CIT Screen  What Year? 0 points 0 points  What month? 0 points 0 points  What time? 0 points 0 points  Count back from 20 0 points 0 points  Months in reverse 0 points 0 points  Repeat phrase 0 points 0 points  Total Score 0 points 0 points    Immunizations Immunization History  Administered Date(s) Administered   Fluad Quad(high Dose 65+) 10/01/2022   Influenza, High Dose Seasonal PF 09/11/2021   PFIZER(Purple Top)SARS-COV-2 Vaccination 02/04/2020, 02/27/2020, 10/30/2020, 07/10/2021   PNEUMOCOCCAL CONJUGATE-20 04/24/2021   Td 07/23/2017   Tdap 05/08/2006   Zoster Recombinant(Shingrix) 11/06/2020, 03/19/2021    Pneumococcal vaccine status: Up to date  Flu Vaccine status: Up to date  Pneumococcal vaccine status: Up to date  Covid-19 vaccine status: Completed  vaccines  Qualifies for Shingles Vaccine? Yes   Zostavax completed Yes   Shingrix Completed?: Yes  Screening Tests Health Maintenance  Topic Date Due   COVID-19 Vaccine (5 - 2023-24 season) 08/23/2022   INFLUENZA VACCINE  07/24/2023   HEMOGLOBIN A1C  10/21/2023   OPHTHALMOLOGY EXAM  11/09/2023   FOOT EXAM  03/18/2024   Diabetic kidney evaluation - eGFR measurement  04/20/2024   Diabetic kidney evaluation - Urine ACR  04/20/2024   Medicare Annual  Wellness (AWV)  06/30/2024   Colonoscopy  07/16/2027   DTaP/Tdap/Td (3 - Td or Tdap) 07/24/2027   Pneumonia Vaccine 30+ Years old  Completed   Hepatitis C Screening  Completed   Zoster Vaccines- Shingrix  Completed   HPV VACCINES  Aged Out    Health Maintenance  Health Maintenance Due  Topic Date Due   COVID-19 Vaccine (5 - 2023-24 season) 08/23/2022    Colorectal cancer screening: Type of screening: Colonoscopy. Completed 07/15/2022. Repeat every 5 years  Lung Cancer Screening: (Low Dose CT Chest recommended if Age 35-80 years, 20 pack-year currently smoking OR have quit w/in 15years.) does not qualify.   Lung Cancer Screening Referral: N/A  Additional Screening:  Hepatitis C Screening: does qualify; Completed 05/28/2013  Vision Screening: Recommended annual ophthalmology exams for early detection of glaucoma and other disorders of the eye. Is the patient up to date with their annual eye exam?  Yes  Who is the provider or what is the name of the office in which the patient attends annual eye exams? [facility] If pt is not established with a provider, would they like to be referred to a provider to establish care? No .   Dental Screening: Recommended annual dental exams for proper oral hygiene  Diabetic Foot Exam: Diabetic Foot Exam: Completed 04/21/2023  Community Resource Referral / Chronic Care Management: CRR required this visit?  No   CCM required this visit?  No     Plan:     I have personally reviewed and noted the following in the patient's chart:   Medical and social history Use of alcohol, tobacco or illicit drugs  Current medications and supplements including opioid prescriptions. Patient is not currently taking opioid prescriptions. Functional ability and status Nutritional status Physical activity Advanced directives List of other physicians Hospitalizations, surgeries, and ER visits in previous 12 months Vitals Screenings to include cognitive,  depression, and falls Referrals and appointments  In addition, I have reviewed and discussed with patient certain preventive protocols, quality metrics, and best practice recommendations. A written personalized care plan for preventive services as well as general preventive health recommendations were provided to patient.     Daniele Yankowski L Consuela Widener, CMA   07/01/2023   After Visit Summary: (MyChart) Due to this being a telephonic visit, the after visit summary with patients personalized plan was offered to patient via MyChart   Nurse Notes: Patient is doing well with no complaints.

## 2023-07-01 NOTE — Patient Instructions (Signed)
[name] , Thank you for taking time to come for your Medicare Wellness Visit. I appreciate your ongoing commitment to your health goals. Please review the following plan we discussed and let me know if I can assist you in the future.   These are the goals we discussed:  Goals      Patient Stated     Stay healthy and active - travel more        This is a list of the screening recommended for you and due dates:  Health Maintenance  Topic Date Due   COVID-19 Vaccine (5 - 2023-24 season) 08/23/2022   Flu Shot  07/24/2023   Hemoglobin A1C  10/21/2023   Eye exam for diabetics  11/09/2023   Complete foot exam   03/18/2024   Yearly kidney function blood test for diabetes  04/20/2024   Yearly kidney health urinalysis for diabetes  04/20/2024   Medicare Annual Wellness Visit  06/30/2024   Colon Cancer Screening  07/16/2027   DTaP/Tdap/Td vaccine (3 - Td or Tdap) 07/24/2027   Pneumonia Vaccine  Completed   Hepatitis C Screening  Completed   Zoster (Shingles) Vaccine  Completed   HPV Vaccine  Aged Out    Advanced directives: Copies are in your chart.  Conditions/risks identified: Keep up the good work.  Next appointment: Follow up in one year for your annual wellness visit.   Preventive Care 21 Years and Older, Male  Preventive care refers to lifestyle choices and visits with your health care provider that can promote health and wellness. What does preventive care include? A yearly physical exam. This is also called an annual well check. Dental exams once or twice a year. Routine eye exams. Ask your health care provider how often you should have your eyes checked. Personal lifestyle choices, including: Daily care of your teeth and gums. Regular physical activity. Eating a healthy diet. Avoiding tobacco and drug use. Limiting alcohol use. Practicing safe sex. Taking low doses of aspirin every day. Taking vitamin and mineral supplements as recommended by your health care  provider. What happens during an annual well check? The services and screenings done by your health care provider during your annual well check will depend on your age, overall health, lifestyle risk factors, and family history of disease. Counseling  Your health care provider may ask you questions about your: Alcohol use. Tobacco use. Drug use. Emotional well-being. Home and relationship well-being. Sexual activity. Eating habits. History of falls. Memory and ability to understand (cognition). Work and work Astronomer. Screening  You may have the following tests or measurements: Height, weight, and BMI. Blood pressure. Lipid and cholesterol levels. These may be checked every 5 years, or more frequently if you are over 24 years old. Skin check. Lung cancer screening. You may have this screening every year starting at age 63 if you have a 30-pack-year history of smoking and currently smoke or have quit within the past 15 years. Fecal occult blood test (FOBT) of the stool. You may have this test every year starting at age 70. Flexible sigmoidoscopy or colonoscopy. You may have a sigmoidoscopy every 5 years or a colonoscopy every 10 years starting at age 71. Prostate cancer screening. Recommendations will vary depending on your family history and other risks. Hepatitis C blood test. Hepatitis B blood test. Sexually transmitted disease (STD) testing. Diabetes screening. This is done by checking your blood sugar (glucose) after you have not eaten for a while (fasting). You may have this done  every 1-3 years. Abdominal aortic aneurysm (AAA) screening. You may need this if you are a current or former smoker. Osteoporosis. You may be screened starting at age 55 if you are at high risk. Talk with your health care provider about your test results, treatment options, and if necessary, the need for more tests. Vaccines  Your health care provider may recommend certain vaccines, such  as: Influenza vaccine. This is recommended every year. Tetanus, diphtheria, and acellular pertussis (Tdap, Td) vaccine. You may need a Td booster every 10 years. Zoster vaccine. You may need this after age 9. Pneumococcal 13-valent conjugate (PCV13) vaccine. One dose is recommended after age 2. Pneumococcal polysaccharide (PPSV23) vaccine. One dose is recommended after age 53. Talk to your health care provider about which screenings and vaccines you need and how often you need them. This information is not intended to replace advice given to you by your health care provider. Make sure you discuss any questions you have with your health care provider. Document Released: 01/05/2016 Document Revised: 08/28/2016 Document Reviewed: 10/10/2015 Elsevier Interactive Patient Education  2017 [employer].  Fall Prevention in the Home Falls can cause injuries. They can happen to people of all ages. There are many things you can do to make your home safe and to help prevent falls. What can I do on the outside of my home? Regularly fix the edges of walkways and driveways and fix any cracks. Remove anything that might make you trip as you walk through a door, such as a raised step or threshold. Trim any bushes or trees on the path to your home. Use bright outdoor lighting. Clear any walking paths of anything that might make someone trip, such as rocks or tools. Regularly check to see if handrails are loose or broken. Make sure that both sides of any steps have handrails. Any raised decks and porches should have guardrails on the edges. Have any leaves, snow, or ice cleared regularly. Use sand or salt on walking paths during winter. Clean up any spills in your garage right away. This includes oil or grease spills. What can I do in the bathroom? Use night lights. Install grab bars by the toilet and in the tub and shower. Do not use towel bars as grab bars. Use non-skid mats or decals in the tub or  shower. If you need to sit down in the shower, use a plastic, non-slip stool. Keep the floor dry. Clean up any water that spills on the floor as soon as it happens. Remove soap buildup in the tub or shower regularly. Attach bath mats securely with double-sided non-slip rug tape. Do not have throw rugs and other things on the floor that can make you trip. What can I do in the bedroom? Use night lights. Make sure that you have a light by your bed that is easy to reach. Do not use any sheets or blankets that are too big for your bed. They should not hang down onto the floor. Have a firm chair that has side arms. You can use this for support while you get dressed. Do not have throw rugs and other things on the floor that can make you trip. What can I do in the kitchen? Clean up any spills right away. Avoid walking on wet floors. Keep items that you use a lot in easy-to-reach places. If you need to reach something above you, use a strong step stool that has a grab bar. Keep electrical cords out of the  way. Do not use floor polish or wax that makes floors slippery. If you must use wax, use non-skid floor wax. Do not have throw rugs and other things on the floor that can make you trip. What can I do with my stairs? Do not leave any items on the stairs. Make sure that there are handrails on both sides of the stairs and use them. Fix handrails that are broken or loose. Make sure that handrails are as long as the stairways. Check any carpeting to make sure that it is firmly attached to the stairs. Fix any carpet that is loose or worn. Avoid having throw rugs at the top or bottom of the stairs. If you do have throw rugs, attach them to the floor with carpet tape. Make sure that you have a light switch at the top of the stairs and the bottom of the stairs. If you do not have them, ask someone to add them for you. What else can I do to help prevent falls? Wear shoes that: Do not have high heels. Have  rubber bottoms. Are comfortable and fit you well. Are closed at the toe. Do not wear sandals. If you use a stepladder: Make sure that it is fully opened. Do not climb a closed stepladder. Make sure that both sides of the stepladder are locked into place. Ask someone to hold it for you, if possible. Clearly mark and make sure that you can see: Any grab bars or handrails. First and last steps. Where the edge of each step is. Use tools that help you move around (mobility aids) if they are needed. These include: Canes. Walkers. Scooters. Crutches. Turn on the lights when you go into a dark area. Replace any light bulbs as soon as they burn out. Set up your furniture so you have a clear path. Avoid moving your furniture around. If any of your floors are uneven, fix them. If there are any pets around you, be aware of where they are. Review your medicines with your doctor. Some medicines can make you feel dizzy. This can increase your chance of falling. Ask your doctor what other things that you can do to help prevent falls. This information is not intended to replace advice given to you by your health care provider. Make sure you discuss any questions you have with your health care provider. Document Released: 10/05/2009 Document Revised: 05/16/2016 Document Reviewed: 01/13/2015 Elsevier Interactive Patient Education  2017 [employer].

## 2023-07-11 DIAGNOSIS — G4733 Obstructive sleep apnea (adult) (pediatric): Secondary | ICD-10-CM | POA: Diagnosis not present

## 2023-07-19 DIAGNOSIS — G4733 Obstructive sleep apnea (adult) (pediatric): Secondary | ICD-10-CM | POA: Diagnosis not present

## 2023-08-11 DIAGNOSIS — G4733 Obstructive sleep apnea (adult) (pediatric): Secondary | ICD-10-CM | POA: Diagnosis not present

## 2023-08-19 DIAGNOSIS — G4733 Obstructive sleep apnea (adult) (pediatric): Secondary | ICD-10-CM | POA: Diagnosis not present

## 2023-09-03 ENCOUNTER — Other Ambulatory Visit: Payer: Self-pay | Admitting: Cardiology

## 2023-09-03 ENCOUNTER — Other Ambulatory Visit: Payer: Self-pay | Admitting: Internal Medicine

## 2023-09-03 DIAGNOSIS — I480 Paroxysmal atrial fibrillation: Secondary | ICD-10-CM

## 2023-09-04 ENCOUNTER — Encounter: Payer: Self-pay | Admitting: Cardiology

## 2023-09-11 DIAGNOSIS — G4733 Obstructive sleep apnea (adult) (pediatric): Secondary | ICD-10-CM | POA: Diagnosis not present

## 2023-09-11 NOTE — Telephone Encounter (Signed)
From patient.

## 2023-09-19 DIAGNOSIS — G4733 Obstructive sleep apnea (adult) (pediatric): Secondary | ICD-10-CM | POA: Diagnosis not present

## 2023-10-12 ENCOUNTER — Other Ambulatory Visit: Payer: Self-pay | Admitting: Internal Medicine

## 2023-10-13 ENCOUNTER — Encounter: Payer: Self-pay | Admitting: Cardiology

## 2023-10-13 ENCOUNTER — Encounter: Payer: Self-pay | Admitting: Internal Medicine

## 2023-10-13 DIAGNOSIS — Z0100 Encounter for examination of eyes and vision without abnormal findings: Secondary | ICD-10-CM | POA: Diagnosis not present

## 2023-10-13 DIAGNOSIS — I480 Paroxysmal atrial fibrillation: Secondary | ICD-10-CM

## 2023-10-13 MED ORDER — APIXABAN 5 MG PO TABS
5.0000 mg | ORAL_TABLET | Freq: Two times a day (BID) | ORAL | Status: DC
Start: 2023-10-13 — End: 2023-12-30

## 2023-10-13 NOTE — Telephone Encounter (Signed)
Leaving pt a [employer] application along with 2 weeks of Eliquis 5 mg tablets samples at [facility] front desk for pt to pick up. Pt verbalized understanding. FYI

## 2023-10-14 DIAGNOSIS — G4733 Obstructive sleep apnea (adult) (pediatric): Secondary | ICD-10-CM | POA: Diagnosis not present

## 2023-10-14 NOTE — Telephone Encounter (Signed)
Medicine has been picked up.

## 2023-10-14 NOTE — Telephone Encounter (Signed)
Pt's Billy Fernandez patient assistance application was scanned to [employer], [facility] email. FYI

## 2023-10-17 ENCOUNTER — Telehealth: Payer: Self-pay

## 2023-10-17 ENCOUNTER — Other Ambulatory Visit (HOSPITAL_COMMUNITY): Payer: Self-pay

## 2023-10-17 NOTE — Telephone Encounter (Signed)
Received documents for PAP for Eliquis. Missing 3% out of pocket expense report.

## 2023-10-20 ENCOUNTER — Other Ambulatory Visit (HOSPITAL_COMMUNITY): Payer: Self-pay

## 2023-10-20 ENCOUNTER — Encounter: Payer: Self-pay | Admitting: Internal Medicine

## 2023-10-20 DIAGNOSIS — I2510 Atherosclerotic heart disease of native coronary artery without angina pectoris: Secondary | ICD-10-CM | POA: Insufficient documentation

## 2023-10-20 NOTE — Patient Instructions (Addendum)
      Blood work was ordered.   The lab is on the first floor.    Medications changes include :   none    A referral was ordered and someone will call you to schedule an appointment.     Return in about 6 months (around 04/20/2024) for Physical Exam.

## 2023-10-20 NOTE — Progress Notes (Unsigned)
Subjective:    Patient ID: Billy Fernandez, male    DOB: 04/27/1951, 72 y.o.   MRN: [id number]     HPI Billy Fernandez is here for follow up of his chronic medical problems.  Doing well - no concerns - exercising regularly, compliant with dm diet.   Sugar always less than 130.  This am sugars was 105.   Medications and allergies reviewed with patient and updated if appropriate.  Current Outpatient Medications on File Prior to Visit  Medication Sig Dispense Refill   apixaban (ELIQUIS) 5 MG TABS tablet Take 1 tablet (5 mg total) by mouth 2 (two) times daily. 28 tablet    atorvastatin (LIPITOR) 20 MG tablet TAKE 1 TABLET BY MOUTH EVERY DAY 90 tablet 2   Blood Glucose Calibration (ONETOUCH VERIO) SOLN Use as directed 3 each 3   Blood Glucose Monitoring Suppl (ONETOUCH VERIO REFLECT) w/Device KIT USE TO CHECK BLOOD GLUCOSE DAILY 1 kit 0   calcium carbonate (TUMS) 500 MG chewable tablet Chew 2 tablets by mouth at bedtime.     carvedilol (COREG) 6.25 MG tablet TAKE 1 TABLET BY MOUTH TWICE A DAY 180 tablet 2   CINNAMON PO Take 1,000 mg by mouth daily.     fish oil-omega-3 fatty acids 1000 MG capsule Take 2 g by mouth daily.     Lancets (FREESTYLE) lancets Use to check blood sugars. E11.9 100 each 1   Lancets (ONETOUCH DELICA PLUS LANCET30G) MISC USE TO CHECK BLOOD SUGARS ONCE A DAY 100 each 3   losartan-hydrochlorothiazide (HYZAAR) 100-12.5 MG tablet Take 1 tablet by mouth daily. 90 tablet 3   metFORMIN (GLUCOPHAGE) 1000 MG tablet TAKE 1 TABLET (1,000 MG TOTAL) BY MOUTH TWICE A DAY WITH FOOD 180 tablet 2   ONETOUCH VERIO test strip 1 EACH BY OTHER ROUTE DAILY. 100 strip 2   No current facility-administered medications on file prior to visit.     Review of Systems  Constitutional:  Negative for fever.  Respiratory:  Negative for cough, shortness of breath and wheezing.   Cardiovascular:  Positive for palpitations (chronic - no change). Negative for chest pain and leg swelling.   Neurological:  Negative for light-headedness and headaches.       Objective:   Vitals:   10/21/23 0838  BP: 130/76  Pulse: 71  Temp: 98.3 F (36.8 C)  SpO2: 97%   BP Readings from Last 3 Encounters:  10/21/23 130/76  05/27/23 118/76  04/21/23 126/80   Wt Readings from Last 3 Encounters:  10/21/23 193 lb (87.5 kg)  07/01/23 191 lb (86.6 kg)  05/27/23 196 lb (88.9 kg)   Body mass index is 27.69 kg/m.    Physical Exam Constitutional:      General: He is not in acute distress.    Appearance: Normal appearance. He is not ill-appearing.  HENT:     Head: Normocephalic and atraumatic.  Eyes:     Conjunctiva/sclera: Conjunctivae normal.  Cardiovascular:     Rate and Rhythm: Normal rate and regular rhythm.     Heart sounds: Normal heart sounds.  Pulmonary:     Effort: Pulmonary effort is normal. No respiratory distress.     Breath sounds: Normal breath sounds. No wheezing or rales.  Musculoskeletal:     Right lower leg: No edema.     Left lower leg: No edema.  Skin:    General: Skin is warm and dry.     Findings: No rash.  Neurological:  Mental Status: He is alert. Mental status is at baseline.  Psychiatric:        Mood and Affect: Mood normal.        Lab Results  Component Value Date   WBC 6.1 04/21/2023   HGB 15.8 04/21/2023   HCT 46.4 04/21/2023   PLT 242.0 04/21/2023   GLUCOSE 104 (H) 04/21/2023   CHOL 125 04/21/2023   TRIG 97.0 04/21/2023   HDL 48.60 04/21/2023   LDLCALC 57 04/21/2023   ALT 21 04/21/2023   AST 18 04/21/2023   NA 141 04/21/2023   K 5.0 04/21/2023   CL 103 04/21/2023   CREATININE 1.04 04/21/2023   BUN 18 04/21/2023   CO2 29 04/21/2023   TSH 1.12 04/21/2023   PSA 1.48 04/21/2023   HGBA1C 6.7 (H) 04/21/2023   MICROALBUR <0.7 04/21/2023     Assessment & Plan:    See Problem List for Assessment and Plan of chronic medical problems.

## 2023-10-20 NOTE — Telephone Encounter (Signed)
PAP: Application for ELIQUIS has been submitted to PAP Companies: [employer], via fax If status update is requested, please refer to BMS at [phone]

## 2023-10-21 ENCOUNTER — Ambulatory Visit (INDEPENDENT_AMBULATORY_CARE_PROVIDER_SITE_OTHER): Payer: Medicare HMO | Admitting: Internal Medicine

## 2023-10-21 VITALS — BP 130/76 | HR 71 | Temp 98.3°F | Ht 70.0 in | Wt 193.0 lb

## 2023-10-21 DIAGNOSIS — I480 Paroxysmal atrial fibrillation: Secondary | ICD-10-CM

## 2023-10-21 DIAGNOSIS — I10 Essential (primary) hypertension: Secondary | ICD-10-CM

## 2023-10-21 DIAGNOSIS — E119 Type 2 diabetes mellitus without complications: Secondary | ICD-10-CM | POA: Diagnosis not present

## 2023-10-21 DIAGNOSIS — I2510 Atherosclerotic heart disease of native coronary artery without angina pectoris: Secondary | ICD-10-CM | POA: Diagnosis not present

## 2023-10-21 DIAGNOSIS — Z7984 Long term (current) use of oral hypoglycemic drugs: Secondary | ICD-10-CM

## 2023-10-21 DIAGNOSIS — G4733 Obstructive sleep apnea (adult) (pediatric): Secondary | ICD-10-CM

## 2023-10-21 DIAGNOSIS — E782 Mixed hyperlipidemia: Secondary | ICD-10-CM | POA: Diagnosis not present

## 2023-10-21 LAB — CBC WITH DIFFERENTIAL/PLATELET
Basophils Absolute: 0 10*3/uL (ref 0.0–0.1)
Basophils Relative: 0.5 % (ref 0.0–3.0)
Eosinophils Absolute: 0.2 10*3/uL (ref 0.0–0.7)
Eosinophils Relative: 3.1 % (ref 0.0–5.0)
HCT: 48.8 % (ref 39.0–52.0)
Hemoglobin: 16 g/dL (ref 13.0–17.0)
Lymphocytes Relative: 24.4 % (ref 12.0–46.0)
Lymphs Abs: 1.6 10*3/uL (ref 0.7–4.0)
MCHC: 32.7 g/dL (ref 30.0–36.0)
MCV: 90.9 fL (ref 78.0–100.0)
Monocytes Absolute: 0.6 10*3/uL (ref 0.1–1.0)
Monocytes Relative: 9.1 % (ref 3.0–12.0)
Neutro Abs: 4.1 10*3/uL (ref 1.4–7.7)
Neutrophils Relative %: 62.9 % (ref 43.0–77.0)
Platelets: 243 10*3/uL (ref 150.0–400.0)
RBC: 5.36 Mil/uL (ref 4.22–5.81)
RDW: 13.5 % (ref 11.5–15.5)
WBC: 6.5 10*3/uL (ref 4.0–10.5)

## 2023-10-21 LAB — COMPREHENSIVE METABOLIC PANEL
ALT: 20 U/L (ref 0–53)
AST: 20 U/L (ref 0–37)
Albumin: 4.4 g/dL (ref 3.5–5.2)
Alkaline Phosphatase: 35 U/L — ABNORMAL LOW (ref 39–117)
BUN: 24 mg/dL — ABNORMAL HIGH (ref 6–23)
CO2: 29 meq/L (ref 19–32)
Calcium: 9.7 mg/dL (ref 8.4–10.5)
Chloride: 105 meq/L (ref 96–112)
Creatinine, Ser: 0.99 mg/dL (ref 0.40–1.50)
GFR: 76.52 mL/min (ref >60.00)
Glucose, Bld: 103 mg/dL — ABNORMAL HIGH (ref 70–99)
Potassium: 4.1 meq/L (ref 3.5–5.1)
Sodium: 142 meq/L (ref 135–145)
Total Bilirubin: 0.8 mg/dL (ref 0.2–1.2)
Total Protein: 7.3 g/dL (ref 6.0–8.3)

## 2023-10-21 LAB — LIPID PANEL
Cholesterol: 136 mg/dL (ref 0–200)
HDL: 52.6 mg/dL (ref >39.00)
LDL Cholesterol: 63 mg/dL (ref 0–99)
NonHDL: 83.45
Total CHOL/HDL Ratio: 3
Triglycerides: 103 mg/dL (ref 0.0–149.0)
VLDL: 20.6 mg/dL (ref 0.0–40.0)

## 2023-10-21 LAB — HEMOGLOBIN A1C: Hgb A1c MFr Bld: 6.7 % — ABNORMAL HIGH (ref 4.6–6.5)

## 2023-10-21 MED ORDER — DEXCOM G7 SENSOR MISC: 5 refills | Status: DC

## 2023-10-21 MED ORDER — DEXCOM G7 RECEIVER DEVI: 0 refills | Status: DC

## 2023-10-21 NOTE — Assessment & Plan Note (Signed)
Chronic Blood pressure well controlled CMP, CBC Continue Coreg 6.25 mg twice daily, losartan-HCT 100-25 mg daily

## 2023-10-21 NOTE — Assessment & Plan Note (Signed)
Chronic Lab Results  Component Value Date   HGBA1C 6.7 (H) 04/21/2023   Sugars controlled Continue metformin 1000 mg twice daily Continue diabetic diet, regular exercise He has significantly improved his lifestyle Would like to ideally start him on an SGLT2 inhibitor, but co-pay is too high

## 2023-10-21 NOTE — Assessment & Plan Note (Signed)
Chronic CAC score 129 9 following with cardiology On Eliquis 5 mg twice daily, atorvastatin 20 mg daily, Coreg 6.25 mg twice daily Lipids, CMP, CBC

## 2023-10-21 NOTE — Assessment & Plan Note (Signed)
Chronic Uses cpap nightly

## 2023-10-21 NOTE — Assessment & Plan Note (Signed)
Chronic Regular exercise and healthy diet  Check lipid panel  Continue atorvastatin 20 mg daily  Lab Results  Component Value Date   LDLCALC 57 04/21/2023

## 2023-10-21 NOTE — Assessment & Plan Note (Signed)
Chronic Following with cardiology Having intermittent afib On Eliquis 5 mg twice daily, Coreg 6.25 mg twice daily CBC, CMP

## 2023-10-22 NOTE — Telephone Encounter (Signed)
PAP: Patient has been denied for pt assistance by PAP Companies: [employer] due to 3% out-of-pocket prescription expenses, based on household adjusted gross income, not met. A letter has been sent to patient regarding decision.

## 2023-10-24 ENCOUNTER — Other Ambulatory Visit: Payer: Self-pay | Admitting: Cardiology

## 2023-10-24 DIAGNOSIS — I10 Essential (primary) hypertension: Secondary | ICD-10-CM

## 2023-10-29 ENCOUNTER — Other Ambulatory Visit (HOSPITAL_COMMUNITY): Payer: Self-pay

## 2023-10-29 ENCOUNTER — Telehealth: Payer: Self-pay

## 2023-10-29 ENCOUNTER — Encounter: Payer: Self-pay | Admitting: Internal Medicine

## 2023-10-29 ENCOUNTER — Telehealth: Payer: Self-pay | Admitting: Cardiology

## 2023-10-29 NOTE — Telephone Encounter (Signed)
Pt's wife states that the pt accidentally took a double dose of all his medications this morning. Please advise

## 2023-10-29 NOTE — Telephone Encounter (Signed)
My-chart message sent with response back.

## 2023-10-29 NOTE — Telephone Encounter (Signed)
Pt's wife also states she would like to talk with Olegario Messier she states she was helping with financial assistance. Please advise

## 2023-10-29 NOTE — Telephone Encounter (Signed)
Spoke with the patient's daughter who reports that the patient took both his morning and evening doses of his medications this morning around 6am. She has already called poison control who advised that he did not take a toxic dose of anything and recommended that he not take any of his evening doses tonight. They advised that he call his provider just in case.  The patient took the following medications this morning: Eliquis 5 mg - 2 tablets  Metformin 1000 mg - 2 tablets Carvedilol 6.25 mg - 2 tablets  Hyzaar 100-12.5 mg - 1 tablet  Advised daughter to monitor the patient's heart rate, blood pressure and blood sugar throughout the day. Make sure he is staying hydrated. He has been drinking plenty of water this morning. He did have breakfast as well. His blood pressure was 116/67 and heart rate was 62. Will make PharmD and Dr. Jacinto Halim aware to see if they have any further recommendations.

## 2023-10-29 NOTE — Telephone Encounter (Signed)
I agree with poison control recommendations. Dont take evening meds. His blood sugar should not drop. Keep eye on BP and HR, but he should be fine to resume meds tomorrow AM

## 2023-10-29 NOTE — Telephone Encounter (Signed)
Spoke with wife and she is aware to continue to monitor BP and heart rate. He will not take any medications today or this evening. He will resume all medications tomorrow. She verbalized undertsanding.

## 2023-11-03 ENCOUNTER — Telehealth: Payer: Self-pay

## 2023-11-04 ENCOUNTER — Other Ambulatory Visit (HOSPITAL_COMMUNITY): Payer: Self-pay

## 2023-11-05 ENCOUNTER — Telehealth: Payer: Self-pay

## 2023-11-05 ENCOUNTER — Other Ambulatory Visit (HOSPITAL_COMMUNITY): Payer: Self-pay

## 2023-11-05 NOTE — Telephone Encounter (Signed)
Pharmacy Patient Advocate Encounter   Received notification from Pt Calls Messages that prior authorization for Dexcom G7 receiver is required/requested.   Insurance verification completed.   The patient is insured through [facility] .   Per test claim: PA required; PA submitted to above mentioned insurance via Fax Key/confirmation #/EOC -- Status is pending   Fax# [phone] Phone# [phone]

## 2023-11-06 NOTE — Telephone Encounter (Signed)
Pharmacy Patient Advocate Encounter  Received notification from [facility] that Prior Authorization for [facility] G7 receiver  has been APPROVED from 11/05/23 to 12/23/23   PA #/Case ID/Reference #: Billy Fernandez

## 2023-11-06 NOTE — Telephone Encounter (Signed)
Patient notified via mychart

## 2023-11-11 DIAGNOSIS — E119 Type 2 diabetes mellitus without complications: Secondary | ICD-10-CM | POA: Diagnosis not present

## 2023-11-11 DIAGNOSIS — H524 Presbyopia: Secondary | ICD-10-CM | POA: Diagnosis not present

## 2023-11-14 DIAGNOSIS — G4733 Obstructive sleep apnea (adult) (pediatric): Secondary | ICD-10-CM | POA: Diagnosis not present

## 2023-11-28 ENCOUNTER — Other Ambulatory Visit: Payer: Self-pay | Admitting: Internal Medicine

## 2023-12-04 NOTE — Telephone Encounter (Signed)
Approved.  

## 2023-12-06 ENCOUNTER — Other Ambulatory Visit: Payer: Self-pay | Admitting: Internal Medicine

## 2023-12-08 ENCOUNTER — Other Ambulatory Visit: Payer: Self-pay | Admitting: Internal Medicine

## 2023-12-14 DIAGNOSIS — G4733 Obstructive sleep apnea (adult) (pediatric): Secondary | ICD-10-CM | POA: Diagnosis not present

## 2023-12-23 ENCOUNTER — Encounter: Payer: Self-pay | Admitting: Internal Medicine

## 2023-12-25 ENCOUNTER — Other Ambulatory Visit: Payer: Self-pay

## 2023-12-25 DIAGNOSIS — E119 Type 2 diabetes mellitus without complications: Secondary | ICD-10-CM

## 2023-12-25 MED ORDER — DEXCOM G7 RECEIVER DEVI: 0 refills | Status: DC

## 2023-12-25 MED ORDER — DEXCOM G7 SENSOR MISC: 5 refills | Status: DC

## 2023-12-29 ENCOUNTER — Telehealth: Payer: Self-pay

## 2023-12-29 NOTE — Telephone Encounter (Signed)
 Copied from CRM [id number]. Topic: Clinical - Prescription Issue >> Dec 29, 2023 11:31 AM Joen NOVAK wrote:  Reason for CRM: PATIENT NEEDS MEDICATION UPDATED FROM A 30 DAY SUPPLY TO A 90 DAYS SUPPLY FOR ELIQUIS . SHE ALSO WANTS PHARMACY CHANGED TO [facility] SERVICE PHONE # [phone]. SHE WANTS ONE YEAR REFILLABLE'S ON ALL MEDICATIONS. SPOKE WITH WIFE MICHELLE

## 2023-12-30 ENCOUNTER — Other Ambulatory Visit: Payer: Self-pay

## 2023-12-30 ENCOUNTER — Encounter: Payer: Self-pay | Admitting: Cardiology

## 2023-12-30 DIAGNOSIS — I480 Paroxysmal atrial fibrillation: Secondary | ICD-10-CM

## 2023-12-30 MED ORDER — METFORMIN HCL 1000 MG PO TABS: 1000.0000 mg | ORAL_TABLET | Freq: Two times a day (BID) | ORAL | 2 refills | Status: DC

## 2023-12-30 MED ORDER — APIXABAN 5 MG PO TABS: 5.0000 mg | ORAL_TABLET | Freq: Two times a day (BID) | ORAL | 1 refills | Status: DC

## 2023-12-30 MED ORDER — CARVEDILOL 6.25 MG PO TABS: 6.2500 mg | ORAL_TABLET | Freq: Two times a day (BID) | ORAL | 2 refills | Status: DC

## 2023-12-30 MED ORDER — ATORVASTATIN CALCIUM 20 MG PO TABS: 20.0000 mg | ORAL_TABLET | Freq: Every day | ORAL | 2 refills | Status: DC

## 2023-12-30 NOTE — Telephone Encounter (Signed)
 Prescription refill request for Eliquis received. Indication: Afib  Last office visit:05/27/23 Nadara Eaton)  Scr: 0.99 (10/21/23)  Age: 73 Weight: 87.5kg  Appropriate dose. Refill sent.

## 2023-12-30 NOTE — Telephone Encounter (Signed)
 My-chart sent to patient. Dr. Nadara Eaton is currently managing refills and should be sent by him.

## 2024-01-01 ENCOUNTER — Encounter: Payer: Self-pay | Admitting: Internal Medicine

## 2024-01-01 ENCOUNTER — Other Ambulatory Visit: Payer: Self-pay

## 2024-01-01 DIAGNOSIS — D1801 Hemangioma of skin and subcutaneous tissue: Secondary | ICD-10-CM | POA: Diagnosis not present

## 2024-01-01 DIAGNOSIS — L821 Other seborrheic keratosis: Secondary | ICD-10-CM | POA: Diagnosis not present

## 2024-01-01 DIAGNOSIS — L570 Actinic keratosis: Secondary | ICD-10-CM | POA: Diagnosis not present

## 2024-01-01 DIAGNOSIS — D692 Other nonthrombocytopenic purpura: Secondary | ICD-10-CM | POA: Diagnosis not present

## 2024-01-01 DIAGNOSIS — L814 Other melanin hyperpigmentation: Secondary | ICD-10-CM | POA: Diagnosis not present

## 2024-01-01 DIAGNOSIS — E119 Type 2 diabetes mellitus without complications: Secondary | ICD-10-CM

## 2024-01-01 MED ORDER — BLOOD GLUCOSE MONITOR KIT: PACK | 0 refills | Status: DC

## 2024-01-02 ENCOUNTER — Encounter: Payer: Self-pay | Admitting: Cardiology

## 2024-01-02 ENCOUNTER — Telehealth: Payer: Self-pay | Admitting: Cardiology

## 2024-01-02 DIAGNOSIS — I10 Essential (primary) hypertension: Secondary | ICD-10-CM

## 2024-01-02 NOTE — Telephone Encounter (Signed)
*  STAT* If patient is at the pharmacy, call can be transferred to refill team.   1. Which medications need to be refilled? (please list name of each medication and dose if known) osartan-hydrochlorothiazide  (HYZAAR) 100-12.5 MG tablet   2. Which pharmacy/location (including street and city if local pharmacy) is medication to be sent to? [facility] - [place], [place] - [address] Phone: [phone]  Fax: [phone]      3. Do they need a 30 day or 90 day supply? 90

## 2024-01-05 MED ORDER — LOSARTAN POTASSIUM-HCTZ 100-12.5 MG PO TABS: 1.0000 | ORAL_TABLET | Freq: Every day | ORAL | 1 refills | Status: DC

## 2024-01-22 DIAGNOSIS — G4733 Obstructive sleep apnea (adult) (pediatric): Secondary | ICD-10-CM | POA: Diagnosis not present

## 2024-02-20 DIAGNOSIS — G4733 Obstructive sleep apnea (adult) (pediatric): Secondary | ICD-10-CM | POA: Diagnosis not present

## 2024-02-25 ENCOUNTER — Other Ambulatory Visit (HOSPITAL_COMMUNITY): Payer: Self-pay

## 2024-02-25 ENCOUNTER — Encounter: Payer: Self-pay | Admitting: Cardiology

## 2024-02-25 ENCOUNTER — Telehealth: Payer: Self-pay

## 2024-02-25 ENCOUNTER — Encounter: Payer: Self-pay | Admitting: Internal Medicine

## 2024-02-25 DIAGNOSIS — I480 Paroxysmal atrial fibrillation: Secondary | ICD-10-CM

## 2024-02-25 NOTE — Telephone Encounter (Signed)
 Spoke to patient's wife regarding Eliquis. Explained to her that per test claim, if they pick up the Eliquis refill at pharmacy, he will meet his deductible and the cost after should be $45 per 30 D/S. Encouraged wife to enroll patient in MPP if they can't afford to pick up the current fill due to deductible. Patient requested samples in the meantime. Per Dr. Lawerance Bach note, [facility] is agreeing to supply some samples for now while wife reaches out to insurance. Patient is aware.

## 2024-02-25 NOTE — Telephone Encounter (Signed)
 See phone note from today.

## 2024-02-26 MED ORDER — APIXABAN 5 MG PO TABS
5.0000 mg | ORAL_TABLET | Freq: Two times a day (BID) | ORAL | Status: DC
Start: 2024-02-26 — End: 2024-10-04

## 2024-02-26 NOTE — Telephone Encounter (Signed)
 Patient wife picked up 4 weeks supply of 5mg  Elilquis samples today.

## 2024-03-01 ENCOUNTER — Ambulatory Visit (INDEPENDENT_AMBULATORY_CARE_PROVIDER_SITE_OTHER)

## 2024-03-01 ENCOUNTER — Other Ambulatory Visit: Payer: Self-pay | Admitting: Podiatry

## 2024-03-01 ENCOUNTER — Ambulatory Visit: Payer: Medicare Other | Admitting: Podiatry

## 2024-03-01 DIAGNOSIS — M7661 Achilles tendinitis, right leg: Secondary | ICD-10-CM | POA: Diagnosis not present

## 2024-03-01 DIAGNOSIS — M722 Plantar fascial fibromatosis: Secondary | ICD-10-CM

## 2024-03-01 MED ORDER — TRIAMCINOLONE ACETONIDE 10 MG/ML IJ SUSP
10.0000 mg | Freq: Once | INTRAMUSCULAR | Status: AC
  Administered 2024-03-01: 10 mg via INTRA_ARTICULAR

## 2024-03-01 NOTE — Patient Instructions (Signed)

## 2024-03-01 NOTE — Progress Notes (Unsigned)
 Setup date: 12/18/22

## 2024-03-02 ENCOUNTER — Telehealth (INDEPENDENT_AMBULATORY_CARE_PROVIDER_SITE_OTHER): Payer: Medicare HMO | Admitting: Adult Health

## 2024-03-02 DIAGNOSIS — G4733 Obstructive sleep apnea (adult) (pediatric): Secondary | ICD-10-CM | POA: Diagnosis not present

## 2024-03-02 NOTE — Progress Notes (Signed)
 PATIENT: Billy Fernandez DOB: [birth year]  REASON FOR VISIT: follow up HISTORY FROM: patient PRIMARY NEUROLOGIST: Dr. Frances Furbish  Virtual Visit via Video Note  I connected with Billy Fernandez on 03/02/24 at  1:00 PM EDT by a video enabled telemedicine application located remotely at [facility] and verified that I am speaking with the correct person using two identifiers who was located at their own home.   I discussed the limitations of evaluation and management by telemedicine and the availability of in person appointments. The patient expressed understanding and agreed to proceed.   HISTORY OF PRESENT ILLNESS: Today 03/02/24:  Billy Fernandez is a 73 y.o. male with a history of OSA on CPAP. Returns today for follow-up.  Reports that CPAP is working well.  Denies any new issues.  Continues to find it beneficial.  Download is below      HISTORY:  02/24/2023: I reviewed his [employer] compliance data from 01/21/2023 through 02/19/2023, which is a total of 30 days, during which time he used his machine every night with percent use days greater than 4 hours at 97%, indicating excellent compliance with an average usage of 7 hours and 39 minutes, residual AHI at goal at 2.2/h, leak acceptable with the 95th percentile at 3.1 L/min, 95th percentile of pressure of 13.9 cm with a range of 7 to 14 cm.  He reports doing rather well, feeling better rested. Wife reports that he has no snoring and sleeps more quieter, machine is also very quiet, which was very surprising to her. He is very pleased and highly motivated to continue with treatment. Per his smart watch, he had rare episodes of A fib. He has a FU with cardiology scheduled for June. He had some halitosis, per wife, but this is better, had a checkup with dentistry. He has some mouth dryness and intends to try a level higher than 4 with the humidity. He uses the F20 Airtouch FFM from [facility] and is happy with it.     Mr.  Fernandez is a 73 year old male with an underlying medical history of hypertension, hyperlipidemia, type 2 diabetes, cervical radiculopathy, coronary calcification, paroxysmal A-fib and overweight state, who reports no significant sleep related symptoms other than occasional sleep disruption from nocturia, he is noted to have mild, intermittent snoring per wife's report. He has been diagnosed recently with A-fib and blood pressure has been fluctuating but has improved since he was started on new medications. He has had palpitations in the past, recalls that at times of stress he has had palpitations, particularly when he went through his divorce. He has a follow-up with you soon, has had cardiac work-up including Zio patch, echocardiogram, Lexiscan. I reviewed your office note from 10/11/2022. His Epworth sleepiness score is 4 out of 24, fatigue severity score is 13 out of 63. He wakes up fairly well rested, he has a bedtime of around 9 PM and rise time around 5 or 6 AM. He is retired, he used to work as a Surveyor, minerals. He lives with his wife, they have no pets in the household. He has reduced his caffeine intake from 3 to 4 cups of coffee per day to 1 cup of coffee per day. He drinks tea occasionally with a meal but not daily. He denies recurrent nocturnal or morning headaches. He has nocturia 1 time or less than that on some nights. He quit smoking in the late 90s. He drinks alcohol occasionally, once or twice a month. They do have a TV  in the bedroom but watches very little, turn it off before falling asleep. He is pursuing some lifestyle changes, he has lost about 10 pounds in the past month. He has no family history of sleep apnea.   REVIEW OF SYSTEMS: Out of a complete 14 system review of symptoms, the patient complains only of the following symptoms, and all other reviewed systems are negative.  FSS ESS  ALLERGIES: No Known Allergies  HOME MEDICATIONS: Outpatient Medications Prior to Visit   Medication Sig Dispense Refill   apixaban (ELIQUIS) 5 MG TABS tablet Take 1 tablet (5 mg total) by mouth 2 (two) times daily.     atorvastatin (LIPITOR) 20 MG tablet Take 1 tablet (20 mg total) by mouth daily. 90 tablet 2   Blood Glucose Calibration (ONETOUCH VERIO) SOLN Use as directed 3 each 3   blood glucose meter kit and supplies KIT Dispense based on patient and insurance preference 1 each 0   Blood Glucose Monitoring Suppl (ONETOUCH VERIO REFLECT) w/Device KIT USE TO CHECK BLOOD GLUCOSE DAILY 1 kit 0   calcium carbonate (TUMS) 500 MG chewable tablet Chew 2 tablets by mouth at bedtime.     carvedilol (COREG) 6.25 MG tablet Take 1 tablet (6.25 mg total) by mouth 2 (two) times daily. 180 tablet 2   CINNAMON PO Take 1,000 mg by mouth daily.     Continuous Glucose Receiver (DEXCOM G7 RECEIVER) DEVI UAD to monitor sugars. E11.9 1 each 0   Continuous Glucose Sensor (DEXCOM G7 SENSOR) MISC UAD to monitor sugars. E11.9 3 each 5   fish oil-omega-3 fatty acids 1000 MG capsule Take 2 g by mouth daily.     Lancets (FREESTYLE) lancets Use to check blood sugars. E11.9 100 each 1   Lancets (ONETOUCH DELICA PLUS LANCET30G) MISC USE TO CHECK BLOOD SUGARS ONCE A DAY 100 each 3   losartan-hydrochlorothiazide (HYZAAR) 100-12.5 MG tablet Take 1 tablet by mouth daily. 90 tablet 1   metFORMIN (GLUCOPHAGE) 1000 MG tablet Take 1 tablet (1,000 mg total) by mouth 2 (two) times daily with a meal. 180 tablet 2   ONETOUCH VERIO test strip 1 EACH BY OTHER ROUTE DAILY. 100 strip 2   No facility-administered medications prior to visit.    PAST MEDICAL HISTORY: Past Medical History:  Diagnosis Date   Cervical radiculopathy at C6 04/14/2020   Essential hypertension, benign 05/28/2013   Hyperlipidemia 06/29/2013   Type 2 diabetes mellitus 07/01/2014    PAST SURGICAL HISTORY: No past surgical history on file.  FAMILY HISTORY: Family History  Problem Relation Age of Onset   Breast cancer Mother    Cancer Mother     Diabetes Mother    Heart disease Father    Hyperlipidemia Father    Hypertension Father    Drug abuse Sister    Diabetes Brother    Heart disease Brother        cabg   Dementia Paternal Grandmother        Possible Alzheimer's disease   Alcohol abuse Neg Hx    COPD Neg Hx    Depression Neg Hx    Early death Neg Hx    Hearing loss Neg Hx    Kidney disease Neg Hx    Stroke Neg Hx     SOCIAL HISTORY: Social History   Socioeconomic History   Marital status: Married    Spouse name: Not on file   Number of children: 2   Years of education: 12   Highest education level:  12th grade  Occupational History   Occupation: Retired   Occupation: retired Theatre stage manager  Tobacco Use   Smoking status: Former    Current packs/day: 0.00    Average packs/day: 0.5 packs/day for 25.0 years (12.5 ttl pk-yrs)    Types: Cigarettes    Start date: 05/07/1956    Quit date: 05/07/1981    Years since quitting: 42.8   Smokeless tobacco: Never  Vaping Use   Vaping status: Never Used  Substance and Sexual Activity   Alcohol use: Yes    Alcohol/week: 1.0 standard drink of alcohol    Types: 1 Cans of beer per week    Comment: occasionally/rarely   Drug use: Not Currently   Sexual activity: Yes  Other Topics Concern   Not on file  Social History Narrative   Lives in 2 story home with wife   They enjoy travelling and exercising - very independent and active   Social Drivers of Corporate investment banker Strain: Low Risk  (10/17/2023)   Overall Financial Resource Strain (CARDIA)    Difficulty of Paying Living Expenses: Not hard at all  Food Insecurity: No Food Insecurity (10/17/2023)   Hunger Vital Sign    Worried About Running Out of Food in the Last Year: Never true    Ran Out of Food in the Last Year: Never true  Transportation Needs: No Transportation Needs (10/17/2023)   PRAPARE - Administrator, Civil Service (Medical): No    Lack of Transportation (Non-Medical): No   Physical Activity: Insufficiently Active (10/17/2023)   Exercise Vital Sign    Days of Exercise per Week: 3 days    Minutes of Exercise per Session: 30 min  Stress: No Stress Concern Present (10/17/2023)   [employer] of Occupational Health - Occupational Stress Questionnaire    Feeling of Stress : Not at all  Social Connections: Socially Integrated (10/17/2023)   Social Connection and Isolation Panel [NHANES]    Frequency of Communication with Friends and Family: Twice a week    Frequency of Social Gatherings with Friends and Family: Once a week    Attends Religious Services: More than 4 times per year    Active Member of [employer] or Organizations: Yes    Attends Banker Meetings: 1 to 4 times per year    Marital Status: Married  Catering manager Violence: Not At Risk (04/05/2022)   Humiliation, Afraid, Rape, and Kick questionnaire    Fear of Current or Ex-Partner: No    Emotionally Abused: No    Physically Abused: No    Sexually Abused: No      PHYSICAL EXAM   Generalized: Well developed, in no acute distress  Chest: Lungs clear to auscultation bilaterally  Neurological examination  Mentation: Alert oriented to time, place, history taking. Follows all commands speech and language fluent Cranial nerve II-XII: Facial symmetry noted  DIAGNOSTIC DATA (LABS, IMAGING, TESTING) - I reviewed patient records, labs, notes, testing and imaging myself where available.  Lab Results  Component Value Date   WBC 6.5 10/21/2023   HGB 16.0 10/21/2023   HCT 48.8 10/21/2023   MCV 90.9 10/21/2023   PLT 243.0 10/21/2023      Component Value Date/Time   NA 142 10/21/2023 0911   NA 143 12/25/2018 0913   K 4.1 10/21/2023 0911   CL 105 10/21/2023 0911   CO2 29 10/21/2023 0911   GLUCOSE 103 (H) 10/21/2023 0911   BUN 24 (H) 10/21/2023 0911  BUN 21 12/25/2018 0913   CREATININE 0.99 10/21/2023 0911   CALCIUM 9.7 10/21/2023 0911   PROT 7.3 10/21/2023 0911   PROT 6.8  12/25/2018 0913   ALBUMIN 4.4 10/21/2023 0911   ALBUMIN 4.4 12/25/2018 0913   AST 20 10/21/2023 0911   ALT 20 10/21/2023 0911   ALKPHOS 35 (L) 10/21/2023 0911   BILITOT 0.8 10/21/2023 0911   BILITOT 0.9 12/25/2018 0913   GFRNONAA 67 12/25/2018 0913   GFRAA 78 12/25/2018 0913   Lab Results  Component Value Date   CHOL 136 10/21/2023   HDL 52.60 10/21/2023   LDLCALC 63 10/21/2023   TRIG 103.0 10/21/2023   CHOLHDL 3 10/21/2023   Lab Results  Component Value Date   HGBA1C 6.7 (H) 10/21/2023   Lab Results  Component Value Date   VITAMINB12 296 05/28/2013   Lab Results  Component Value Date   TSH 1.12 04/21/2023      ASSESSMENT AND PLAN 73 y.o. year old male  has a past medical history of Cervical radiculopathy at C6 (04/14/2020), Essential hypertension, benign (05/28/2013), Hyperlipidemia (06/29/2013), and Type 2 diabetes mellitus (07/01/2014). here with:  OSA on CPAP  - CPAP compliance excellent - Good treatment of AHI  - Encourage patient to use CPAP nightly and > 4 hours each night - F/U in 1 year or sooner if needed   Butch Penny, MSN, NP-C 03/02/2024, 12:44 PM [facility] [address] [place], [place] [address] [phone]

## 2024-03-03 NOTE — Progress Notes (Signed)
 Subjective:   Patient ID: Billy Fernandez, male   DOB: 73 y.o.   MRN: [id number]   HPI Patient states that he has pain in the bottom of his right heel that has bothered him and also is developing a secondary problem his Achilles tendon.  He is concerned with his activity levels   ROS      Objective:  Physical Exam  Neurovascular status intact with inflammation pain of the plantar fascia right at the insertional point tendon calcaneus fluid buildup around the medial and central band with the patient also noted to have discomfort in the Achilles tendon area more in the muscle tendon junction     Assessment:  Plantar fasciitis right inflammation noted along with Achilles tendinitis more at the muscle tendon junction      Plan:  H&P reviewed both conditions separately.  I did give him instructions today on stretching of the Achilles and the proper usage of heel lift ice therapy.  I did do careful injection plantar fascia tolerated well 3 mg Dexasone Kenalog 5 mg Xylocaine

## 2024-03-16 DIAGNOSIS — D0472 Carcinoma in situ of skin of left lower limb, including hip: Secondary | ICD-10-CM | POA: Diagnosis not present

## 2024-03-16 DIAGNOSIS — D485 Neoplasm of uncertain behavior of skin: Secondary | ICD-10-CM | POA: Diagnosis not present

## 2024-03-21 DIAGNOSIS — G4733 Obstructive sleep apnea (adult) (pediatric): Secondary | ICD-10-CM | POA: Diagnosis not present

## 2024-04-19 NOTE — Patient Instructions (Addendum)
 Blood work was ordered.       Medications changes include :   None    Return in about 6 months (around 10/20/2024) for follow up.    Health Maintenance, Male Adopting a healthy lifestyle and getting preventive care are important in promoting health and wellness. Ask your health care provider about: The right schedule for you to have regular tests and exams. Things you can do on your own to prevent diseases and keep yourself healthy. What should I know about diet, weight, and exercise? Eat a healthy diet  Eat a diet that includes plenty of vegetables, fruits, low-fat dairy products, and lean protein. Do not eat a lot of foods that are high in solid fats, added sugars, or sodium. Maintain a healthy weight Body mass index (BMI) is a measurement that can be used to identify possible weight problems. It estimates body fat based on height and weight. Your health care provider can help determine your BMI and help you achieve or maintain a healthy weight. Get regular exercise Get regular exercise. This is one of the most important things you can do for your health. Most adults should: Exercise for at least 150 minutes each week. The exercise should increase your heart rate and make you sweat (moderate-intensity exercise). Do strengthening exercises at least twice a week. This is in addition to the moderate-intensity exercise. Spend less time sitting. Even light physical activity can be beneficial. Watch cholesterol and blood lipids Have your blood tested for lipids and cholesterol at 72 years of age, then have this test every 5 years. You may need to have your cholesterol levels checked more often if: Your lipid or cholesterol levels are high. You are older than 72 years of age. You are at high risk for heart disease. What should I know about cancer screening? Many types of cancers can be detected early and may often be prevented. Depending on your health history and family  history, you may need to have cancer screening at various ages. This may include screening for: Colorectal cancer. Prostate cancer. Skin cancer. Lung cancer. What should I know about heart disease, diabetes, and high blood pressure? Blood pressure and heart disease High blood pressure causes heart disease and increases the risk of stroke. This is more likely to develop in people who have high blood pressure readings or are overweight. Talk with your health care provider about your target blood pressure readings. Have your blood pressure checked: Every 3-5 years if you are 4-38 years of age. Every year if you are 72 years old or older. If you are between the ages of 68 and 34 and are a current or former smoker, ask your health care provider if you should have a one-time screening for abdominal aortic aneurysm (AAA). Diabetes Have regular diabetes screenings. This checks your fasting blood sugar level. Have the screening done: Once every three years after age 16 if you are at a normal weight and have a low risk for diabetes. More often and at a younger age if you are overweight or have a high risk for diabetes. What should I know about preventing infection? Hepatitis B If you have a higher risk for hepatitis B, you should be screened for this virus. Talk with your health care provider to find out if you are at risk for hepatitis B infection. Hepatitis C Blood testing is recommended for: Everyone born from [birth year] through 1964. Anyone with known risk factors for hepatitis C. Sexually transmitted  infections (STIs) You should be screened each year for STIs, including gonorrhea and chlamydia, if: You are sexually active and are younger than 71 years of age. You are older than 72 years of age and your health care provider tells you that you are at risk for this type of infection. Your sexual activity has changed since you were last screened, and you are at increased risk for chlamydia or  gonorrhea. Ask your health care provider if you are at risk. Ask your health care provider about whether you are at high risk for HIV. Your health care provider may recommend a prescription medicine to help prevent HIV infection. If you choose to take medicine to prevent HIV, you should first get tested for HIV. You should then be tested every 3 months for as long as you are taking the medicine. Follow these instructions at home: Alcohol use Do not drink alcohol if your health care provider tells you not to drink. If you drink alcohol: Limit how much you have to 0-2 drinks a day. Know how much alcohol is in your drink. In the [place] one drink equals one 12 oz bottle of beer (355 mL), one 5 oz glass of wine (148 mL), or one 1 oz glass of hard liquor (44 mL). Lifestyle Do not use any products that contain nicotine or tobacco. These products include cigarettes, chewing tobacco, and vaping devices, such as e-cigarettes. If you need help quitting, ask your health care provider. Do not use street drugs. Do not share needles. Ask your health care provider for help if you need support or information about quitting drugs. General instructions Schedule regular health, dental, and eye exams. Stay current with your vaccines. Tell your health care provider if: You often feel depressed. You have ever been abused or do not feel safe at home. Summary Adopting a healthy lifestyle and getting preventive care are important in promoting health and wellness. Follow your health care provider's instructions about healthy diet, exercising, and getting tested or screened for diseases. Follow your health care provider's instructions on monitoring your cholesterol and blood pressure. This information is not intended to replace advice given to you by your health care provider. Make sure you discuss any questions you have with your health care provider. Document Revised: 04/30/2021 Document Reviewed: 04/30/2021 Elsevier  Patient Education  2024 [employer].

## 2024-04-19 NOTE — Progress Notes (Unsigned)
      Subjective:    Patient ID: Billy Fernandez, male    DOB: 12-May-1951, 73 y.o.   MRN: [id number]     HPI Billy Fernandez is here for follow up of his chronic medical problems.    Medications and allergies reviewed with patient and updated if appropriate.  Current Outpatient Medications on File Prior to Visit  Medication Sig Dispense Refill   apixaban  (ELIQUIS ) 5 MG TABS tablet Take 1 tablet (5 mg total) by mouth 2 (two) times daily.     atorvastatin  (LIPITOR) 20 MG tablet Take 1 tablet (20 mg total) by mouth daily. 90 tablet 2   Blood Glucose Calibration (ONETOUCH VERIO) SOLN Use as directed 3 each 3   blood glucose meter kit and supplies KIT Dispense based on patient and insurance preference 1 each 0   Blood Glucose Monitoring Suppl (ONETOUCH VERIO REFLECT) w/Device KIT USE TO CHECK BLOOD GLUCOSE DAILY 1 kit 0   calcium  carbonate (TUMS) 500 MG chewable tablet Chew 2 tablets by mouth at bedtime.     carvedilol  (COREG ) 6.25 MG tablet Take 1 tablet (6.25 mg total) by mouth 2 (two) times daily. 180 tablet 2   CINNAMON PO Take 1,000 mg by mouth daily.     Continuous Glucose Receiver (DEXCOM G7 RECEIVER) DEVI UAD to monitor sugars. E11.9 1 each 0   Continuous Glucose Sensor (DEXCOM G7 SENSOR) MISC UAD to monitor sugars. E11.9 3 each 5   fish oil-omega-3 fatty acids 1000 MG capsule Take 2 g by mouth daily.     Lancets (FREESTYLE) lancets Use to check blood sugars. E11.9 100 each 1   Lancets (ONETOUCH DELICA PLUS LANCET30G) MISC USE TO CHECK BLOOD SUGARS ONCE A DAY 100 each 3   losartan -hydrochlorothiazide  (HYZAAR) 100-12.5 MG tablet Take 1 tablet by mouth daily. 90 tablet 1   metFORMIN  (GLUCOPHAGE ) 1000 MG tablet Take 1 tablet (1,000 mg total) by mouth 2 (two) times daily with a meal. 180 tablet 2   ONETOUCH VERIO test strip 1 EACH BY OTHER ROUTE DAILY. 100 strip 2   No current facility-administered medications on file prior to visit.     Review of Systems     Objective:  There were  no vitals filed for this visit. BP Readings from Last 3 Encounters:  10/21/23 130/76  05/27/23 118/76  04/21/23 126/80   Wt Readings from Last 3 Encounters:  10/21/23 193 lb (87.5 kg)  07/01/23 191 lb (86.6 kg)  05/27/23 196 lb (88.9 kg)   There is no height or weight on file to calculate BMI.    Physical Exam     Lab Results  Component Value Date   WBC 6.5 10/21/2023   HGB 16.0 10/21/2023   HCT 48.8 10/21/2023   PLT 243.0 10/21/2023   GLUCOSE 103 (H) 10/21/2023   CHOL 136 10/21/2023   TRIG 103.0 10/21/2023   HDL 52.60 10/21/2023   LDLCALC 63 10/21/2023   ALT 20 10/21/2023   AST 20 10/21/2023   NA 142 10/21/2023   K 4.1 10/21/2023   CL 105 10/21/2023   CREATININE 0.99 10/21/2023   BUN 24 (H) 10/21/2023   CO2 29 10/21/2023   TSH 1.12 04/21/2023   PSA 1.48 04/21/2023   HGBA1C 6.7 (H) 10/21/2023   MICROALBUR <0.7 04/21/2023     Assessment & Plan:    See Problem List for Assessment and Plan of chronic medical problems.

## 2024-04-20 ENCOUNTER — Encounter: Payer: Self-pay | Admitting: Internal Medicine

## 2024-04-20 ENCOUNTER — Ambulatory Visit (INDEPENDENT_AMBULATORY_CARE_PROVIDER_SITE_OTHER): Payer: Medicare HMO | Admitting: Internal Medicine

## 2024-04-20 VITALS — BP 120/70 | HR 50 | Temp 97.8°F | Ht 70.0 in | Wt 193.0 lb

## 2024-04-20 DIAGNOSIS — I2510 Atherosclerotic heart disease of native coronary artery without angina pectoris: Secondary | ICD-10-CM

## 2024-04-20 DIAGNOSIS — E782 Mixed hyperlipidemia: Secondary | ICD-10-CM

## 2024-04-20 DIAGNOSIS — E119 Type 2 diabetes mellitus without complications: Secondary | ICD-10-CM

## 2024-04-20 DIAGNOSIS — Z125 Encounter for screening for malignant neoplasm of prostate: Secondary | ICD-10-CM | POA: Diagnosis not present

## 2024-04-20 DIAGNOSIS — Z7984 Long term (current) use of oral hypoglycemic drugs: Secondary | ICD-10-CM

## 2024-04-20 DIAGNOSIS — I10 Essential (primary) hypertension: Secondary | ICD-10-CM | POA: Diagnosis not present

## 2024-04-20 DIAGNOSIS — Z85828 Personal history of other malignant neoplasm of skin: Secondary | ICD-10-CM

## 2024-04-20 DIAGNOSIS — Z0000 Encounter for general adult medical examination without abnormal findings: Secondary | ICD-10-CM | POA: Diagnosis not present

## 2024-04-20 DIAGNOSIS — I480 Paroxysmal atrial fibrillation: Secondary | ICD-10-CM

## 2024-04-20 DIAGNOSIS — M722 Plantar fascial fibromatosis: Secondary | ICD-10-CM

## 2024-04-20 DIAGNOSIS — M7661 Achilles tendinitis, right leg: Secondary | ICD-10-CM

## 2024-04-20 LAB — COMPREHENSIVE METABOLIC PANEL WITH GFR
ALT: 19 U/L (ref 0–53)
AST: 18 U/L (ref 0–37)
Albumin: 4.5 g/dL (ref 3.5–5.2)
Alkaline Phosphatase: 35 U/L — ABNORMAL LOW (ref 39–117)
BUN: 24 mg/dL — ABNORMAL HIGH (ref 6–23)
CO2: 30 meq/L (ref 19–32)
Calcium: 9.7 mg/dL (ref 8.4–10.5)
Chloride: 102 meq/L (ref 96–112)
Creatinine, Ser: 0.98 mg/dL (ref 0.40–1.50)
GFR: 77.18 mL/min (ref >60.00)
Glucose, Bld: 103 mg/dL — ABNORMAL HIGH (ref 70–99)
Potassium: 4.6 meq/L (ref 3.5–5.1)
Sodium: 139 meq/L (ref 135–145)
Total Bilirubin: 1.1 mg/dL (ref 0.2–1.2)
Total Protein: 7.2 g/dL (ref 6.0–8.3)

## 2024-04-20 LAB — CBC WITH DIFFERENTIAL/PLATELET
Basophils Absolute: 0.1 10*3/uL (ref 0.0–0.1)
Basophils Relative: 0.8 % (ref 0.0–3.0)
Eosinophils Absolute: 0.8 10*3/uL — ABNORMAL HIGH (ref 0.0–0.7)
Eosinophils Relative: 12.8 % — ABNORMAL HIGH (ref 0.0–5.0)
HCT: 47.4 % (ref 39.0–52.0)
Hemoglobin: 15.7 g/dL (ref 13.0–17.0)
Lymphocytes Relative: 24.4 % (ref 12.0–46.0)
Lymphs Abs: 1.6 10*3/uL (ref 0.7–4.0)
MCHC: 33.1 g/dL (ref 30.0–36.0)
MCV: 91.2 fl (ref 78.0–100.0)
Monocytes Absolute: 0.6 10*3/uL (ref 0.1–1.0)
Monocytes Relative: 9 % (ref 3.0–12.0)
Neutro Abs: 3.4 10*3/uL (ref 1.4–7.7)
Neutrophils Relative %: 53 % (ref 43.0–77.0)
Platelets: 209 10*3/uL (ref 150.0–400.0)
RBC: 5.2 Mil/uL (ref 4.22–5.81)
RDW: 14.1 % (ref 11.5–15.5)
WBC: 6.4 10*3/uL (ref 4.0–10.5)

## 2024-04-20 LAB — LIPID PANEL
Cholesterol: 146 mg/dL (ref 0–200)
HDL: 55 mg/dL (ref >39.00)
LDL Cholesterol: 72 mg/dL (ref 0–99)
NonHDL: 91.22
Total CHOL/HDL Ratio: 3
Triglycerides: 97 mg/dL (ref 0.0–149.0)
VLDL: 19.4 mg/dL (ref 0.0–40.0)

## 2024-04-20 LAB — PSA, MEDICARE: PSA: 1.71 ng/mL (ref 0.10–4.00)

## 2024-04-20 LAB — MICROALBUMIN / CREATININE URINE RATIO
Creatinine,U: 78.8 mg/dL
Microalb Creat Ratio: UNDETERMINED mg/g (ref 0.0–30.0)
Microalb, Ur: <0.7 mg/dL

## 2024-04-20 LAB — HEMOGLOBIN A1C: Hgb A1c MFr Bld: 6.5 % (ref 4.6–6.5)

## 2024-04-20 NOTE — Assessment & Plan Note (Signed)
Chronic Following with cardiology Having intermittent afib On Eliquis 5 mg twice daily, Coreg 6.25 mg twice daily CBC, CMP

## 2024-04-20 NOTE — Assessment & Plan Note (Signed)
 Chronic CAC score 129  Following with cardiology On Eliquis  5 mg twice daily, atorvastatin  20 mg daily, Coreg  6.25 mg twice daily Lipids, CMP, CBC

## 2024-04-20 NOTE — Assessment & Plan Note (Signed)
 Chronic Blood pressure well controlled CMP, CBC Continue Coreg 6.25 mg twice daily, losartan-HCT 100-25 mg daily

## 2024-04-20 NOTE — Assessment & Plan Note (Signed)
 Right foot Following with podiatry Doing stretching at home

## 2024-04-20 NOTE — Progress Notes (Signed)
 Subjective:    Patient ID: Billy Fernandez, male    DOB: Jul 15, 1951, 73 y.o.   MRN: [id number]     HPI Billy Fernandez is here for a physical exam and his chronic medical problems.   Using dexcom - it has helped him change his diet.  He is no longer eating several foods because of the Dexcom   Medications and allergies reviewed with patient and updated if appropriate.  Current Outpatient Medications on File Prior to Visit  Medication Sig Dispense Refill   apixaban  (ELIQUIS ) 5 MG TABS tablet Take 1 tablet (5 mg total) by mouth 2 (two) times daily.     atorvastatin  (LIPITOR) 20 MG tablet Take 1 tablet (20 mg total) by mouth daily. 90 tablet 2   blood glucose meter kit and supplies KIT Dispense based on patient and insurance preference 1 each 0   carvedilol  (COREG ) 6.25 MG tablet Take 1 tablet (6.25 mg total) by mouth 2 (two) times daily. 180 tablet 2   CINNAMON PO Take 1,000 mg by mouth daily.     Continuous Glucose Receiver (DEXCOM G7 RECEIVER) DEVI UAD to monitor sugars. E11.9 1 each 0   Continuous Glucose Sensor (DEXCOM G7 SENSOR) MISC UAD to monitor sugars. E11.9 3 each 5   fish oil-omega-3 fatty acids 1000 MG capsule Take 2 g by mouth daily.     fluorouracil (EFUDEX) 5 % cream Apply topically 2 (two) times daily.     losartan -hydrochlorothiazide  (HYZAAR) 100-12.5 MG tablet Take 1 tablet by mouth daily. 90 tablet 1   metFORMIN  (GLUCOPHAGE ) 1000 MG tablet Take 1 tablet (1,000 mg total) by mouth 2 (two) times daily with a meal. 180 tablet 2   No current facility-administered medications on file prior to visit.    Review of Systems  Constitutional:  Negative for fever.  HENT:  Positive for sneezing.   Eyes:  Positive for itching (allergy related). Negative for visual disturbance.  Respiratory:  Negative for cough, shortness of breath and wheezing.   Cardiovascular:  Positive for palpitations. Negative for chest pain and leg swelling.  Gastrointestinal:  Negative for abdominal pain,  blood in stool, constipation and diarrhea.       No gerd  Genitourinary:  Negative for difficulty urinating, dysuria and hematuria.  Musculoskeletal:  Positive for arthralgias (b/l thumbs). Negative for back pain.  Skin:  Negative for rash.  Neurological:  Negative for light-headedness and headaches.  Psychiatric/Behavioral:  Negative for dysphoric mood and sleep disturbance. The patient is not nervous/anxious.        Objective:   Vitals:   04/20/24 0932  BP: 120/70  Pulse: (!) 50  Temp: 97.8 F (36.6 C)  SpO2: 95%   Filed Weights   04/20/24 0932  Weight: 193 lb (87.5 kg)   Body mass index is 27.69 kg/m.  BP Readings from Last 3 Encounters:  04/20/24 120/70  10/21/23 130/76  05/27/23 118/76    Wt Readings from Last 3 Encounters:  04/20/24 193 lb (87.5 kg)  10/21/23 193 lb (87.5 kg)  07/01/23 191 lb (86.6 kg)      Physical Exam Constitutional: He appears well-developed and well-nourished. No distress.  HENT:  Head: Normocephalic and atraumatic.  Right Ear: External ear normal.  Left Ear: External ear normal.  Normal ear canals and TM b/l  Mouth/Throat: Oropharynx is clear and moist. Eyes: Conjunctivae and EOM are normal.  Neck: Neck supple. No tracheal deviation present. No thyromegaly present.  No carotid bruit  Cardiovascular: Normal rate, regular  rhythm, normal heart sounds and intact distal pulses.   No murmur heard.  No lower extremity edema. Pulmonary/Chest: Effort normal and breath sounds normal. No respiratory distress. He has no wheezes. He has no rales.  Abdominal: Soft. He exhibits no distension. There is no tenderness.  Genitourinary: deferred  Lymphadenopathy:   He has no cervical adenopathy.  Skin: Skin is warm and dry. He is not diaphoretic.  Psychiatric: He has a normal mood and affect. His behavior is normal.         Assessment & Plan:   Physical exam: Screening blood work  ordered Exercise   regular - weights, walking Weight  is  good Substance abuse   none   Reviewed recommended immunizations.   Health Maintenance  Topic Date Due   OPHTHALMOLOGY EXAM  11/09/2023   FOOT EXAM  03/18/2024   Diabetic kidney evaluation - Urine ACR  04/20/2024   HEMOGLOBIN A1C  04/20/2024   COVID-19 Vaccine (5 - 2024-25 season) 05/06/2024 (Originally 08/24/2023)   Medicare Annual Wellness (AWV)  06/30/2024   INFLUENZA VACCINE  07/23/2024   Diabetic kidney evaluation - eGFR measurement  10/20/2024   Colonoscopy  07/16/2027   DTaP/Tdap/Td (3 - Td or Tdap) 07/24/2027   Pneumonia Vaccine 18+ Years old  Completed   Hepatitis C Screening  Completed   Zoster Vaccines- Shingrix  Completed   HPV VACCINES  Aged Out   Meningococcal B Vaccine  Aged Out     See Problem List for Assessment and Plan of chronic medical problems.

## 2024-04-20 NOTE — Assessment & Plan Note (Signed)
 Right foot Following with podiatry

## 2024-04-20 NOTE — Assessment & Plan Note (Signed)
 Chronic Regular exercise and healthy diet  Check lipid panel  Continue atorvastatin  20 mg daily  Lab Results  Component Value Date   LDLCALC 63 10/21/2023

## 2024-04-20 NOTE — Assessment & Plan Note (Signed)
 New Following with dermatology

## 2024-04-20 NOTE — Assessment & Plan Note (Addendum)
 Chronic Lab Results  Component Value Date   HGBA1C 6.7 (H) 10/21/2023   Sugars controlled Using dexcom Continue metformin  1000 mg twice daily Continue diabetic diet, regular exercise He has significantly improved his lifestyle A1c, urine microalbumin

## 2024-05-20 DIAGNOSIS — D0472 Carcinoma in situ of skin of left lower limb, including hip: Secondary | ICD-10-CM | POA: Diagnosis not present

## 2024-05-20 DIAGNOSIS — L089 Local infection of the skin and subcutaneous tissue, unspecified: Secondary | ICD-10-CM | POA: Diagnosis not present

## 2024-05-26 ENCOUNTER — Ambulatory Visit: Payer: Medicare HMO | Admitting: Cardiology

## 2024-06-16 DIAGNOSIS — K080 Exfoliation of teeth due to systemic causes: Secondary | ICD-10-CM | POA: Diagnosis not present

## 2024-06-19 ENCOUNTER — Other Ambulatory Visit: Payer: Self-pay | Admitting: Internal Medicine

## 2024-06-19 DIAGNOSIS — E119 Type 2 diabetes mellitus without complications: Secondary | ICD-10-CM

## 2024-06-21 ENCOUNTER — Other Ambulatory Visit: Payer: Self-pay | Admitting: Internal Medicine

## 2024-06-23 ENCOUNTER — Other Ambulatory Visit: Payer: Self-pay

## 2024-06-23 ENCOUNTER — Encounter: Payer: Self-pay | Admitting: Internal Medicine

## 2024-06-23 DIAGNOSIS — E119 Type 2 diabetes mellitus without complications: Secondary | ICD-10-CM

## 2024-06-23 MED ORDER — MICROLET LANCETS MISC: 3 refills | Status: AC

## 2024-06-24 ENCOUNTER — Other Ambulatory Visit: Payer: Self-pay

## 2024-06-24 DIAGNOSIS — E119 Type 2 diabetes mellitus without complications: Secondary | ICD-10-CM

## 2024-06-24 MED ORDER — GLUCOSE METER TEST VI STRP: ORAL_STRIP | 12 refills | Status: AC

## 2024-07-22 DIAGNOSIS — Z86008 Personal history of in-situ neoplasm of other site: Secondary | ICD-10-CM | POA: Diagnosis not present

## 2024-07-22 DIAGNOSIS — Z85828 Personal history of other malignant neoplasm of skin: Secondary | ICD-10-CM | POA: Diagnosis not present

## 2024-07-22 DIAGNOSIS — L570 Actinic keratosis: Secondary | ICD-10-CM | POA: Diagnosis not present

## 2024-07-22 DIAGNOSIS — Z08 Encounter for follow-up examination after completed treatment for malignant neoplasm: Secondary | ICD-10-CM | POA: Diagnosis not present

## 2024-07-22 DIAGNOSIS — B353 Tinea pedis: Secondary | ICD-10-CM | POA: Diagnosis not present

## 2024-07-27 DIAGNOSIS — K080 Exfoliation of teeth due to systemic causes: Secondary | ICD-10-CM | POA: Diagnosis not present

## 2024-08-02 DIAGNOSIS — I480 Paroxysmal atrial fibrillation: Secondary | ICD-10-CM | POA: Diagnosis not present

## 2024-08-03 ENCOUNTER — Telehealth: Payer: Self-pay | Admitting: Internal Medicine

## 2024-08-03 NOTE — Telephone Encounter (Signed)
 Copied from CRM [id number]. Topic: Appointments - Appointment Cancel/Reschedule >> Aug 03, 2024  8:40 AM [name] wrote: Patient received phone call yesterday from insurance company - they asked patient a lot of questions regarding medical history, medications, providers, etc and wants to know if that would of been considered an annual wellness visit? If so, he wants to know if her annual wellness visit is necessary on 08/09/24? Please call her at [phone] (M)

## 2024-08-05 NOTE — Telephone Encounter (Unsigned)
 Copied from CRM [id number]. Topic: Appointments - Appointment Cancel/Reschedule >> Aug 05, 2024 10:45 AM [name] wrote: Patient wife called back and is  requesting a call back from Amy for clarification on whether its necessary to complete an AWV with her if patient has already completed this through the insurance. CB# [phone]

## 2024-08-05 NOTE — Telephone Encounter (Signed)
 Spoke with spouse today

## 2024-08-05 NOTE — Telephone Encounter (Signed)
 Copied from CRM [id number]. Topic: Appointments - Appointment Cancel/Reschedule >> Aug 03, 2024  8:40 AM [name] wrote: Patient received phone call yesterday from insurance company - they asked patient a lot of questions regarding medical history, medications, providers, etc and wants to know if that would of been considered an annual wellness visit? If so, he wants to know if her annual wellness visit is necessary on 08/09/24? Please call her at [phone] (M) >> Aug 05, 2024 10:45 AM [name] wrote: Patient wife called back and is  requesting a call back from Amy for clarification on whether its necessary to complete an AWV with her if patient has already completed this through the insurance. CB# [phone]

## 2024-08-09 ENCOUNTER — Ambulatory Visit

## 2024-09-07 ENCOUNTER — Other Ambulatory Visit: Payer: Self-pay | Admitting: Internal Medicine

## 2024-09-07 ENCOUNTER — Telehealth: Payer: Self-pay | Admitting: Radiology

## 2024-09-07 ENCOUNTER — Other Ambulatory Visit: Payer: Self-pay

## 2024-09-07 DIAGNOSIS — E119 Type 2 diabetes mellitus without complications: Secondary | ICD-10-CM

## 2024-09-07 MED ORDER — METFORMIN HCL 1000 MG PO TABS: 1000.0000 mg | ORAL_TABLET | Freq: Two times a day (BID) | ORAL | 2 refills | Status: AC

## 2024-09-07 MED ORDER — DEXCOM G7 SENSOR MISC: 5 refills | Status: DC

## 2024-09-07 MED ORDER — ATORVASTATIN CALCIUM 20 MG PO TABS: 20.0000 mg | ORAL_TABLET | Freq: Every day | ORAL | 2 refills | Status: AC

## 2024-09-07 NOTE — Telephone Encounter (Signed)
 Copied from CRM [id number]. Topic: Clinical - Prescription Issue >> Sep 07, 2024  1:33 PM Aleatha C wrote: Reason for CRM: Continuous Glucose Sensor (DEXCOM G7 SENSOR) MISC is suppose to be a 90 day supply not 30

## 2024-09-07 NOTE — Telephone Encounter (Signed)
 Copied from CRM [id number]. Topic: Clinical - Medication Refill >> Sep 07, 2024 10:06 AM Armenia J wrote: Medication:  metFORMIN  (GLUCOPHAGE ) 1000 MG tablet atorvastatin  (LIPITOR) 20 MG tablet Continuous Glucose Sensor (DEXCOM G7 SENSOR) MISC  Has the patient contacted their pharmacy? Yes (Agent: If no, request that the patient contact the pharmacy for the refill. If patient does not wish to contact the pharmacy document the reason why and proceed with request.) (Agent: If yes, when and what did the pharmacy advise?)  This is the patient's preferred pharmacy:  [facility] [address] ([address] [place] - [address] [place] ([place]) [place] [address] Phone: [phone] Fax: [phone]  Is this the correct pharmacy for this prescription? Yes If no, delete pharmacy and type the correct one.   Has the prescription been filled recently? No  Is the patient out of the medication? Yes  Has the patient been seen for an appointment in the last year OR does the patient have an upcoming appointment? Yes  Can we respond through MyChart? Yes  Agent: Please be advised that Rx refills may take up to 3 business days. We ask that you follow-up with your pharmacy.

## 2024-09-08 ENCOUNTER — Other Ambulatory Visit: Payer: Self-pay

## 2024-09-08 DIAGNOSIS — E119 Type 2 diabetes mellitus without complications: Secondary | ICD-10-CM

## 2024-09-08 MED ORDER — DEXCOM G7 SENSOR MISC: 3 refills | Status: DC

## 2024-10-04 ENCOUNTER — Encounter: Payer: Self-pay | Admitting: Podiatry

## 2024-10-04 ENCOUNTER — Encounter: Payer: Self-pay | Admitting: Internal Medicine

## 2024-10-04 ENCOUNTER — Other Ambulatory Visit: Payer: Self-pay | Admitting: Cardiology

## 2024-10-04 ENCOUNTER — Telehealth: Payer: Self-pay | Admitting: Cardiology

## 2024-10-04 ENCOUNTER — Ambulatory Visit: Admitting: Podiatry

## 2024-10-04 DIAGNOSIS — M7661 Achilles tendinitis, right leg: Secondary | ICD-10-CM | POA: Diagnosis not present

## 2024-10-04 DIAGNOSIS — I480 Paroxysmal atrial fibrillation: Secondary | ICD-10-CM

## 2024-10-04 MED ORDER — TRIAMCINOLONE ACETONIDE 10 MG/ML IJ SUSP
10.0000 mg | Freq: Once | INTRAMUSCULAR | Status: AC
  Administered 2024-10-04: 10 mg via INTRA_ARTICULAR

## 2024-10-04 NOTE — Patient Instructions (Signed)

## 2024-10-04 NOTE — Progress Notes (Signed)
 Subjective:   Patient ID: Billy Fernandez, male   DOB: 73 y.o.   MRN: [id number]   HPI Patient presents with caregiver with pain in the posterior aspect of the right heel with inflammation and states that it really did not get better since the last visit when you treated the bottom of my heel   ROS      Objective:  Physical Exam  Neurovascular status intact with significant improvement in the plantar heel but there is a lot of discomfort in the medial side of the right Achilles tendon insertion with no central or lateral involvement     Assessment:  Acute Achilles tendinitis right that is been present for a long time with improved plantar fasciitis     Plan:  H&P reviewed and at this point I have recommended careful injection for this and immobilization.  I went ahead and educated family on Achilles tendinitis I then reviewed x-ray did careful sterile prep after explaining risk and injected the medial side of the Achilles insertion 3 mg dexamethasone  Kenalog  5 mg Xylocaine applied sterile dressing and then applied air fracture walker with all instructions on usage and patient Billy be seen back in 4 weeks and Billy gradually start activity again in 3 weeks  X-rays indicate spur at the posterior aspect right heel slight increase in size over the last couple years signed visit signed visit

## 2024-10-04 NOTE — Telephone Encounter (Signed)
 Eliquis  refill pending from today at 943am. This is a duplicate request. Also, 90 day refill was sent per original request from another user.

## 2024-10-04 NOTE — Telephone Encounter (Signed)
 Reason for walk-in: Walk-in Reasons: medication refill Patient need a refill of Eliquis  5mg   If patient is requesting to be seen today, or if patient is having symptoms:  What symptoms are being reported (if any)?  N/a  Route to triage pool and ensure Teams message has been sent to the Triage [facility] chat.  3.   For medication samples, medication refills, HIM requests, appointment requests, lab-related requests, or form/record drop-off, please route to the appropriate pool.

## 2024-10-04 NOTE — Telephone Encounter (Signed)
 Prescription refill request for Eliquis  received. Indication: PAF Last office visit: 05/27/23  Billy Bergamo MD Scr: 0.98 on 04/20/24  Epic Age: 73 Weight: 88.9kg  Based on above findings Eliquis  5mg  twice daily is the appropriate dose.  Refill approved.

## 2024-10-20 ENCOUNTER — Ambulatory Visit: Admitting: Internal Medicine

## 2024-10-20 ENCOUNTER — Ambulatory Visit

## 2024-11-02 ENCOUNTER — Encounter: Payer: Self-pay | Admitting: Internal Medicine

## 2024-11-03 ENCOUNTER — Other Ambulatory Visit: Payer: Self-pay

## 2024-11-03 DIAGNOSIS — I10 Essential (primary) hypertension: Secondary | ICD-10-CM

## 2024-11-03 MED ORDER — LOSARTAN POTASSIUM-HCTZ 100-12.5 MG PO TABS: 1.0000 | ORAL_TABLET | Freq: Every day | ORAL | 1 refills | Status: DC

## 2024-11-04 ENCOUNTER — Encounter: Payer: Self-pay | Admitting: Podiatry

## 2024-11-04 ENCOUNTER — Ambulatory Visit: Admitting: Podiatry

## 2024-11-04 DIAGNOSIS — M7661 Achilles tendinitis, right leg: Secondary | ICD-10-CM

## 2024-11-04 DIAGNOSIS — M722 Plantar fascial fibromatosis: Secondary | ICD-10-CM

## 2024-11-06 NOTE — Progress Notes (Signed)
 Subjective:   Patient ID: Billy Fernandez, male   DOB: 73 y.o.   MRN: [id number]   HPI The patient presents stating I am doing quite a bit better and having minimal discomfort now in the heel.  Patient states that he has been wearing the boot doing exercises and using ice   ROS      Objective:  Physical Exam  Vascular status intact significant diminishment of discomfort of the posterior right heel mild discomfort still noted upon deep palpation     Assessment:  Achilles tendinitis that is improving so far right     Plan:  H&P and reviewed condition and discussed the continuation of stretch exercises ice and we reviewed reduction of oral anti-inflammatories and boot usage on a minor basis.  If symptoms recur he Billy be seen back

## 2024-11-11 DIAGNOSIS — H524 Presbyopia: Secondary | ICD-10-CM | POA: Diagnosis not present

## 2024-11-11 DIAGNOSIS — E119 Type 2 diabetes mellitus without complications: Secondary | ICD-10-CM | POA: Diagnosis not present

## 2024-11-11 LAB — OPHTHALMOLOGY REPORT-SCANNED

## 2024-12-04 MED ORDER — APIXABAN 5 MG PO TABS: 5.0000 mg | ORAL_TABLET | Freq: Two times a day (BID) | ORAL | 1 refills | Status: AC

## 2024-12-25 ENCOUNTER — Other Ambulatory Visit: Payer: Self-pay | Admitting: Internal Medicine

## 2024-12-25 ENCOUNTER — Encounter: Payer: Self-pay | Admitting: Internal Medicine

## 2024-12-25 DIAGNOSIS — E119 Type 2 diabetes mellitus without complications: Secondary | ICD-10-CM

## 2024-12-27 ENCOUNTER — Other Ambulatory Visit: Payer: Self-pay

## 2024-12-27 DIAGNOSIS — E119 Type 2 diabetes mellitus without complications: Secondary | ICD-10-CM

## 2024-12-27 MED ORDER — DEXCOM G7 RECEIVER DEVI: 0 refills | Status: AC

## 2024-12-29 ENCOUNTER — Other Ambulatory Visit: Payer: Self-pay

## 2024-12-29 DIAGNOSIS — E119 Type 2 diabetes mellitus without complications: Secondary | ICD-10-CM

## 2024-12-29 MED ORDER — ACCU-CHEK GUIDE W/DEVICE KIT: 1.0000 | PACK | 0 refills | Status: AC

## 2024-12-29 MED ORDER — ACCU-CHEK GUIDE TEST VI STRP: ORAL_STRIP | 12 refills | Status: AC

## 2024-12-29 MED ORDER — ACCU-CHEK SOFTCLIX LANCETS MISC: 12 refills | Status: AC

## 2024-12-31 ENCOUNTER — Ambulatory Visit: Admitting: Internal Medicine

## 2025-01-11 ENCOUNTER — Encounter: Payer: Self-pay | Admitting: Internal Medicine

## 2025-01-21 ENCOUNTER — Other Ambulatory Visit: Payer: Self-pay | Admitting: Internal Medicine

## 2025-01-21 DIAGNOSIS — I10 Essential (primary) hypertension: Secondary | ICD-10-CM

## 2025-02-09 ENCOUNTER — Ambulatory Visit: Admitting: Internal Medicine

## 2025-03-01 ENCOUNTER — Telehealth: Admitting: Adult Health

## 2025-04-22 ENCOUNTER — Ambulatory Visit

## 2025-04-25 ENCOUNTER — Encounter: Admitting: Internal Medicine
